# Patient Record
Sex: Female | Born: 1955 | Race: White | Hispanic: No | Marital: Single | Smoking: Former smoker
Health system: Southern US, Community
[De-identification: ages and names within clinical notes are randomized; demographics above are authoritative.]

## PROBLEM LIST (undated history)

## (undated) DIAGNOSIS — I4891 Unspecified atrial fibrillation: Secondary | ICD-10-CM

## (undated) DIAGNOSIS — I2510 Atherosclerotic heart disease of native coronary artery without angina pectoris: Secondary | ICD-10-CM

## (undated) DIAGNOSIS — C801 Malignant (primary) neoplasm, unspecified: Secondary | ICD-10-CM

## (undated) DIAGNOSIS — G4730 Sleep apnea, unspecified: Secondary | ICD-10-CM

## (undated) DIAGNOSIS — G839 Paralytic syndrome, unspecified: Secondary | ICD-10-CM

## (undated) DIAGNOSIS — M21379 Foot drop, unspecified foot: Secondary | ICD-10-CM

## (undated) DIAGNOSIS — I10 Essential (primary) hypertension: Secondary | ICD-10-CM

## (undated) DIAGNOSIS — F419 Anxiety disorder, unspecified: Secondary | ICD-10-CM

## (undated) DIAGNOSIS — Q249 Congenital malformation of heart, unspecified: Secondary | ICD-10-CM

## (undated) DIAGNOSIS — M21372 Foot drop, left foot: Secondary | ICD-10-CM

## (undated) DIAGNOSIS — Z9889 Other specified postprocedural states: Secondary | ICD-10-CM

## (undated) DIAGNOSIS — M1990 Unspecified osteoarthritis, unspecified site: Secondary | ICD-10-CM

## (undated) DIAGNOSIS — R569 Unspecified convulsions: Secondary | ICD-10-CM

## (undated) DIAGNOSIS — I2699 Other pulmonary embolism without acute cor pulmonale: Secondary | ICD-10-CM

## (undated) DIAGNOSIS — I639 Cerebral infarction, unspecified: Secondary | ICD-10-CM

## (undated) DIAGNOSIS — R112 Nausea with vomiting, unspecified: Secondary | ICD-10-CM

## (undated) HISTORY — PX: OPEN REDUCTION INTERNAL FIXATION (ORIF) HAND: SHX5991

## (undated) HISTORY — PX: CERVICAL CONE BIOPSY: SUR198

## (undated) HISTORY — PX: LEG SURGERY: SHX1003

## (undated) HISTORY — DX: Other pulmonary embolism without acute cor pulmonale: I26.99

## (undated) HISTORY — PX: DILATION AND CURETTAGE OF UTERUS: SHX78

## (undated) HISTORY — PX: TONSILLECTOMY: SUR1361

## (undated) HISTORY — PX: BREAST SURGERY: SHX581

## (undated) HISTORY — DX: Unspecified atrial fibrillation: I48.91

---

## 2004-08-10 HISTORY — PX: COLONOSCOPY: SHX174

## 2004-11-11 ENCOUNTER — Ambulatory Visit: Payer: Self-pay | Admitting: Psychiatry

## 2008-03-23 ENCOUNTER — Other Ambulatory Visit: Admission: RE | Admit: 2008-03-23 | Discharge: 2008-03-23 | Payer: Self-pay | Admitting: Obstetrics and Gynecology

## 2011-03-26 ENCOUNTER — Emergency Department (HOSPITAL_COMMUNITY): Payer: Medicare Other

## 2011-03-26 ENCOUNTER — Emergency Department (HOSPITAL_COMMUNITY)
Admission: EM | Admit: 2011-03-26 | Discharge: 2011-03-26 | Disposition: A | Discharge status: Home / Self Care | Payer: Medicare Other | Attending: Emergency Medicine | Admitting: Emergency Medicine

## 2011-03-26 ENCOUNTER — Encounter: Payer: Self-pay | Admitting: Emergency Medicine

## 2011-03-26 DIAGNOSIS — M2450 Contracture, unspecified joint: Secondary | ICD-10-CM | POA: Insufficient documentation

## 2011-03-26 DIAGNOSIS — R51 Headache: Secondary | ICD-10-CM | POA: Insufficient documentation

## 2011-03-26 DIAGNOSIS — Y92009 Unspecified place in unspecified non-institutional (private) residence as the place of occurrence of the external cause: Secondary | ICD-10-CM | POA: Insufficient documentation

## 2011-03-26 DIAGNOSIS — S39012A Strain of muscle, fascia and tendon of lower back, initial encounter: Secondary | ICD-10-CM

## 2011-03-26 DIAGNOSIS — R296 Repeated falls: Secondary | ICD-10-CM | POA: Insufficient documentation

## 2011-03-26 DIAGNOSIS — S335XXA Sprain of ligaments of lumbar spine, initial encounter: Secondary | ICD-10-CM | POA: Insufficient documentation

## 2011-03-26 HISTORY — DX: Unspecified osteoarthritis, unspecified site: M19.90

## 2011-03-26 HISTORY — DX: Essential (primary) hypertension: I10

## 2011-03-26 HISTORY — DX: Cerebral infarction, unspecified: I63.9

## 2011-03-26 HISTORY — DX: Malignant (primary) neoplasm, unspecified: C80.1

## 2011-03-26 HISTORY — DX: Atherosclerotic heart disease of native coronary artery without angina pectoris: I25.10

## 2011-03-26 HISTORY — DX: Unspecified convulsions: R56.9

## 2011-03-26 HISTORY — DX: Sleep apnea, unspecified: G47.30

## 2011-03-26 MED ORDER — OXYCODONE-ACETAMINOPHEN 5-325 MG PO TABS: 1.0000 | ORAL_TABLET | ORAL | Status: AC | PRN

## 2011-03-26 MED ORDER — OXYCODONE-ACETAMINOPHEN 5-325 MG PO TABS
2.0000 | ORAL_TABLET | Freq: Once | ORAL | Status: AC
  Administered 2011-03-26: 2 via ORAL
  Filled 2011-03-26: qty 2

## 2011-03-26 NOTE — ED Notes (Signed)
Pt was turning to talk to roommate, Lost her balance and fell on her buttocks. C/O neck and back pain, hip, shoulder and head. C/O headache.

## 2011-03-26 NOTE — ED Notes (Signed)
Pt sitting in bed. No needs voiced at this time. Awaiting results from xrays.

## 2011-03-26 NOTE — ED Provider Notes (Signed)
History     CSN: [id number] Arrival date & time: 03/26/2011  5:16 PM  Chief Complaint  Patient presents with  . 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 She does use a walker with some success. She lost her balance when turning around abruptly and felt were lower back and buttocks. Due to the pain in her stroke she was unable to get up off the ground and thus called the paramedics the she denies hitting her head, or neck pain.  Patient is a 55 y.o. female presenting with fall. The history is provided by the patient and the EMS personnel.  Fall The accident occurred less than 1 hour ago. The fall occurred while walking (Turned around to reach for the doorknob and slipped and fell). Distance fallen: From standing. She landed on concrete. There was no blood loss. Point of impact: Buttock. Pain location: Lower back. The pain is moderate. She was not ambulatory at the scene. There was no drug use involved in the accident. Associated symptoms include headaches. Pertinent negatives include no fever, no numbness, no abdominal pain, no bowel incontinence, no nausea, no vomiting and no loss of consciousness. Exacerbated by: Palpation and movement. Treatment on scene includes a c-collar and a backboard.    No past medical history on file.  No past surgical history on file.  No family history on file.  History  Substance Use Topics  . Smoking status: Not on file  . Smokeless tobacco: Not on file  . Alcohol Use: Not on file    OB History    Grav Para Term Preterm Abortions TAB SAB Ect Mult Living                  Review of Systems  Constitutional: Negative for fever and chills.  HENT: Negative for sore throat and neck pain.   Eyes: Negative for visual disturbance.  Respiratory: Negative for cough and shortness of breath.   Cardiovascular: Negative for chest pain.    Gastrointestinal: Negative for nausea, vomiting, abdominal pain, diarrhea and bowel incontinence.  Genitourinary: Negative for dysuria and frequency.  Musculoskeletal: Positive for back pain.  Skin: Negative for rash.  Neurological: Positive for headaches. Negative for loss of consciousness, weakness and numbness.  Hematological: Negative for adenopathy.  Psychiatric/Behavioral: Negative for behavioral problems.    Physical Exam  BP 122/59  Pulse 94  Temp(Src) 97.7 F (36.5 C) (Oral)  Resp 20  Ht 5\' 4"  (1.626 m)  Wt 290 lb (131.543 kg)  BMI 49.78 kg/m2  SpO2 94%  Physical Exam  Nursing note and vitals reviewed. Constitutional: She appears well-developed and well-nourished. No distress.  HENT:  Head: Normocephalic and atraumatic.  Mouth/Throat: Oropharynx is clear and moist. No oropharyngeal exudate.  Eyes: Conjunctivae and EOM are normal. Pupils are equal, round, and reactive to light. Right eye exhibits no discharge. Left eye exhibits no discharge. No scleral icterus.  Neck: Normal range of motion. Neck supple. No JVD present. No thyromegaly present.  Cardiovascular: Normal rate, regular rhythm, normal heart sounds and intact distal pulses.  Exam reveals no gallop and no friction rub.   No murmur heard. Pulmonary/Chest: Effort normal and breath sounds normal. No respiratory distress. She has no wheezes. She has no rales.  Abdominal: Soft. Bowel sounds are normal. She exhibits no distension and no mass. There is no tenderness.  Morbidly obese  Musculoskeletal: Normal range of motion. She exhibits tenderness (flexion contracture of the left upper extremity. Tenderness to palpation in the lumbar spine and paraspinal muscles.). She exhibits no edema.  Lymphadenopathy:    She has no cervical adenopathy.  Neurological: She is alert. Coordination normal.       Decreased strength of left upper left lower extremity.  Skin: Skin is warm and dry. No rash noted. No erythema.   Psychiatric: She has a normal mood and affect. Her behavior is normal.    ED Course  Procedures  MDM No signs of significant trauma. His low back after fall to buttocks. We'll rule out lumbar compression fracture with x-rays. Medications given.  Dg Lumbar Spine Complete  03/26/2011  *RADIOLOGY REPORT*  Clinical Data: Fall.  Low back pain.  LUMBAR SPINE - COMPLETE 4+ VIEW  Comparison: None.  Findings: Technical factors related to patient body habitus reduce diagnostic sensitivity and specificity.  Mild facet arthropathy noted bilaterally at L4-L5 and L5-S1.  Spondylosis noted with spurring anterior to the vertebral body column T11-12, T12-L1, and also at L1-2.  No subluxation or fracture noted.  IMPRESSION:  1.  Lumbar spondylosis, without fracture or subluxation identified.  Original Report Authenticated By: Dellia Cloud, M.D.    Results reviewed with pt.  Vida Roller, MD 03/26/11 [phone]

## 2011-03-26 NOTE — ED Notes (Signed)
[facility] called for pt pick up.

## 2011-04-12 ENCOUNTER — Emergency Department (HOSPITAL_COMMUNITY): Payer: Medicare Other

## 2011-04-12 ENCOUNTER — Encounter (HOSPITAL_COMMUNITY): Payer: Self-pay | Admitting: Emergency Medicine

## 2011-04-12 ENCOUNTER — Other Ambulatory Visit: Payer: Self-pay

## 2011-04-12 ENCOUNTER — Emergency Department (HOSPITAL_COMMUNITY)
Admission: EM | Admit: 2011-04-12 | Discharge: 2011-04-12 | Disposition: A | Discharge status: Home / Self Care | Payer: Medicare Other | Attending: Emergency Medicine | Admitting: Emergency Medicine

## 2011-04-12 DIAGNOSIS — R110 Nausea: Secondary | ICD-10-CM | POA: Insufficient documentation

## 2011-04-12 DIAGNOSIS — R059 Cough, unspecified: Secondary | ICD-10-CM | POA: Insufficient documentation

## 2011-04-12 DIAGNOSIS — Z9181 History of falling: Secondary | ICD-10-CM | POA: Insufficient documentation

## 2011-04-12 DIAGNOSIS — Z87891 Personal history of nicotine dependence: Secondary | ICD-10-CM | POA: Insufficient documentation

## 2011-04-12 DIAGNOSIS — I10 Essential (primary) hypertension: Secondary | ICD-10-CM | POA: Insufficient documentation

## 2011-04-12 DIAGNOSIS — Z8673 Personal history of transient ischemic attack (TIA), and cerebral infarction without residual deficits: Secondary | ICD-10-CM | POA: Insufficient documentation

## 2011-04-12 DIAGNOSIS — R05 Cough: Secondary | ICD-10-CM | POA: Insufficient documentation

## 2011-04-12 DIAGNOSIS — I2510 Atherosclerotic heart disease of native coronary artery without angina pectoris: Secondary | ICD-10-CM | POA: Insufficient documentation

## 2011-04-12 DIAGNOSIS — Z859 Personal history of malignant neoplasm, unspecified: Secondary | ICD-10-CM | POA: Insufficient documentation

## 2011-04-12 LAB — DIFFERENTIAL
Eosinophils Absolute: 0.3 10*3/uL (ref 0.0–0.7)
Lymphs Abs: 3.8 10*3/uL (ref 0.7–4.0)
Monocytes Relative: 12 % (ref 3–12)
Neutro Abs: 4.5 10*3/uL (ref 1.7–7.7)
Neutrophils Relative %: 46 % (ref 43–77)

## 2011-04-12 LAB — LIPASE, BLOOD: Lipase: 24 U/L (ref 11–59)

## 2011-04-12 LAB — COMPREHENSIVE METABOLIC PANEL
Alkaline Phosphatase: 149 U/L — ABNORMAL HIGH (ref 39–117)
BUN: 15 mg/dL (ref 6–23)
CO2: 27 mEq/L (ref 19–32)
Chloride: 101 mEq/L (ref 96–112)
Glucose, Bld: 105 mg/dL — ABNORMAL HIGH (ref 70–99)
Potassium: 4.1 mEq/L (ref 3.5–5.1)
Total Bilirubin: 0.2 mg/dL — ABNORMAL LOW (ref 0.3–1.2)

## 2011-04-12 LAB — URINALYSIS, ROUTINE W REFLEX MICROSCOPIC
Bilirubin Urine: NEGATIVE
Ketones, ur: NEGATIVE mg/dL
Nitrite: NEGATIVE
Urobilinogen, UA: 0.2 mg/dL (ref 0.0–1.0)

## 2011-04-12 LAB — CBC
Hemoglobin: 13.4 g/dL (ref 12.0–15.0)
MCH: 32.8 pg (ref 26.0–34.0)
RBC: 4.08 MIL/uL (ref 3.87–5.11)

## 2011-04-12 LAB — URINE MICROSCOPIC-ADD ON

## 2011-04-12 LAB — TROPONIN I: Troponin I: <0.3 ng/mL (ref <0.30)

## 2011-04-12 MED ORDER — ONDANSETRON HCL 4 MG/2ML IJ SOLN
4.0000 mg | INTRAMUSCULAR | Status: DC | PRN
  Administered 2011-04-12: 4 mg via INTRAVENOUS
  Filled 2011-04-12: qty 2

## 2011-04-12 MED ORDER — IOHEXOL 300 MG/ML  SOLN
100.0000 mL | Freq: Once | INTRAMUSCULAR | Status: AC | PRN
  Administered 2011-04-12: 100 mL via INTRAVENOUS

## 2011-04-12 MED ORDER — SODIUM CHLORIDE 0.9 % IV SOLN
INTRAVENOUS | Status: DC
  Administered 2011-04-12: 19:00:00 via INTRAVENOUS

## 2011-04-12 MED ORDER — ONDANSETRON HCL 4 MG PO TABS: 4.0000 mg | ORAL_TABLET | Freq: Four times a day (QID) | ORAL | Status: AC | PRN

## 2011-04-12 NOTE — ED Provider Notes (Signed)
History     CSN: [id number] Arrival date & time: 04/12/2011  5:30 PM  Chief Complaint  Patient presents with  . Nausea   HPI Pt was seen at 1830.  Per pt, c/o gradual onset and persistence of constant nausea that began after eating lunch at approx noon today PTA.  Denies any other symptoms.  Denies abd pain, no vomiting/diarrhea, no back pain, no fevers, no CP/SOB.   Past Medical History  Diagnosis Date  . Stroke   . Seizures   . Coronary artery disease   . Hypertension   . Arthritis   . Sleep apnea   . Cancer     Past Surgical History  Procedure Date  . Leg surgery   . Cervical cone biopsy   . Breast surgery   . Tonsillectomy     Family History  Problem Relation Age of Onset  . Diabetes Mother   . Diabetes Sister   . Cancer Brother   . Cancer Other   . Diabetes Other     History  Substance Use Topics  . Smoking status: Former Smoker    Quit date: 03/25/1981  . Smokeless tobacco: Never Used  . Alcohol Use: No    Review of Systems ROS: Statement: All systems negative except as marked or noted in the HPI; Constitutional: Negative for fever and chills. ; ; Eyes: Negative for eye pain, redness and discharge. ; ; ENMT: Negative for ear pain, hoarseness, nasal congestion, sinus pressure and sore throat. ; ; Cardiovascular: Negative for chest pain, palpitations, diaphoresis, dyspnea and peripheral edema. ; ; Respiratory: Negative for cough, wheezing and stridor. ; ; Gastrointestinal:  +nausea.  Negative for vomiting, diarrhea and abdominal pain, blood in stool, hematemesis, jaundice and rectal bleeding. . ; ; Genitourinary: Negative for dysuria, flank pain and hematuria. ; ; Musculoskeletal: Negative for back pain and neck pain. Negative for swelling and trauma.; ; Skin: Negative for pruritus, rash, abrasions, blisters, bruising and skin lesion.; ; Neuro: Negative for headache, lightheadedness and neck stiffness. Negative for weakness, altered level of consciousness , altered  mental status, extremity weakness, paresthesias, involuntary movement, seizure and syncope.     Physical Exam  BP 157/65  Pulse 98  Temp(Src) 98 F (36.7 C) (Oral)  Resp 20  Ht 5\' 4"  (1.626 m)  Wt 292 lb (132.45 kg)  BMI 50.12 kg/m2  SpO2 95%  Physical Exam 1835: Physical examination:  Nursing notes reviewed; Vital signs and O2 SAT reviewed;  Constitutional: Well developed, Well nourished, In no acute distress; Head:  Normocephalic, atraumatic; Eyes: EOMI, PERRL, No scleral icterus; ENMT: Mouth and pharynx normal, Mucous membranes dry; Neck: Supple, Full range of motion, No lymphadenopathy; Cardiovascular: Regular rate and rhythm, No murmur, rub, or gallop; Respiratory: Breath sounds clear & equal bilaterally, No rales, rhonchi, wheezes, or rub, Normal respiratory effort/excursion; Chest: Nontender, Movement normal; Abdomen: +large abd, soft, +mild mid mid-epigastric tenderness to palp.  No rebound or guarding.  Nondistended, Normal bowel sounds; Genitourinary: No CVA tenderness; Extremities: Pulses normal, No tenderness.; Neuro: AA&Ox3, Major CN grossly intact.  Speech clear.  +LUE and LLE weakness per hx CVA, otherwise no gross focal motor deficits.; Skin: Color normal, Warm, Dry.   ED Course  Procedures  MDM MDM Reviewed: nursing note, vitals and previous chart Interpretation: labs, ECG, x-ray and CT scan    Date: 04/12/2011  Rate: 89  Rhythm: normal sinus rhythm  QRS Axis: left  Intervals: normal  ST/T Wave abnormalities: normal  Conduction Disutrbances:none  Narrative  Interpretation:  LVH  Old EKG Reviewed:  No old EKG to compare.  Results for orders placed during the hospital encounter of 04/12/11  CBC      Component Value Range   WBC 9.9  4.0 - 10.5 (K/uL)   RBC 4.08  3.87 - 5.11 (MIL/uL)   Hemoglobin 13.4  12.0 - 15.0 (g/dL)   HCT 13.0  86.5 - 78.4 (%)   MCV 98.8  78.0 - 100.0 (fL)   MCH 32.8  26.0 - 34.0 (pg)   MCHC 33.3  30.0 - 36.0 (g/dL)   RDW 69.6  29.5 -  28.4 (%)   Platelets 327  150 - 400 (K/uL)  DIFFERENTIAL      Component Value Range   Neutrophils Relative 46  43 - 77 (%)   Neutro Abs 4.5  1.7 - 7.7 (K/uL)   Lymphocytes Relative 39  12 - 46 (%)   Lymphs Abs 3.8  0.7 - 4.0 (K/uL)   Monocytes Relative 12  3 - 12 (%)   Monocytes Absolute 1.2 (*) 0.1 - 1.0 (K/uL)   Eosinophils Relative 3  0 - 5 (%)   Eosinophils Absolute 0.3  0.0 - 0.7 (K/uL)   Basophils Relative 1  0 - 1 (%)   Basophils Absolute 0.1  0.0 - 0.1 (K/uL)  COMPREHENSIVE METABOLIC PANEL      Component Value Range   Sodium 137  135 - 145 (mEq/L)   Potassium 4.1  3.5 - 5.1 (mEq/L)   Chloride 101  96 - 112 (mEq/L)   CO2 27  19 - 32 (mEq/L)   Glucose, Bld 105 (*) 70 - 99 (mg/dL)   BUN 15  6 - 23 (mg/dL)   Creatinine, Ser <1.32 (*) 0.50 - 1.10 (mg/dL)   Calcium 9.4  8.4 - 44.0 (mg/dL)   Total Protein 7.2  6.0 - 8.3 (g/dL)   Albumin 3.8  3.5 - 5.2 (g/dL)   AST 17  0 - 37 (U/L)   ALT 14  0 - 35 (U/L)   Alkaline Phosphatase 149 (*) 39 - 117 (U/L)   Total Bilirubin 0.2 (*) 0.3 - 1.2 (mg/dL)   GFR calc non Af Amer NOT CALCULATED  >60 (mL/min)   GFR calc Af Amer NOT CALCULATED  >60 (mL/min)  LIPASE, BLOOD      Component Value Range   Lipase 24  11 - 59 (U/L)  URINALYSIS, ROUTINE W REFLEX MICROSCOPIC      Component Value Range   Color, Urine YELLOW  YELLOW    Appearance CLEAR  CLEAR    Specific Gravity, Urine >1.030 (*) 1.005 - 1.030    pH 5.5  5.0 - 8.0    Glucose, UA NEGATIVE  NEGATIVE (mg/dL)   Hgb urine dipstick TRACE (*) NEGATIVE    Bilirubin Urine NEGATIVE  NEGATIVE    Ketones, ur NEGATIVE  NEGATIVE (mg/dL)   Protein, ur NEGATIVE  NEGATIVE (mg/dL)   Urobilinogen, UA 0.2  0.0 - 1.0 (mg/dL)   Nitrite NEGATIVE  NEGATIVE    Leukocytes, UA NEGATIVE  NEGATIVE   TROPONIN I      Component Value Range   Troponin I <0.30  <0.30 (ng/mL)  URINE MICROSCOPIC-ADD ON      Component Value Range   Squamous Epithelial / LPF FEW (*) RARE    WBC, UA 3-6  <3 (WBC/hpf)   RBC /  HPF 7-10  <3 (RBC/hpf)   Bacteria, UA FEW (*) RARE    UAdip contaminated, UC pending.  Dg Chest 1 View  04/12/2011  *RADIOLOGY REPORT*  Clinical Data: Cough and weakness.  CHEST - 1 VIEW  Comparison: None.  Findings: Shallow inspiration.  Normal heart size and pulmonary vascularity.  Tortuous aorta.  No focal airspace consolidation in the lungs.  No blunting of costophrenic angles.  No pneumothorax.  IMPRESSION: No evidence of active pulmonary disease.  Original Report Authenticated By: Marlon Pel, M.D.   Ct Abdomen Pelvis W Contrast  04/12/2011  *RADIOLOGY REPORT*  Clinical Data: Nausea and vomiting  CT ABDOMEN AND PELVIS WITH CONTRAST  Technique:  Multidetector CT imaging of the abdomen and pelvis was performed following the standard protocol during bolus administration of intravenous contrast.  Contrast: 100 ml Omnipaque 300  Comparison: None.  Findings: Minimal dependent changes in the lung bases.  Small calcified granuloma on the right.  There is a large stone in the gallbladder without gallbladder wall thickening.  No bile duct dilatation.  Small calcified granuloma in the liver.  Liver and spleen are otherwise unremarkable.  Pancreas is unremarkable.  No adrenal gland nodules.  Stomach and small bowel are not distended.  Calcification of the aorta without aneurysm.  Intrarenal stones in both kidneys without hydronephrosis or ureterectasis.  No free air or free fluid in the abdomen.  No retroperitoneal lymphadenopathy.  Pelvis:  The uterus and adnexal structures are not enlarged.  The bladder wall is not thickened.  No free or loculated pelvic fluid collections.  No inflammatory changes in the colon.  The appendix is normal.  Degenerative changes in the lumbar spine with normal alignment.  There appear to be healing fractures of the left transverse processes of L2 and L3.  IMPRESSION: No acute inflammatory process demonstrated.  Cholelithiasis. Nonobstructing bilateral renal stones.  Healing  fractures of the left transverse processes of L2 and L3.  Original Report Authenticated By: Marlon Pel, M.D.    9:56 PM:  No N/V while in ED.  Tol PO well while in ED without N/V.  Wants to go home now.  UAdip appears contaminated, UC pending.  Pt denies dysuria, VSS, afebrile, normal WBC.  Dx testing d/w pt.  Questions answered.  Verb understanding, agreeable to d/c home with outpt f/u.   Paulina Muchmore Allison Quarry, DO 04/15/11 1301

## 2011-04-12 NOTE — ED Notes (Signed)
C/o nausea that began around noon today---no actual vomiting, no abdominal pain

## 2011-04-12 NOTE — ED Notes (Signed)
Lying on carrier---no vomiting since arrival.  Side Rails up--voices no other complaints

## 2011-04-12 NOTE — ED Notes (Signed)
Iv started Right hand with 22 gauge--infusing N.S. At approx. 75 ml/hr--Zofran 4 mg IVP--Pt. Tolerated well

## 2011-04-14 ENCOUNTER — Encounter (HOSPITAL_COMMUNITY): Payer: Self-pay

## 2011-04-14 ENCOUNTER — Emergency Department (HOSPITAL_COMMUNITY): Payer: Medicare Other

## 2011-04-14 ENCOUNTER — Emergency Department (HOSPITAL_COMMUNITY)
Admission: EM | Admit: 2011-04-14 | Discharge: 2011-04-14 | Disposition: A | Discharge status: Skilled Nursing Facility | Payer: Medicare Other | Attending: Emergency Medicine | Admitting: Emergency Medicine

## 2011-04-14 DIAGNOSIS — S7002XA Contusion of left hip, initial encounter: Secondary | ICD-10-CM

## 2011-04-14 DIAGNOSIS — I2510 Atherosclerotic heart disease of native coronary artery without angina pectoris: Secondary | ICD-10-CM | POA: Insufficient documentation

## 2011-04-14 DIAGNOSIS — M1990 Unspecified osteoarthritis, unspecified site: Secondary | ICD-10-CM | POA: Insufficient documentation

## 2011-04-14 DIAGNOSIS — W010XXA Fall on same level from slipping, tripping and stumbling without subsequent striking against object, initial encounter: Secondary | ICD-10-CM | POA: Insufficient documentation

## 2011-04-14 DIAGNOSIS — S7000XA Contusion of unspecified hip, initial encounter: Secondary | ICD-10-CM | POA: Insufficient documentation

## 2011-04-14 DIAGNOSIS — Y9210 Unspecified residential institution as the place of occurrence of the external cause: Secondary | ICD-10-CM | POA: Insufficient documentation

## 2011-04-14 DIAGNOSIS — Z8673 Personal history of transient ischemic attack (TIA), and cerebral infarction without residual deficits: Secondary | ICD-10-CM | POA: Insufficient documentation

## 2011-04-14 DIAGNOSIS — W19XXXA Unspecified fall, initial encounter: Secondary | ICD-10-CM

## 2011-04-14 DIAGNOSIS — Z859 Personal history of malignant neoplasm, unspecified: Secondary | ICD-10-CM | POA: Insufficient documentation

## 2011-04-14 DIAGNOSIS — Z87891 Personal history of nicotine dependence: Secondary | ICD-10-CM | POA: Insufficient documentation

## 2011-04-14 LAB — URINE CULTURE

## 2011-04-14 MED ORDER — MORPHINE SULFATE 4 MG/ML IJ SOLN
4.0000 mg | Freq: Once | INTRAMUSCULAR | Status: AC
  Administered 2011-04-14: 4 mg via INTRAMUSCULAR
  Filled 2011-04-14: qty 1

## 2011-04-14 NOTE — ED Notes (Signed)
Patient with no complaints at this time. Respirations even and unlabored. Skin warm/dry. Discharge instructions reviewed with patient at this time. Patient given opportunity to voice concerns/ask questions. Patient discharged at this time and left Emergency Department with steady gait.   

## 2011-04-14 NOTE — ED Notes (Signed)
Spoke with Selena Batten, patient's nurse at [facility]. Update on patient given. Plan to discharge patient home per Dr. Adriana Simas. Transport service is available and will be on the way to get patient.

## 2011-04-14 NOTE — ED Provider Notes (Signed)
History     CSN: [id number] Arrival date & time: 04/14/2011  5:33 AM  Chief Complaint  Patient presents with  . Fall   HPI Comments: Patient is a pleasant 55 year old female who presents after a fall at her assisted care facility. She states that she accidentally slipped in some liquid on the floor and landed on her left hip. She denies head injury or neck or back pain. EMS transported with backboard and c-collar.  Patient is a 55 y.o. female presenting with fall. The history is provided by the patient, medical records and the EMS personnel.  Fall The accident occurred less than 1 hour ago. The fall occurred while walking. Distance fallen: standing. She landed on a hard floor. There was no blood loss. The point of impact was the left hip. The pain is present in the left hip. The pain is moderate. She was not ambulatory at the scene. Entrapment: unable to get up and walk. There was no drug use involved in the accident. There was no alcohol use involved in the accident. Pertinent negatives include no visual change, no fever, no numbness, no abdominal pain, no nausea, no vomiting, no headaches, no loss of consciousness and no tingling. Exacerbated by: movement of L leg. Treatment on scene includes a c-collar and a backboard. She has tried nothing for the symptoms.    Past Medical History  Diagnosis Date  . Stroke   . Seizures   . Coronary artery disease   . Hypertension   . Arthritis   . Sleep apnea   . Cancer     Past Surgical History  Procedure Date  . Leg surgery   . Cervical cone biopsy   . Breast surgery   . Tonsillectomy     Family History  Problem Relation Age of Onset  . Diabetes Mother   . Diabetes Sister   . Cancer Brother   . Cancer Other   . Diabetes Other     History  Substance Use Topics  . Smoking status: Former Smoker    Quit date: 03/25/1981  . Smokeless tobacco: Never Used  . Alcohol Use: No    OB History    Grav Para Term Preterm Abortions TAB SAB  Ect Mult Living   3 2 2  1  1          Review of Systems  Constitutional: Negative for fever.  Gastrointestinal: Negative for nausea, vomiting and abdominal pain.  Musculoskeletal: Positive for gait problem. Negative for back pain.  Neurological: Negative for tingling, loss of consciousness, numbness and headaches.    Physical Exam  BP 122/61  Pulse 76  Temp(Src) 98.4 F (36.9 C) (Oral)  Resp 20  Ht 5\' 4"  (1.626 m)  Wt 292 lb (132.45 kg)  BMI 50.12 kg/m2  SpO2 98%  Physical Exam  Nursing note and vitals reviewed. Constitutional: She appears well-developed and well-nourished. No distress (uncomfortable appearing).  HENT:  Head: Normocephalic and atraumatic.  Mouth/Throat: Oropharynx is clear and moist. No oropharyngeal exudate.  Eyes: Conjunctivae and EOM are normal. Pupils are equal, round, and reactive to light. Right eye exhibits no discharge. Left eye exhibits no discharge. No scleral icterus.  Neck: Normal range of motion. Neck supple. No JVD present. No thyromegaly present.  Cardiovascular: Normal rate, regular rhythm, normal heart sounds and intact distal pulses.  Exam reveals no gallop and no friction rub.   No murmur heard. Pulmonary/Chest: Effort normal and breath sounds normal. No respiratory distress. She has no wheezes. She  has no rales.  Abdominal: Soft. Bowel sounds are normal. She exhibits no distension and no mass. There is no tenderness.  Musculoskeletal:       Right lower extremity and right upper extremity with normal exams including range of motion and no tenderness. Left upper extremity with some weakness and inability to use the hand. Left lower extremity with mild edema to the foot, ankle, lower extremity. She has decreased range of motion of the ankle secondary to pain, no pain in the knee with range of motion but pain in the hip with internal and external rotation. She is able to self straight leg raise the left leg with minimal difficulty. Tenderness over  the bursa on the left hip.  Lymphadenopathy:    She has no cervical adenopathy.  Neurological: She is alert. Coordination normal.       Weakness of the left upper extremity mild weakness of the left lower extremity.  Skin: Skin is warm and dry. No rash noted. No erythema.  Psychiatric: She has a normal mood and affect. Her behavior is normal.    ED Course  Procedures  MDM Review of medical records shows the patient had a fall approximately 3 weeks ago and negative imaging at that time. She is followed again tonight it appears to be a mechanical fall by history. She has tenderness to the left hip and will be imaging to rule out acute fracture.  Xray equivocal given rotation - CT ordered, and change of shift - care signed out to Dr. Tera Helper, MD 04/14/11 [phone]

## 2011-04-14 NOTE — ED Notes (Signed)
Patient assisted with bedside commode. Assist x 2. Patient able to void without difficulty. Patient assisted back to bed. Side rails up x 2. Patient denies any other needs at this time. Awaiting CT of hip.

## 2011-04-14 NOTE — ED Notes (Signed)
Report received from [facility], [place]. Patient currently in x ray.

## 2011-04-14 NOTE — ED Notes (Signed)
[facility], slipped and fell on urine, now having left hip pain, brought by ems, lsb and c-collar

## 2011-04-17 ENCOUNTER — Other Ambulatory Visit (HOSPITAL_COMMUNITY): Payer: Self-pay | Admitting: Internal Medicine

## 2011-04-17 DIAGNOSIS — R609 Edema, unspecified: Secondary | ICD-10-CM

## 2011-04-20 ENCOUNTER — Ambulatory Visit (HOSPITAL_COMMUNITY)
Admission: RE | Admit: 2011-04-20 | Discharge: 2011-04-20 | Disposition: A | Discharge status: Home / Self Care | Payer: Medicare Other | Source: Ambulatory Visit | Attending: Internal Medicine | Admitting: Internal Medicine

## 2011-04-20 DIAGNOSIS — M7989 Other specified soft tissue disorders: Secondary | ICD-10-CM | POA: Insufficient documentation

## 2011-04-20 DIAGNOSIS — R609 Edema, unspecified: Secondary | ICD-10-CM

## 2011-04-20 DIAGNOSIS — M25569 Pain in unspecified knee: Secondary | ICD-10-CM | POA: Insufficient documentation

## 2011-07-14 ENCOUNTER — Emergency Department (HOSPITAL_COMMUNITY)
Admission: EM | Admit: 2011-07-14 | Discharge: 2011-07-14 | Disposition: A | Discharge status: Home / Self Care | Payer: Medicare Other | Attending: Emergency Medicine | Admitting: Emergency Medicine

## 2011-07-14 ENCOUNTER — Emergency Department (HOSPITAL_COMMUNITY): Payer: Medicare Other

## 2011-07-14 ENCOUNTER — Encounter (HOSPITAL_COMMUNITY): Payer: Self-pay

## 2011-07-14 DIAGNOSIS — I10 Essential (primary) hypertension: Secondary | ICD-10-CM | POA: Insufficient documentation

## 2011-07-14 DIAGNOSIS — Z7982 Long term (current) use of aspirin: Secondary | ICD-10-CM | POA: Insufficient documentation

## 2011-07-14 DIAGNOSIS — J069 Acute upper respiratory infection, unspecified: Secondary | ICD-10-CM | POA: Insufficient documentation

## 2011-07-14 DIAGNOSIS — J40 Bronchitis, not specified as acute or chronic: Secondary | ICD-10-CM | POA: Insufficient documentation

## 2011-07-14 DIAGNOSIS — Z87891 Personal history of nicotine dependence: Secondary | ICD-10-CM | POA: Insufficient documentation

## 2011-07-14 DIAGNOSIS — G4730 Sleep apnea, unspecified: Secondary | ICD-10-CM | POA: Insufficient documentation

## 2011-07-14 DIAGNOSIS — I2510 Atherosclerotic heart disease of native coronary artery without angina pectoris: Secondary | ICD-10-CM | POA: Insufficient documentation

## 2011-07-14 DIAGNOSIS — Z8673 Personal history of transient ischemic attack (TIA), and cerebral infarction without residual deficits: Secondary | ICD-10-CM | POA: Insufficient documentation

## 2011-07-14 DIAGNOSIS — M129 Arthropathy, unspecified: Secondary | ICD-10-CM | POA: Insufficient documentation

## 2011-07-14 DIAGNOSIS — Z859 Personal history of malignant neoplasm, unspecified: Secondary | ICD-10-CM | POA: Insufficient documentation

## 2011-07-14 LAB — RAPID STREP SCREEN (MED CTR MEBANE ONLY): Streptococcus, Group A Screen (Direct): NEGATIVE

## 2011-07-14 MED ORDER — HYDROCOD POLST-CHLORPHEN POLST 10-8 MG/5ML PO LQCR: ORAL | Status: DC

## 2011-07-14 MED ORDER — CEPHALEXIN 500 MG PO CAPS
500.0000 mg | ORAL_CAPSULE | Freq: Once | ORAL | Status: AC
  Administered 2011-07-14: 500 mg via ORAL
  Filled 2011-07-14: qty 1

## 2011-07-14 MED ORDER — CEPHALEXIN 500 MG PO CAPS: 500.0000 mg | ORAL_CAPSULE | Freq: Four times a day (QID) | ORAL | Status: AC

## 2011-07-14 MED ORDER — HYDROCOD POLST-CHLORPHEN POLST 10-8 MG/5ML PO LQCR
5.0000 mL | Freq: Once | ORAL | Status: AC
  Administered 2011-07-14: 5 mL via ORAL
  Filled 2011-07-14: qty 5

## 2011-07-14 NOTE — ED Provider Notes (Signed)
History     CSN: [id number] Arrival date & time: 07/14/2011 10:43 AM   None     Chief Complaint  Patient presents with  . Cough    (Consider location/radiation/quality/duration/timing/severity/associated sxs/prior treatment) Patient is a 55 y.o. female presenting with cough. The history is provided by the patient.  Cough This is a new problem. The current episode started more than 1 week ago. The problem occurs hourly. The problem has not changed since onset.The cough is productive of sputum. There has been no fever. Associated symptoms include chest pain, chills, headaches, sore throat and myalgias. Pertinent negatives include no shortness of breath and no wheezing.    Past Medical History  Diagnosis Date  . Stroke   . Seizures   . Coronary artery disease   . Hypertension   . Arthritis   . Sleep apnea   . Cancer     Past Surgical History  Procedure Date  . Leg surgery   . Cervical cone biopsy   . Breast surgery   . Tonsillectomy   . Leg surgery     left    Family History  Problem Relation Age of Onset  . Diabetes Mother   . Diabetes Sister   . Cancer Brother   . Cancer Other   . Diabetes Other     History  Substance Use Topics  . Smoking status: Former Smoker    Quit date: 03/25/1981  . Smokeless tobacco: Never Used  . Alcohol Use: No    OB History    Grav Para Term Preterm Abortions TAB SAB Ect Mult Living   3 2 2  1  1          Review of Systems  Constitutional: Positive for chills. Negative for activity change.       All ROS Neg except as noted in HPI  HENT: Positive for sore throat. Negative for nosebleeds and neck pain.   Eyes: Negative for photophobia and discharge.  Respiratory: Positive for cough. Negative for shortness of breath and wheezing.   Cardiovascular: Positive for chest pain. Negative for palpitations.  Gastrointestinal: Negative for abdominal pain and blood in stool.  Genitourinary: Negative for dysuria, frequency and  hematuria.  Musculoskeletal: Positive for myalgias. Negative for back pain and arthralgias.  Skin: Negative.   Neurological: Positive for headaches. Negative for dizziness, seizures and speech difficulty.  Psychiatric/Behavioral: Negative for hallucinations and confusion.    Allergies  Latex and Other  Home Medications   Current Outpatient Rx  Name Route Sig Dispense Refill  . ASPIRIN EC 325 MG PO TBEC Oral Take 325 mg by mouth daily.      . CHOLECALCIFEROL 400 UNITS PO TABS Oral Take 200 Units by mouth daily.      Marland Kitchen DILTIAZEM HCL ER COATED BEADS 300 MG PO CP24 Oral Take 300 mg by mouth daily.      Marland Kitchen DOCUSATE SODIUM 100 MG PO CAPS Oral Take 100 mg by mouth daily.      . DULOXETINE HCL 60 MG PO CPEP Oral Take 60 mg by mouth daily.      Marland Kitchen FLUTICASONE PROPIONATE 50 MCG/ACT NA SUSP Nasal Place 1 spray into the nose every morning.      Marland Kitchen GABAPENTIN 300 MG PO CAPS Oral Take 300 mg by mouth 3 (three) times daily.      . IBUPROFEN 200 MG PO TABS Oral Take 200 mg by mouth 3 (three) times daily as needed. Pain     . LAMOTRIGINE 25  MG PO TABS Oral Take 25 mg by mouth 2 (two) times daily.      Marland Kitchen LISINOPRIL 40 MG PO TABS Oral Take 40 mg by mouth daily.      Marland Kitchen LORATADINE 10 MG PO TABS Oral Take 10 mg by mouth daily.      Marland Kitchen LORAZEPAM 1 MG PO TABS Oral Take 1 mg by mouth 3 (three) times daily as needed. Anxiety    . LOVASTATIN 20 MG PO TABS Oral Take 20 mg by mouth at bedtime.      . OMEPRAZOLE 20 MG PO CPDR Oral Take 20 mg by mouth daily.      Marland Kitchen ONDANSETRON HCL 4 MG PO TABS Oral Take 4 mg by mouth every 8 (eight) hours as needed. Nausea/vomiting     . PHENYTOIN SODIUM EXTENDED 100 MG PO CAPS Oral Take 200-300 mg by mouth 2 (two) times daily. *Take 2 capsules in the morning and 3 capsules at bedtime*    . CALCIUM POLYCARBOPHIL 625 MG PO TABS Oral Take 625 mg by mouth daily as needed. constipation    . SERTRALINE HCL 100 MG PO TABS Oral Take 100 mg by mouth every morning.        BP 106/54  Pulse 75   Temp(Src) 98.3 F (36.8 C) (Oral)  Resp 20  Ht 5\' 3"  (1.6 m)  Wt 280 lb (127.007 kg)  BMI 49.60 kg/m2  SpO2 99%  Physical Exam  Nursing note and vitals reviewed. Constitutional: She is oriented to person, place, and time. She appears well-developed and well-nourished.  Non-toxic appearance.  HENT:  Head: Normocephalic.  Right Ear: Tympanic membrane and external ear normal.  Left Ear: Tympanic membrane and external ear normal.       Nasal congestion. Mild increase redness of the posterior pharynx.  Eyes: EOM and lids are normal. Pupils are equal, round, and reactive to light.  Neck: Normal range of motion. Neck supple. Carotid bruit is not present.  Cardiovascular: Normal rate, regular rhythm, normal heart sounds, intact distal pulses and normal pulses.   Pulmonary/Chest: No respiratory distress. She has no wheezes. She has no rales. She exhibits tenderness.       Anterior chest wall soreness. Course breath sounds. No focal consolidation.  Abdominal: Soft. Bowel sounds are normal. There is no tenderness. There is no guarding.  Musculoskeletal: Normal range of motion.  Lymphadenopathy:       Head (right side): No submandibular adenopathy present.       Head (left side): No submandibular adenopathy present.    She has no cervical adenopathy.  Neurological: She is alert and oriented to person, place, and time. She has normal strength. No cranial nerve deficit or sensory deficit.  Skin: Skin is warm and dry.  Psychiatric: She has a normal mood and affect. Her speech is normal.    ED Course  Procedures (including critical care time)   Labs Reviewed  RAPID STREP SCREEN   Dg Chest 2 View  07/14/2011  *RADIOLOGY REPORT*  Clinical Data: Chest pain, chills, cough  CHEST - 2 VIEW  Comparison: 04/12/2011  Findings: Degenerative changes of thoracic spine are noted. Borderline cardiomegaly again noted.  No acute infiltrate or pleural effusion.  No pulmonary edema.  Mild elevation of the  right hemidiaphragm again noted.  IMPRESSION: .  No active disease.  No significant change.  Original Report Authenticated By: Natasha Mead, M.D.     Dx: Bronchitis  2. URI   MDM  I have reviewed  nursing notes, vital signs, and all appropriate lab and imaging results for this patient.        Kathie Dike, [place] 07/14/11 1119

## 2011-07-14 NOTE — ED Notes (Signed)
Pt c/o productive cough with sore throat that started around 1 week prior. Pt has been taking Robitussin

## 2011-07-14 NOTE — ED Notes (Signed)
Pt discharged, SNF contacted for pt pickup.

## 2011-07-15 NOTE — ED Provider Notes (Signed)
Medical screening examination/treatment/procedure(s) were performed by non-physician practitioner and as supervising physician I was immediately available for consultation/collaboration.  Nicoletta Dress. Colon Branch, MD 07/15/11 [phone]

## 2011-08-28 ENCOUNTER — Encounter (HOSPITAL_COMMUNITY): Payer: Self-pay | Admitting: *Deleted

## 2011-08-28 ENCOUNTER — Emergency Department (HOSPITAL_COMMUNITY)
Admission: EM | Admit: 2011-08-28 | Discharge: 2011-08-29 | Disposition: A | Discharge status: Home / Self Care | Payer: Medicare Other | Attending: Emergency Medicine | Admitting: Emergency Medicine

## 2011-08-28 DIAGNOSIS — T169XXA Foreign body in ear, unspecified ear, initial encounter: Secondary | ICD-10-CM

## 2011-08-28 DIAGNOSIS — G40909 Epilepsy, unspecified, not intractable, without status epilepticus: Secondary | ICD-10-CM | POA: Insufficient documentation

## 2011-08-28 DIAGNOSIS — IMO0002 Reserved for concepts with insufficient information to code with codable children: Secondary | ICD-10-CM | POA: Insufficient documentation

## 2011-08-28 DIAGNOSIS — I2510 Atherosclerotic heart disease of native coronary artery without angina pectoris: Secondary | ICD-10-CM | POA: Insufficient documentation

## 2011-08-28 DIAGNOSIS — B372 Candidiasis of skin and nail: Secondary | ICD-10-CM | POA: Insufficient documentation

## 2011-08-28 DIAGNOSIS — I10 Essential (primary) hypertension: Secondary | ICD-10-CM | POA: Insufficient documentation

## 2011-08-28 DIAGNOSIS — Z8673 Personal history of transient ischemic attack (TIA), and cerebral infarction without residual deficits: Secondary | ICD-10-CM | POA: Insufficient documentation

## 2011-08-28 DIAGNOSIS — Z7982 Long term (current) use of aspirin: Secondary | ICD-10-CM | POA: Insufficient documentation

## 2011-08-28 DIAGNOSIS — R21 Rash and other nonspecific skin eruption: Secondary | ICD-10-CM

## 2011-08-28 DIAGNOSIS — H9209 Otalgia, unspecified ear: Secondary | ICD-10-CM | POA: Insufficient documentation

## 2011-08-28 DIAGNOSIS — Z79899 Other long term (current) drug therapy: Secondary | ICD-10-CM | POA: Insufficient documentation

## 2011-08-28 DIAGNOSIS — M129 Arthropathy, unspecified: Secondary | ICD-10-CM | POA: Insufficient documentation

## 2011-08-28 MED ORDER — ANTIPYRINE-BENZOCAINE 5.4-1.4 % OT SOLN
3.0000 [drp] | Freq: Once | OTIC | Status: AC
  Administered 2011-08-29: 4 [drp] via OTIC
  Filled 2011-08-28: qty 10

## 2011-08-28 NOTE — ED Notes (Signed)
Pt reports getting a small piece of paper stuck in her left ear. Pt states that she stuck the paper in there in order to block out the noise. Pt also c/o a rash under her left breast. Pt reports pain 7/10 stating that she has a headache and dizziness.

## 2011-08-28 NOTE — ED Notes (Signed)
Pt put paper in left ear because it was too noisy. Pt also is getting a rash under left breast.

## 2011-08-28 NOTE — ED Provider Notes (Signed)
History     CSN: [id number]  Arrival date & time 08/28/11  2300   First MD Initiated Contact with Patient 08/28/11 2302      Chief Complaint  Patient presents with  . Foreign Body in Ear  . Rash    (Consider location/radiation/quality/duration/timing/severity/associated sxs/prior treatment) The history is provided by the patient.  Left ear foreign body. Patient lives in an assisted-living facility in about a week ago she put paper in her ears because she states the dining room is very noisy there is her. She has been unable to get the paper out and now is causing discomfort with foreign body sensation. No discharge. No pain in the ear or behind her ear. no difficulty opening her jaw. No fevers or vomiting.no history of same. No radiation of "discomfort".    Patient also complaining of a red irritated rash under her left breast. History of same. No pain. No trauma.  Past Medical History  Diagnosis Date  . Stroke   . Seizures   . Coronary artery disease   . Hypertension   . Arthritis   . Sleep apnea   . Cancer     Past Surgical History  Procedure Date  . Leg surgery   . Cervical cone biopsy   . Breast surgery   . Tonsillectomy   . Leg surgery     left    Family History  Problem Relation Age of Onset  . Diabetes Mother   . Diabetes Sister   . Cancer Brother   . Cancer Other   . Diabetes Other     History  Substance Use Topics  . Smoking status: Former Smoker    Quit date: 03/25/1981  . Smokeless tobacco: Never Used  . Alcohol Use: No    OB History    Grav Para Term Preterm Abortions TAB SAB Ect Mult Living   3 2 2  1  1          Review of Systems  Constitutional: Negative for fever and chills.  HENT: Negative for hearing loss, nosebleeds, congestion, sore throat, rhinorrhea, trouble swallowing, neck pain, neck stiffness, voice change, tinnitus and ear discharge.   Eyes: Negative for pain.  Respiratory: Negative for shortness of breath.     Cardiovascular: Negative for chest pain, palpitations and leg swelling.  Gastrointestinal: Negative for vomiting and abdominal pain.  Genitourinary: Negative for dysuria.  Musculoskeletal: Negative for back pain.  Skin: Positive for rash.  Neurological: Negative for headaches.  All other systems reviewed and are negative.    Allergies  Latex and Other  Home Medications   Current Outpatient Rx  Name Route Sig Dispense Refill  . ASPIRIN EC 325 MG PO TBEC Oral Take 325 mg by mouth daily.      Marland Kitchen HYDROCOD POLST-CPM POLST ER 10-8 MG/5ML PO LQCR  5ml po q12h for cough/congestion 120 mL 0  . CHOLECALCIFEROL 400 UNITS PO TABS Oral Take 200 Units by mouth daily.      Marland Kitchen DILTIAZEM HCL ER COATED BEADS 300 MG PO CP24 Oral Take 300 mg by mouth daily.      Marland Kitchen DOCUSATE SODIUM 100 MG PO CAPS Oral Take 100 mg by mouth daily.      . DULOXETINE HCL 60 MG PO CPEP Oral Take 60 mg by mouth daily.      Marland Kitchen FLUTICASONE PROPIONATE 50 MCG/ACT NA SUSP Nasal Place 1 spray into the nose every morning.      Marland Kitchen GABAPENTIN 300 MG PO CAPS  Oral Take 300 mg by mouth 3 (three) times daily.      . IBUPROFEN 200 MG PO TABS Oral Take 200 mg by mouth 3 (three) times daily as needed. Pain     . LAMOTRIGINE 25 MG PO TABS Oral Take 25 mg by mouth 2 (two) times daily.      Marland Kitchen LISINOPRIL 40 MG PO TABS Oral Take 40 mg by mouth daily.      Marland Kitchen LORATADINE 10 MG PO TABS Oral Take 10 mg by mouth daily.      Marland Kitchen LORAZEPAM 1 MG PO TABS Oral Take 1 mg by mouth 3 (three) times daily as needed. Anxiety    . LOVASTATIN 20 MG PO TABS Oral Take 20 mg by mouth at bedtime.      . OMEPRAZOLE 20 MG PO CPDR Oral Take 20 mg by mouth daily.      Marland Kitchen ONDANSETRON HCL 4 MG PO TABS Oral Take 4 mg by mouth every 8 (eight) hours as needed. Nausea/vomiting     . PHENYTOIN SODIUM EXTENDED 100 MG PO CAPS Oral Take 200-300 mg by mouth 2 (two) times daily. *Take 2 capsules in the morning and 3 capsules at bedtime*    . CALCIUM POLYCARBOPHIL 625 MG PO TABS Oral Take  625 mg by mouth daily as needed. constipation    . SERTRALINE HCL 100 MG PO TABS Oral Take 100 mg by mouth every morning.        BP 137/75  Pulse 74  Temp(Src) 98.4 F (36.9 C) (Oral)  Resp 18  Ht 5\' 3"  (1.6 m)  Wt 260 lb (117.935 kg)  BMI 46.06 kg/m2  SpO2 99%  Physical Exam  Constitutional: She is oriented to person, place, and time. She appears well-developed and well-nourished.  HENT:  Head: Normocephalic and atraumatic.  Right Ear: External ear normal.  Mouth/Throat: Oropharynx is clear and moist.       Left ear canal with foreign body visualized consistent with history of rolled up piece of paper.no tenderness to oracle and no mastoid tenderness. No erythema or discharge.  Eyes: Conjunctivae and EOM are normal. Pupils are equal, round, and reactive to light.  Neck: Trachea normal. Neck supple. No thyromegaly present.  Cardiovascular: Normal rate, regular rhythm, S1 normal, S2 normal and normal pulses.     No systolic murmur is present   No diastolic murmur is present  Pulses:      Radial pulses are 2+ on the right side, and 2+ on the left side.  Pulmonary/Chest: Effort normal and breath sounds normal. She has no wheezes. She has no rhonchi. She has no rales.       Erythematous rash under left breast with some satellite lesions consistent with candidal infection. No petechiae.  Abdominal: Soft. Normal appearance and bowel sounds are normal. There is no tenderness. There is no CVA tenderness and negative Murphy's sign.  Musculoskeletal:       Moves all extremities x4 no deficits.  Neurological: She is alert and oriented to person, place, and time. She has normal strength. No cranial nerve deficit or sensory deficit. GCS eye subscore is 4. GCS verbal subscore is 5. GCS motor subscore is 6.  Skin: Skin is warm and dry. No rash noted. She is not diaphoretic.  Psychiatric: Her speech is normal.       Cooperative and appropriate    ED Course  FOREIGN BODY REMOVAL Date/Time:  08/28/2011 11:39 PM Performed by: Sunnie Nielsen Authorized by: Sunnie Nielsen Consent:  Verbal consent obtained. Risks and benefits: risks, benefits and alternatives were discussed Consent given by: patient Patient understanding: patient states understanding of the procedure being performed Patient consent: the patient's understanding of the procedure matches consent given Procedure consent: procedure consent matches procedure scheduled Required items: required blood products, implants, devices, and special equipment available Patient identity confirmed: verbally with patient Time out: Immediately prior to procedure a "time out" was called to verify the correct patient, procedure, equipment, support staff and site/side marked as required. Body area: ear Location details: left ear Patient cooperative: yes Localization method: visualized Removal mechanism: alligator forceps Complexity: complex 2 objects recovered. Objects recovered: paper Post-procedure assessment: foreign body removed Patient tolerance: Patient tolerated the procedure well with no immediate complications. Comments: Very mild irritation to canal without active bleeding, TM intact. Hearing intact   (including critical care time)   Diagnosis 1. Foreign body left ear canal removed.  Diagnosis 2. Candidal infection under left breast   MDM   FB removed as above.. Patient states understanding of any paper work than anything else in her ear. Around and provided as needed for any discomfort. No evidence of otitis media or externa. Patient also has rash on her left breast. Plan treatment for candidal infection with nystatin. Patient has followup with PCP as needed        Sunnie Nielsen, MD 08/29/11 0003

## 2011-08-29 MED ORDER — NYSTATIN 100000 UNIT/GM EX CREA: TOPICAL_CREAM | CUTANEOUS | Status: DC

## 2011-09-17 ENCOUNTER — Encounter (HOSPITAL_COMMUNITY): Payer: Self-pay | Admitting: Emergency Medicine

## 2011-09-17 ENCOUNTER — Emergency Department (HOSPITAL_COMMUNITY)
Admission: EM | Admit: 2011-09-17 | Discharge: 2011-09-18 | Disposition: A | Discharge status: Other Acute Inpatient Hospital | Payer: Medicare Other | Attending: Emergency Medicine | Admitting: Emergency Medicine

## 2011-09-17 ENCOUNTER — Emergency Department (HOSPITAL_COMMUNITY): Payer: Medicare Other

## 2011-09-17 DIAGNOSIS — E669 Obesity, unspecified: Secondary | ICD-10-CM | POA: Insufficient documentation

## 2011-09-17 DIAGNOSIS — W06XXXA Fall from bed, initial encounter: Secondary | ICD-10-CM | POA: Insufficient documentation

## 2011-09-17 DIAGNOSIS — G4730 Sleep apnea, unspecified: Secondary | ICD-10-CM | POA: Insufficient documentation

## 2011-09-17 DIAGNOSIS — M549 Dorsalgia, unspecified: Secondary | ICD-10-CM

## 2011-09-17 DIAGNOSIS — I2510 Atherosclerotic heart disease of native coronary artery without angina pectoris: Secondary | ICD-10-CM | POA: Insufficient documentation

## 2011-09-17 DIAGNOSIS — I10 Essential (primary) hypertension: Secondary | ICD-10-CM | POA: Insufficient documentation

## 2011-09-17 DIAGNOSIS — S40012A Contusion of left shoulder, initial encounter: Secondary | ICD-10-CM

## 2011-09-17 DIAGNOSIS — Z8739 Personal history of other diseases of the musculoskeletal system and connective tissue: Secondary | ICD-10-CM | POA: Insufficient documentation

## 2011-09-17 DIAGNOSIS — Z8679 Personal history of other diseases of the circulatory system: Secondary | ICD-10-CM | POA: Insufficient documentation

## 2011-09-17 DIAGNOSIS — Y92009 Unspecified place in unspecified non-institutional (private) residence as the place of occurrence of the external cause: Secondary | ICD-10-CM | POA: Insufficient documentation

## 2011-09-17 DIAGNOSIS — S40019A Contusion of unspecified shoulder, initial encounter: Secondary | ICD-10-CM | POA: Insufficient documentation

## 2011-09-17 DIAGNOSIS — Z79899 Other long term (current) drug therapy: Secondary | ICD-10-CM | POA: Insufficient documentation

## 2011-09-17 MED ORDER — OXYCODONE-ACETAMINOPHEN 5-325 MG PO TABS
1.0000 | ORAL_TABLET | Freq: Once | ORAL | Status: AC
  Administered 2011-09-17: 1 via ORAL
  Filled 2011-09-17: qty 1

## 2011-09-17 MED ORDER — IBUPROFEN 400 MG PO TABS
400.0000 mg | ORAL_TABLET | Freq: Once | ORAL | Status: AC
  Administered 2011-09-17: 400 mg via ORAL
  Filled 2011-09-17: qty 1

## 2011-09-17 NOTE — ED Notes (Signed)
Pt c/o left sided arm pain. Pt currently has left sided paralysis as a result of a stroke in the past. Pt reports when she fell that she fell on her left side and back. Pt denies any LOC or head trauma. Pt responses appropriate, pt alert and oriented at this time.

## 2011-09-17 NOTE — ED Notes (Addendum)
Pt from [facility], pt ws getting up to go the bathroom when pt fell. Pt states she fell on her left side and her back. Pt c/o left side arm pain at this time. Pt denies any LOC or head trauma.

## 2011-09-17 NOTE — ED Notes (Signed)
Patient transported to X-ray 

## 2011-09-17 NOTE — ED Notes (Signed)
MD at bedside. 

## 2011-09-17 NOTE — ED Provider Notes (Signed)
History    This chart was scribed for Deborah Razor, MD, MD by Smitty Pluck. The patient was seen in room APA03 and the patient's care was started at 11:09PM.   CSN: [id number]  Arrival date & time 09/17/11  2246   First MD Initiated Contact with Patient 09/17/11 2306      Chief Complaint  Patient presents with  . Fall    (Consider location/radiation/quality/duration/timing/severity/associated sxs/prior treatment) Patient is a 56 y.o. female presenting with fall. The history is provided by the patient.  Fall   Deborah Maddox is a 56 y.o. female who presents to the Emergency Department complaining of moderate left arm pain and lower back due to fall today. The pain has been constant since onset without radiation. Pt reports falling while getting out of bed by tripping over her wheel chair while doing to the bathroom. Pt is in wheel chair do to complication with stoke. Pt denies LOC, and head injury. She reports tingling in her foot.   Past Medical History  Diagnosis Date  . Stroke   . Seizures   . Coronary artery disease   . Hypertension   . Arthritis   . Sleep apnea   . Cancer     Past Surgical History  Procedure Date  . Leg surgery   . Cervical cone biopsy   . Breast surgery   . Tonsillectomy   . Leg surgery     left    Family History  Problem Relation Age of Onset  . Diabetes Mother   . Diabetes Sister   . Cancer Brother   . Cancer Other   . Diabetes Other     History  Substance Use Topics  . Smoking status: Former Smoker    Quit date: 03/25/1981  . Smokeless tobacco: Never Used  . Alcohol Use: No    OB History    Grav Para Term Preterm Abortions TAB SAB Ect Mult Living   3 2 2  1  1          Review of Systems  All other systems reviewed and are negative.   10 Systems reviewed and are negative for acute change except as noted in the HPI.  Allergies  Latex and Other  Home Medications   Current Outpatient Rx  Name Route Sig Dispense Refill  .  ASPIRIN EC 325 MG PO TBEC Oral Take 325 mg by mouth daily.      Marland Kitchen HYDROCOD POLST-CPM POLST ER 10-8 MG/5ML PO LQCR  5ml po q12h for cough/congestion 120 mL 0  . CHOLECALCIFEROL 400 UNITS PO TABS Oral Take 200 Units by mouth daily.      Marland Kitchen DILTIAZEM HCL ER COATED BEADS 300 MG PO CP24 Oral Take 300 mg by mouth daily.      Marland Kitchen DOCUSATE SODIUM 100 MG PO CAPS Oral Take 100 mg by mouth daily.      . DULOXETINE HCL 60 MG PO CPEP Oral Take 60 mg by mouth daily.      Marland Kitchen FLUTICASONE PROPIONATE 50 MCG/ACT NA SUSP Nasal Place 1 spray into the nose every morning.      Marland Kitchen GABAPENTIN 300 MG PO CAPS Oral Take 300 mg by mouth 3 (three) times daily.      . IBUPROFEN 200 MG PO TABS Oral Take 200 mg by mouth 3 (three) times daily as needed. Pain     . LAMOTRIGINE 25 MG PO TABS Oral Take 25 mg by mouth 2 (two) times daily.      Marland Kitchen  LISINOPRIL 40 MG PO TABS Oral Take 40 mg by mouth daily.      Marland Kitchen LORATADINE 10 MG PO TABS Oral Take 10 mg by mouth daily.      Marland Kitchen LORAZEPAM 1 MG PO TABS Oral Take 1 mg by mouth 3 (three) times daily as needed. Anxiety    . LOVASTATIN 20 MG PO TABS Oral Take 20 mg by mouth at bedtime.      . NYSTATIN 100000 UNIT/GM EX CREA  Apply to affected area 2 times daily 30 g 0  . OMEPRAZOLE 20 MG PO CPDR Oral Take 20 mg by mouth daily.      Marland Kitchen ONDANSETRON HCL 4 MG PO TABS Oral Take 4 mg by mouth every 8 (eight) hours as needed. Nausea/vomiting     . PHENYTOIN SODIUM EXTENDED 100 MG PO CAPS Oral Take 200-300 mg by mouth 2 (two) times daily. *Take 2 capsules in the morning and 3 capsules at bedtime*    . CALCIUM POLYCARBOPHIL 625 MG PO TABS Oral Take 625 mg by mouth daily as needed. constipation    . SERTRALINE HCL 100 MG PO TABS Oral Take 100 mg by mouth every morning.        BP 139/74  Pulse 74  Temp(Src) 98.3 F (36.8 C) (Oral)  Resp 18  Ht 5\' 3"  (1.6 m)  Wt 260 lb (117.935 kg)  BMI 46.06 kg/m2  SpO2 98%  Physical Exam  Nursing note and vitals reviewed. Constitutional: She is oriented to person,  place, and time. She appears well-developed and well-nourished. No distress.       obese  HENT:  Head: Normocephalic and atraumatic.  Eyes: Conjunctivae and EOM are normal. Pupils are equal, round, and reactive to light.  Neck: Normal range of motion. Neck supple. No thyromegaly present.  Cardiovascular: Normal rate, regular rhythm and normal heart sounds.   Pulmonary/Chest: Effort normal and breath sounds normal. No respiratory distress.  Abdominal: Soft. Bowel sounds are normal. She exhibits no distension. There is no tenderness.  Musculoskeletal: She exhibits tenderness (upper lumbar spine).       No crepitus Moderate tenderness anterior left shoulder Pain with ROM of left shoulder No pain with ROM of elbow Contractures of left hand  Brace on left foot 4/5 st on lower left extremities  Mild midline tenderness upper l spine. No crepitus. No overlying skin changes.  Neurological: She is alert and oriented to person, place, and time. No cranial nerve deficit.  Skin: Skin is warm and dry.  Psychiatric: She has a normal mood and affect. Her behavior is normal.    ED Course  Procedures (including critical care time)  DIAGNOSTIC STUDIES: Oxygen Saturation is 98% on room air, normal by my interpretation.    COORDINATION OF CARE:  11:30PM EDP ordered medication: Advil 400 mg    Labs Reviewed - No data to display Dg Lumbar Spine Complete  09/18/2011  *RADIOLOGY REPORT*  Clinical Data: Larey Seat  LUMBAR SPINE - COMPLETE 4+ VIEW  Comparison: 04/12/2011  Findings: Mild narrowing of the L5-S1 interspace.  Small anterior endplate spurs T9-L2. There is no evidence of lumbar spine fracture.  Alignment is normal.  Patchy aortic calcifications noted.  IMPRESSION: 1.  Negative for fracture or other acute abnormality. 2.  Stable mild degenerative changes as above.  Original Report Authenticated By: Osa Craver, M.D.   Dg Shoulder Left  09/18/2011  *RADIOLOGY REPORT*  Clinical Data: Pain post  fall.  LEFT SHOULDER - 2+ VIEW  Comparison: None.  Findings: Diffuse osteopenia.  Negative for fracture, dislocation, or other acute bony abnormality. No significant osseous degenerative change.  IMPRESSION:  1.  Osteopenia without acute abnormality.  Original Report Authenticated By: Thora Lance III, M.D.     1. Back pain   2. Contusion of shoulder, left       MDM   56yF with L shoulder and back pain after fall. No hx to suggest syncope.  Consider fx, contusion, sprain. XR neg for acute osseous injury. Per pt, no acute neuro complaints. Consistent with contusion. Plan PRN tylenol or nsaids for pain. outpt fu as needed.     I personally preformed the services scribed in my presence. The recorded information has been reviewed and considered. Deborah Razor, MD.    Deborah Razor, MD 09/18/11 [phone]

## 2011-09-18 NOTE — ED Notes (Signed)
MD at bedside. 

## 2011-09-18 NOTE — ED Notes (Signed)
Pt returned from xray

## 2011-12-22 ENCOUNTER — Encounter (HOSPITAL_COMMUNITY): Payer: Self-pay

## 2011-12-22 ENCOUNTER — Emergency Department (HOSPITAL_COMMUNITY)
Admission: EM | Admit: 2011-12-22 | Discharge: 2011-12-22 | Disposition: A | Discharge status: Home / Self Care | Payer: Medicare Other | Attending: Emergency Medicine | Admitting: Emergency Medicine

## 2011-12-22 ENCOUNTER — Emergency Department (HOSPITAL_COMMUNITY): Payer: Medicare Other

## 2011-12-22 DIAGNOSIS — M549 Dorsalgia, unspecified: Secondary | ICD-10-CM | POA: Insufficient documentation

## 2011-12-22 DIAGNOSIS — M25559 Pain in unspecified hip: Secondary | ICD-10-CM | POA: Insufficient documentation

## 2011-12-22 DIAGNOSIS — I10 Essential (primary) hypertension: Secondary | ICD-10-CM | POA: Insufficient documentation

## 2011-12-22 DIAGNOSIS — Z8673 Personal history of transient ischemic attack (TIA), and cerebral infarction without residual deficits: Secondary | ICD-10-CM | POA: Insufficient documentation

## 2011-12-22 DIAGNOSIS — R51 Headache: Secondary | ICD-10-CM | POA: Insufficient documentation

## 2011-12-22 DIAGNOSIS — T148XXA Other injury of unspecified body region, initial encounter: Secondary | ICD-10-CM

## 2011-12-22 DIAGNOSIS — W06XXXA Fall from bed, initial encounter: Secondary | ICD-10-CM | POA: Insufficient documentation

## 2011-12-22 DIAGNOSIS — Z8739 Personal history of other diseases of the musculoskeletal system and connective tissue: Secondary | ICD-10-CM | POA: Insufficient documentation

## 2011-12-22 DIAGNOSIS — I2510 Atherosclerotic heart disease of native coronary artery without angina pectoris: Secondary | ICD-10-CM | POA: Insufficient documentation

## 2011-12-22 DIAGNOSIS — M542 Cervicalgia: Secondary | ICD-10-CM | POA: Insufficient documentation

## 2011-12-22 DIAGNOSIS — M25579 Pain in unspecified ankle and joints of unspecified foot: Secondary | ICD-10-CM | POA: Insufficient documentation

## 2011-12-22 DIAGNOSIS — M25569 Pain in unspecified knee: Secondary | ICD-10-CM | POA: Insufficient documentation

## 2011-12-22 DIAGNOSIS — Y92009 Unspecified place in unspecified non-institutional (private) residence as the place of occurrence of the external cause: Secondary | ICD-10-CM | POA: Insufficient documentation

## 2011-12-22 DIAGNOSIS — Z79899 Other long term (current) drug therapy: Secondary | ICD-10-CM | POA: Insufficient documentation

## 2011-12-22 DIAGNOSIS — G4730 Sleep apnea, unspecified: Secondary | ICD-10-CM | POA: Insufficient documentation

## 2011-12-22 DIAGNOSIS — W19XXXA Unspecified fall, initial encounter: Secondary | ICD-10-CM

## 2011-12-22 NOTE — ED Notes (Signed)
Pt reports slipped and fell in floor and hit back on bed.  C/O pain in back and r hip

## 2011-12-22 NOTE — ED Notes (Signed)
RN called back to [facility] pt transportation should be here approximately 1215. Nad noted.

## 2011-12-22 NOTE — Discharge Instructions (Signed)
RESOURCE GUIDE  Dental Problems  Patients with Medicaid: [facility]                     [facility] [address]                                           [address] Phone:  [phone]                                                  Phone:  [phone]  If unable to pay or uninsured, contact:  [facility] or [facility] to become qualified for the adult dental clinic.  Chronic Pain Problems Contact [facility]  [phone] Patients need to be referred by their primary care doctor.  Insufficient Money for Medicine Contact [employer]:  call "211" or [facility] 271-5999.  No Primary Care Doctor Call [facility]  [phone] Other agencies that provide inexpensive medical care    [facility]  [phone]    [facility]  [phone]    [facility]  [phone]    [facility] Clinic  [phone]    [facility]  [phone]    [facility]  [phone]  Psychological Services [facility]  [phone] [facility]  [phone] [facility]   [phone] (emergency services [phone])  Substance Abuse Resources [facility]  336-882-2125 [facility] 336-784-9470 [facility] 336-285-9073 [facility] 336-845-3988 Residential & Outpatient Substance Abuse Program  800-659-3381  Abuse/Neglect [place] Child Abuse Hotline (336) 641-3795 [place] Child Abuse Hotline 800-378-5315 (After Hours)  Emergency Shelter [facility] (336) 271-5985  Maternity Homes [facility] (336) 275-9566 [facility] (704) 372-4663  MRSA Hotline #:   832-7006    [place] [facility]  [facility] [facility]     [employer]                          [facility] [address] [place]                       [address]      [address]  [place]                                                [place]                            [place] Phone:  [phone]                                   Phone:  [phone]                 Phone:  [phone]  [facility] Phone:  [phone]    [facility] Child Abuse Hotline (336)358-1827 (657)349-9631 (After Hours)   Take your usual prescriptions as previously directed.  Call your regular medical doctor today to schedule a follow up appointment within the next 2 to 3 days.  Return to the Emergency Department immediately sooner if worsening.

## 2011-12-22 NOTE — ED Provider Notes (Signed)
History   This chart was scribed for Deborah Anger, DO by [name]. The patient was seen in room APA06/APA06.    CSN: [id number]  Arrival date & time 12/22/11  7829   First MD Initiated Contact with Patient 12/22/11 [phone]      Chief Complaint  Patient presents with  . Fall     HPI Pt seen at 0751. Pt c/o of constant hip and lower back pain from a sudden fall sustained this morning while getting out of bed. Patient states she "slipped in urine," and landed on her buttocks on the floor and hitting her lower back against the bed.  Additionally pt complaining of right foot and left knee pain.  Denies LOC, no seizure, no CP/SOB, no abd pain, no prodromal symptoms before fall, no focal motor weakness, no tingling/numbness in extremities.    Past Medical History  Diagnosis Date  . Seizures   . Coronary artery disease   . Hypertension   . Arthritis   . Sleep apnea   . Cancer     cervical  . Stroke     left sided weakness    Past Surgical History  Procedure Date  . Leg surgery   . Cervical cone biopsy   . Breast surgery   . Tonsillectomy   . Leg surgery     left    Family History  Problem Relation Age of Onset  . Diabetes Mother   . Diabetes Sister   . Cancer Brother   . Cancer Other   . Diabetes Other     History  Substance Use Topics  . Smoking status: Former Smoker    Quit date: 03/25/1981  . Smokeless tobacco: Never Used  . Alcohol Use: No    OB History    Grav Para Term Preterm Abortions TAB SAB Ect Mult Living   3 2 2  1  1          Review of Systems ROS: Statement: All systems negative except as marked or noted in the HPI; Constitutional: Negative for fever and chills. ; ; Eyes: Negative for eye pain, redness and discharge. ; ; ENMT: Negative for ear pain, hoarseness, nasal congestion, sinus pressure and sore throat. ; ; Cardiovascular: Negative for chest pain, palpitations, diaphoresis, dyspnea and peripheral edema. ; ; Respiratory: Negative  for cough, wheezing and stridor. ; ; Gastrointestinal: Negative for nausea, vomiting, diarrhea, abdominal pain, blood in stool, hematemesis, jaundice and rectal bleeding. . ; ; Genitourinary: Negative for dysuria, flank pain and hematuria. ; ; Musculoskeletal: +back pain, neck pain, right foot, left knee. Negative for swelling and deformity.; ; Skin: Negative for pruritus, rash, abrasions, blisters, bruising and skin lesion.; ; Neuro: Negative for headache, lightheadedness and neck stiffness. Negative for weakness, altered level of consciousness , altered mental status, extremity weakness, paresthesias, involuntary movement, seizure and syncope.      Allergies  Latex and Other  Home Medications   Current Outpatient Rx  Name Route Sig Dispense Refill  . ASPIRIN EC 325 MG PO TBEC Oral Take 325 mg by mouth daily.      Marland Kitchen HYDROCOD POLST-CPM POLST ER 10-8 MG/5ML PO LQCR  5ml po q12h for cough/congestion 120 mL 0  . CHOLECALCIFEROL 400 UNITS PO TABS Oral Take 200 Units by mouth daily.      Marland Kitchen DILTIAZEM HCL ER COATED BEADS 300 MG PO CP24 Oral Take 300 mg by mouth daily.      Marland Kitchen DOCUSATE SODIUM 100 MG PO  CAPS Oral Take 100 mg by mouth daily.      . DULOXETINE HCL 60 MG PO CPEP Oral Take 60 mg by mouth daily.      Marland Kitchen FLUTICASONE PROPIONATE 50 MCG/ACT NA SUSP Nasal Place 1 spray into the nose every morning.      Marland Kitchen GABAPENTIN 300 MG PO CAPS Oral Take 300 mg by mouth 3 (three) times daily.      . IBUPROFEN 200 MG PO TABS Oral Take 200 mg by mouth 3 (three) times daily as needed. Pain     . LAMOTRIGINE 25 MG PO TABS Oral Take 25 mg by mouth 2 (two) times daily.      Marland Kitchen LISINOPRIL 40 MG PO TABS Oral Take 40 mg by mouth daily.      Marland Kitchen LORATADINE 10 MG PO TABS Oral Take 10 mg by mouth daily.      Marland Kitchen LORAZEPAM 1 MG PO TABS Oral Take 1 mg by mouth 3 (three) times daily as needed. Anxiety    . LOVASTATIN 20 MG PO TABS Oral Take 20 mg by mouth at bedtime.      . NYSTATIN 100000 UNIT/GM EX CREA  Apply to affected area  2 times daily 30 g 0  . OMEPRAZOLE 20 MG PO CPDR Oral Take 20 mg by mouth daily.      Marland Kitchen ONDANSETRON HCL 4 MG PO TABS Oral Take 4 mg by mouth every 8 (eight) hours as needed. Nausea/vomiting     . PHENYTOIN SODIUM EXTENDED 100 MG PO CAPS Oral Take 200-300 mg by mouth 2 (two) times daily. *Take 2 capsules in the morning and 3 capsules at bedtime*    . CALCIUM POLYCARBOPHIL 625 MG PO TABS Oral Take 625 mg by mouth daily as needed. constipation    . SERTRALINE HCL 100 MG PO TABS Oral Take 100 mg by mouth every morning.        BP 150/90  Pulse 60  Temp(Src) 97.6 F (36.4 C) (Oral)  Resp 20  Ht 5\' 4"  (1.626 m)  Wt 260 lb (117.935 kg)  BMI 44.63 kg/m2  SpO2 99%  Physical Exam 0755: Physical examination:  Nursing notes reviewed; Vital signs and O2 SAT reviewed;  Constitutional: Well developed, Well nourished, Well hydrated, In no acute distress; Head:  Normocephalic, atraumatic; Eyes: EOMI, PERRL, No scleral icterus; ENMT: Mouth and pharynx normal, Mucous membranes moist; Neck: Supple, Full range of motion, No lymphadenopathy; Cardiovascular: Regular rate and rhythm, No murmur, rub, or gallop; Respiratory: Breath sounds clear & equal bilaterally, No rales, rhonchi, wheezes, or rub, Normal respiratory effort/excursion; Chest: Nontender, Movement normal; Abdomen: Soft, Nontender, Nondistended, Normal bowel sounds; Spine:  No midline CS, TS, LS tenderness.  +TTP right thoracic and lumbar paraspinal muscles.; Extremities: Pulses normal. NT right knee, ankle, and foot without edema, erythema, ecchymosis, open wounds, or deformity; right foot with strong pedal pulse, NMS intact, no proximal right fibular tenderness, +plantarflexion of right foot with calf squeeze, no palp gaps in right Achilles tendon area.  +left knee with generalized TTP, no specific area of point tenderness, +FROM left knee, including able to spontaneously flex/extend left hip and lower leg.  No ligamentous laxity.  No patellar or quad  tendon step-offs.  Spontaneously moves left ankle/foot/toes, strong pedal pp.  No left proximal fibular head tenderness.  No left knee edema, erythema, ecchymosis, deformity or open wounds.  NT left ankle/foot.  Pelvis stable. No calf edema or asymmetry.; Neuro: AA&Ox3, Major CN grossly intact.  +LUE and  LLE weakness compared to right per hx CVA.  Pt moves all ext spontaneously on stretcher.; Skin: Color normal, Warm, Dry.    ED Course  Procedures    MDM  MDM Reviewed: previous chart, nursing note and vitals Interpretation: x-ray and CT scan    Dg Lumbar Spine Complete 12/22/2011  *RADIOLOGY REPORT*  Clinical Data: Pain, fall  LUMBAR SPINE - COMPLETE 4+ VIEW  Comparison: 03/26/2011  Findings: Osseous demineralization. Hypoplastic last rib pair. Five non-rib bearing lumbar vertebrae. Facet degenerative changes lower lumbar spine. Disc space narrowing at lower thoracic and upper lumbar spine. Vertebral body heights maintained without fracture or subluxation. No spondylolysis or bone destruction. SI joints symmetric.  IMPRESSION: Osseous demineralization with mild scattered degenerative disc and facet disease changes of the thoracolumbar spine. No acute abnormalities.  Original Report Authenticated By: Lollie Marrow, M.D.   Dg Pelvis 1-2 Views 12/22/2011  *RADIOLOGY REPORT*  Clinical Data: Left hip pain post fall  PELVIS - 1-2 VIEW  Comparison: 04/14/2011  Findings: Osseous demineralization. Mild bilateral narrowing of the hip joints. No acute fracture, dislocation, or bone destruction. Scattered pelvic phleboliths. Degenerative facet disease changes lower lumbar spine.  IMPRESSION: Osseous demineralization. No acute pelvic abnormalities identified.  Original Report Authenticated By: Lollie Marrow, M.D.   Ct Head Wo Contrast 12/22/2011  *RADIOLOGY REPORT*  Clinical Data:  Larey Seat today, hit head.  Head pain.  Neck pain.  CT HEAD WITHOUT CONTRAST CT CERVICAL SPINE WITHOUT CONTRAST  Technique:   Multidetector CT imaging of the head and cervical spine was performed following the standard protocol without intravenous contrast.  Multiplanar CT image reconstructions of the cervical spine were also generated.  Comparison:  CT head 03/09/2011.  CT head and cervical 04/17/2010.  CT HEAD  Findings: There is no evidence for acute infarction, intracranial hemorrhage, mass lesion, hydrocephalus, or extra-axial fluid. Premature atrophy is present.  There is a large remote right MCA territory infarct affecting the frontal, temporal, parietal, and insular regions including the right basal ganglia. Remote right cerebellar infarcts are also noted.  The calvarium is intact. There is no significant scalp hematoma or radiopaque foreign body. No scalp laceration is seen.  Clear sinuses and mastoids. Negative orbits.  Compared with prior examinations, the appearance of the brain is unchanged.  IMPRESSION: Remote infarctions as described.  No acute intracranial hemorrhage or skull fracture.  CT CERVICAL SPINE  Findings: There is no visible cervical spine fracture or traumatic subluxation.  There is no prevertebral soft tissue swelling or intraspinal hematoma.  Mild facet arthropathy is seen bilaterally at C3-4 and C7-T1.  The odontoid is intact.  Normal craniocervical junction and cervicothoracic junction alignment.  No neck masses.  Bilateral carotid calcification.  Clear lung apices without pneumothorax.  Compared with CT cervical spine from 2011, a similar appearance is noted.  IMPRESSION: No visible cervical spine fracture or traumatic subluxation.  Mild spondylosis.  Original Report Authenticated By: Elsie Stain, M.D.   Ct Cervical Spine Wo Contrast 12/22/2011  *RADIOLOGY REPORT*  Clinical Data:  Larey Seat today, hit head.  Head pain.  Neck pain.  CT HEAD WITHOUT CONTRAST CT CERVICAL SPINE WITHOUT CONTRAST  Technique:  Multidetector CT imaging of the head and cervical spine was performed following the standard protocol  without intravenous contrast.  Multiplanar CT image reconstructions of the cervical spine were also generated.  Comparison:  CT head 03/09/2011.  CT head and cervical 04/17/2010.  CT HEAD  Findings: There is no evidence for acute infarction, intracranial hemorrhage,  mass lesion, hydrocephalus, or extra-axial fluid. Premature atrophy is present.  There is a large remote right MCA territory infarct affecting the frontal, temporal, parietal, and insular regions including the right basal ganglia. Remote right cerebellar infarcts are also noted.  The calvarium is intact. There is no significant scalp hematoma or radiopaque foreign body. No scalp laceration is seen.  Clear sinuses and mastoids. Negative orbits.  Compared with prior examinations, the appearance of the brain is unchanged.  IMPRESSION: Remote infarctions as described.  No acute intracranial hemorrhage or skull fracture.  CT CERVICAL SPINE  Findings: There is no visible cervical spine fracture or traumatic subluxation.  There is no prevertebral soft tissue swelling or intraspinal hematoma.  Mild facet arthropathy is seen bilaterally at C3-4 and C7-T1.  The odontoid is intact.  Normal craniocervical junction and cervicothoracic junction alignment.  No neck masses.  Bilateral carotid calcification.  Clear lung apices without pneumothorax.  Compared with CT cervical spine from 2011, a similar appearance is noted.  IMPRESSION: No visible cervical spine fracture or traumatic subluxation.  Mild spondylosis.  Original Report Authenticated By: Elsie Stain, M.D.   Dg Knee Complete 4 Views Left 12/22/2011  *RADIOLOGY REPORT*  Clinical Data: Left knee pain post fall  LEFT KNEE - COMPLETE 4+ VIEW  Comparison: None  Findings: Osseous demineralization. Diffuse joint space narrowing. Minimal spurring at medial compartment. No acute fracture, dislocation or bone destruction. No knee joint effusion.  IMPRESSION: Osseous demineralization with mild degenerative changes  left knee. No acute bony abnormalities.  Original Report Authenticated By: Lollie Marrow, M.D.   Dg Foot Complete Right 12/22/2011  *RADIOLOGY REPORT*  Clinical Data: Right foot pain post fall  RIGHT FOOT COMPLETE - 3+ VIEW  Comparison: None  Findings: Overpenetration on AP view. Osseous demineralization. Joint spaces preserved. Plantar calcaneal spur. Question mild sclerosis at the posterior subtalar joint. No definite fracture, dislocation or bone destruction.  IMPRESSION: Calcaneal spurring. Questionable degenerative changes at the subtalar joints. No acute abnormalities.  Original Report Authenticated By: Lollie Marrow, M.D.       10:24 AM:  CT and XR negative.  No midline CS TTP, FROM CS without TTP, c-collar removed.  Dx testing d/w pt.  Questions answered.  Verb understanding, agreeable to d/c back to NH with outpt f/u.        I personally performed the services described in this documentation, which was scribed in my presence. The recorded information has been reviewed and considered. Sylvi Rybolt Allison Quarry, DO 12/24/11 1006

## 2012-01-09 ENCOUNTER — Emergency Department (HOSPITAL_COMMUNITY): Payer: Medicare Other

## 2012-01-09 ENCOUNTER — Encounter (HOSPITAL_COMMUNITY): Payer: Self-pay | Admitting: *Deleted

## 2012-01-09 ENCOUNTER — Emergency Department (HOSPITAL_COMMUNITY)
Admission: EM | Admit: 2012-01-09 | Discharge: 2012-01-09 | Disposition: A | Discharge status: Home / Self Care | Payer: Medicare Other | Attending: Emergency Medicine | Admitting: Emergency Medicine

## 2012-01-09 DIAGNOSIS — W19XXXA Unspecified fall, initial encounter: Secondary | ICD-10-CM | POA: Insufficient documentation

## 2012-01-09 DIAGNOSIS — T1490XA Injury, unspecified, initial encounter: Secondary | ICD-10-CM | POA: Insufficient documentation

## 2012-01-09 DIAGNOSIS — Z79899 Other long term (current) drug therapy: Secondary | ICD-10-CM | POA: Insufficient documentation

## 2012-01-09 DIAGNOSIS — R42 Dizziness and giddiness: Secondary | ICD-10-CM | POA: Insufficient documentation

## 2012-01-09 DIAGNOSIS — I2510 Atherosclerotic heart disease of native coronary artery without angina pectoris: Secondary | ICD-10-CM | POA: Insufficient documentation

## 2012-01-09 DIAGNOSIS — I69998 Other sequelae following unspecified cerebrovascular disease: Secondary | ICD-10-CM | POA: Insufficient documentation

## 2012-01-09 DIAGNOSIS — R5383 Other fatigue: Secondary | ICD-10-CM | POA: Insufficient documentation

## 2012-01-09 DIAGNOSIS — M47812 Spondylosis without myelopathy or radiculopathy, cervical region: Secondary | ICD-10-CM | POA: Insufficient documentation

## 2012-01-09 DIAGNOSIS — I10 Essential (primary) hypertension: Secondary | ICD-10-CM | POA: Insufficient documentation

## 2012-01-09 DIAGNOSIS — R109 Unspecified abdominal pain: Secondary | ICD-10-CM | POA: Insufficient documentation

## 2012-01-09 DIAGNOSIS — R5381 Other malaise: Secondary | ICD-10-CM | POA: Insufficient documentation

## 2012-01-09 DIAGNOSIS — M25519 Pain in unspecified shoulder: Secondary | ICD-10-CM | POA: Insufficient documentation

## 2012-01-09 MED ORDER — OXYCODONE-ACETAMINOPHEN 5-325 MG PO TABS
2.0000 | ORAL_TABLET | Freq: Once | ORAL | Status: AC
  Administered 2012-01-09: 2 via ORAL
  Filled 2012-01-09: qty 2

## 2012-01-09 NOTE — Discharge Instructions (Signed)
Home Safety and Preventing Falls Falls are a leading cause of injury and while they affect all age groups, falls have greater short-term and long-term impact on older age groups. However, falls should not be a part of life or aging. It is possible for individuals and their families to use preventive measures to significantly decrease the likelihood that anyone, especially an older adult, will fall. There are many simple measures which can make your home safer with respect to preventing falls. The following actions can help reduce falls among all members of your family and are especially important as you age, when your balance, lower limb strength, coordination, and eyesight may be declining. The use of preventive measures will help to reduce you and your family's risk of falls and serious medical consequences. OUTDOORS  Repair cracks and edges of walkways and driveways.   Remove high doorway thresholds and trim shrubbery on the main path into your home.   Ensure there is good outside lighting at main entrances and along main walkways.   Clear walkways of tools, rocks, debris, and clutter.   Check that handrails are not broken and are securely fastened. Both sides of steps should have handrails.   In the garage, be attentive to and clean up grease or oil spills on the cement. This can make the surface extremely slippery.   In winter, have leaves, snow, and ice cleared regularly.   Use sand or salt on walkways during winter months.  BATHROOM  Install grab bars by the toilet and in the tub and shower.   Use non-skid mats or decals in the tub or shower.   If unable to easily stand unsupported while showering, place a plastic non slip stool in the shower to sit on when needed.   Install night lights.   Keep floors dry and clean up all water on the floor immediately.   Remove soap buildup in tub or shower on a regular basis.   Secure bath mats with non-slip, double-sided rug tape.    Remove tripping hazards from the floors.  BEDROOMS  Install night lights.   Do not use oversized bedding.   Make sure a bedside light is easy to reach.   Keep a telephone by your bedside.   Make sure that you can get in and out of your bed easily.   Have a firm chair, with side arms, to use for getting dressed.   Remove clutter from around closets.   Store clothing, bed coverings, and other household items where you can reach them comfortably.   Remove tripping hazards from the floor.  LIVING AREAS AND STAIRWAYS  Turn on lights to avoid having to walk through dark areas.   Keep lighting uniform in each room. Place brighter lightbulbs in darker areas, including stairways.   Replace lightbulbs that burn out in stairways immediately.   Arrange furniture to provide for clear pathways.   Keep furniture in the same place.   Eliminate or tape down electrical cables in high traffic areas.   Place handrails on both sides of stairways. Use handrails when going up or down stairs.   Most falls occur on the top or bottom 3 steps.   Fix any loose handrails. Make sure handrails on both sides of the stairways are as long as the stairs.   Remove all walkway obstacles.   Coil or tape electrical cords off to the side of walking areas and out of the way. If using many extension cords, have an electrician   put in a new wall outlet to reduce or eliminate them.   Make sure spills are cleaned up quickly and allow time for drying before walking on freshly cleaned floors.   Firmly attach carpet with non-skid or two-sided tape.   Keep frequently used items within easy reach.   Remove tripping hazards such as throw rugs and clutter in walkways. Never leave objects on stairs.   Get rid of throw rugs elsewhere if possible.   Eliminate uneven floor surfaces.   Make sure couches and chairs are easy to get into and out of.   Check carpeting to make sure it is firmly attached along stairs.    Make repairs to worn or loose carpet promptly.   Select a carpet pattern that does not visually hide the edge of steps.   Avoid placing throw rugs or scatter rugs at the top or bottom of stairways, or properly secure with carpet tape to prevent slippage.   Have an electrician put in a light switch at the top and bottom of the stairs.   Get light switches that glow.   Avoid the following practices: hurrying, inattention, obscured vision, carrying large loads, and wearing slip-on shoes.   Be aware of all pets.  KITCHEN  Place items that are used frequently, such as dishes and food, within easy reach.   Keep handles on pots and pans toward the center of the stove. Use back burners when possible.   Make sure spills are cleaned up quickly and allow time for drying.   Avoid walking on wet floors.   Avoid hot utensils and knives.   Position shelves so they are not too high or low.   Place commonly used objects within easy reach.   If necessary, use a sturdy step stool with a grab bar when reaching.   Make sure electrical cables are out of the way.   Do not use floor polish or wax that makes floors slippery.  OTHER HOME FALL PREVENTION STRATEGIES  Wear low heel or rubber sole shoes that are supportive and fit well.   Wear closed toe shoes.   Know and watch for side effects of medications. Have your caregiver or pharmacist look at all your medicines, even over-the-counter medicines. Some medicines can make you sleepy or dizzy.   Exercise regularly. Exercise makes you stronger and improves your balance and coordination.   Limit use of alcohol.   Use eyeglasses if necessary and keep them clean. Have your vision checked every year.   Organize your household in a manner that minimizes the need to walk distances when hurried, or go up and down stairs unnecessarily. For example, have a phone placed on at least each floor of your home. If possible, have a phone beside each sitting  or lying area where you spend the most time at home. Keep emergency numbers posted at all phones.   Use non-skid floor wax.   When using a ladder, make sure:   The base is firm.   All ladder feet are on level ground.   The ladder is angled against the wall properly.   When climbing a ladder, face the ladder and hold the ladder rungs firmly.   If reaching, always keep your hips and body weight centered between the rails.   When using a stepladder, make sure it is fully opened and both spreaders are firmly locked.   Do not climb a closed stepladder.   Avoid climbing beyond the second step from the top   of a stepladder and the 4th rung from the top of an extension ladder.   Learn and use mobility aids as needed.   Change positions slowly. Arise slowly from sitting and lying positions. Sit on the edge of your bed before getting to your feet.   If you have a history of falls, ask someone to add color or contrast paint or tape to grab bars and handrails in your home.   If you have a history of falls, ask someone to place contrasting color strips on first and last steps.   Install an electrical emergency response system if you need one, and know how to use it.   If you have a medical or other condition that causes you to have limited physical strength, it is important that you reach out to family and friends for occasional help.  FOR CHILDREN:  If young children are in the home, use safety gates. At the top of stairs use screw-mounted gates; use pressure-mounted gates for the bottom of the stairs and doorways between rooms.   Young children should be taught to descend stairs on their stomachs, feet first, and later using the handrail.   Keep drawers fully closed to prevent them from being climbed on or pulled out entirely.   Move chairs, cribs, beds and other furniture away from windows.   Consider installing window guards on windows ground floor and up, unless they are emergency  fire exits. Make sure they have easy release mechanisms.   Consider installing special locks that only allow the window to be opened to a certain height.   Never rely on window screens to prevent falls.   Never leave babies alone on changing tables, beds or sofas. Use a changing table that has a restraining strap.   When a child can pull to a standing position, the crib mattress should be adjusted to its lowest position. There should be at least 26 inches between the top rails of the crib drop side and the mattress. Toys, bumper pads, and other objects that can be used as steps to climb out should be removed from the crib.   On bunk beds never allow a child under age 6 to sleep on the top bunk. For older children, if the upper bunk is not against a wall, use guard rails on both sides. No matter how old a child is, keep the guard rails in place on the top bunk since children roll during sleep. Do not permit horseplay on bunks.   Grass and soil surfaces beneath backyard playground equipment should be replaced with hardwood chips, shredded wood mulch, sand, pea gravel, rubber, crushed stone, or another safer material at depths of at least 9 to 12 inches.   When riding bikes or using skates, skateboards, skis, or snowboards, require children to wear helmets. Look for those that have stickers stating that they meet or exceed safety standards.   Vertical posts or pickets in deck, balcony, and stairway railings should be no more than 3 1/2 inches apart if a young baby will have access to the area. The space between horizontal rails or bars, and between the floor and the first horizontal rail or bar, should be no more than 3 1/2 inches.  Document Released: 07/17/2002 Document Revised: 07/16/2011 Document Reviewed: 05/16/2009 [employer] Patient Information 2012 [employer]. 

## 2012-01-09 NOTE — ED Notes (Signed)
Pt was coming out of the bathroom when she stumbled and fell face down on the floor.  pt c/o pain to left shoulder, left foot, left hand. Pt arrived to er via ems with towel roll in place for c-collar, pt on LSB. Pt denies any loc,

## 2012-01-09 NOTE — ED Provider Notes (Signed)
History  This chart was scribed for Dayton Bailiff, MD by Stevphen Meuse. This patient was seen in room APA15/[facility]15 and the patient's care was started at 10:11AM.   CSN: [id number]  Arrival date & time 01/09/12  1007   First MD Initiated Contact with Patient 01/09/12 1009      Chief Complaint  Patient presents with  . Fall    (Consider location/radiation/quality/duration/timing/severity/associated sxs/prior treatment) Patient is a 56 y.o. female presenting with fall. The history is provided by the patient. No language interpreter was used.  Fall The accident occurred 1 to 2 hours ago. The fall occurred while walking. She fell from an unknown height. She landed on a hard floor. There was no blood loss. The point of impact was the left shoulder and left wrist. The pain is present in the left shoulder and left wrist (left foot). She was not ambulatory at the scene. There was no entrapment after the fall. Associated symptoms include abdominal pain. Pertinent negatives include no fever, no numbness, no bowel incontinence, no nausea, no vomiting, no headaches and no loss of consciousness. The symptoms are aggravated by pressure on the injury. Treatment on scene includes a backboard and a c-collar (Towel roll in place for c- collar). She has tried nothing for the symptoms.   Deborah Maddox is a 56 y.o. female brought in by ambulance, who presents to the Emergency Department complaining of  sudden onset, gradually worsening left shoulder, left foot and left hand pain associated to a fall. Pt states that she was coming out of the bathroom when she stumbled and fell on the floor. Pt arrived to the ED via EMS on a LSB with towel roll in place of c-collar. Pt denies LOC. Pt denies taking any medication to relieve her current symptoms. Pt was recently seen in the ED for a fall. Pt reports dizziness and abdominal pain as associated symptoms. Pt denies fever, sore throat, eye pain, SOB, chest pain, dysuria,  arthralgias, HA, adenopathy and confusion as associated symptoms. Pt has a h/o seizures, coronary artery disease, arthritis, HTN and stroke with associated left sided weakness. Pt is a former smoker and denies a h/o alcohol use.   Past Medical History  Diagnosis Date  . Seizures   . Coronary artery disease   . Hypertension   . Arthritis   . Sleep apnea   . Cancer     cervical  . Stroke     left sided weakness    Past Surgical History  Procedure Date  . Leg surgery   . Cervical cone biopsy   . Breast surgery   . Tonsillectomy   . Leg surgery     left    Family History  Problem Relation Age of Onset  . Diabetes Mother   . Diabetes Sister   . Cancer Brother   . Cancer Other   . Diabetes Other     History  Substance Use Topics  . Smoking status: Former Smoker    Quit date: 03/25/1981  . Smokeless tobacco: Never Used  . Alcohol Use: No    OB History    Grav Para Term Preterm Abortions TAB SAB Ect Mult Living   3 2 2  1  1          Review of Systems  Constitutional: Negative for fever, chills and fatigue.  HENT: Negative for congestion, sore throat, neck pain and neck stiffness.   Eyes: Negative for pain.  Respiratory: Negative for shortness of breath.  Cardiovascular: Negative for chest pain.  Gastrointestinal: Positive for abdominal pain. Negative for nausea, vomiting, diarrhea, constipation and bowel incontinence.  Genitourinary: Negative for dysuria, urgency, frequency and flank pain.  Musculoskeletal: Negative for myalgias, back pain and arthralgias.  Skin: Negative for rash.  Neurological: Positive for dizziness. Negative for loss of consciousness, weakness, light-headedness, numbness and headaches.  Hematological: Negative for adenopathy.  Psychiatric/Behavioral: Negative for confusion.  All other systems reviewed and are negative.    Allergies  Latex and Other  Home Medications   Current Outpatient Rx  Name Route Sig Dispense Refill  .  ASPIRIN EC 325 MG PO TBEC Oral Take 325 mg by mouth daily.      Marland Kitchen BUTALBITAL-APAP-CAFFEINE 50-325-40 MG PO TABS Oral Take 1 tablet by mouth every 6 (six) hours as needed. Headache    . VITAMIN D 2000 UNITS PO CAPS Oral Take 1 capsule by mouth daily.    Marland Kitchen VITAMIN B-12 IJ Injection Inject as directed every 30 (thirty) days.    Marland Kitchen DILTIAZEM HCL ER COATED BEADS 300 MG PO CP24 Oral Take 300 mg by mouth daily.      Marland Kitchen DOCUSATE SODIUM 100 MG PO CAPS Oral Take 100 mg by mouth daily.      . DULOXETINE HCL 60 MG PO CPEP Oral Take 60 mg by mouth daily.      Marland Kitchen FLUTICASONE PROPIONATE 50 MCG/ACT NA SUSP Nasal Place 1 spray into the nose every morning.      Marland Kitchen GABAPENTIN 300 MG PO CAPS Oral Take 300 mg by mouth 3 (three) times daily.      . IBUPROFEN 200 MG PO TABS Oral Take 200 mg by mouth 3 (three) times daily as needed. Pain and fever    . LAMOTRIGINE 25 MG PO TABS Oral Take 25 mg by mouth 2 (two) times daily.      Marland Kitchen LISINOPRIL 40 MG PO TABS Oral Take 40 mg by mouth daily.      Marland Kitchen LORATADINE 10 MG PO TABS Oral Take 10 mg by mouth daily.      Marland Kitchen LORAZEPAM 1 MG PO TABS Oral Take 1 mg by mouth 3 (three) times daily as needed. For Anxiety    . LOVASTATIN 20 MG PO TABS Oral Take 20 mg by mouth at bedtime.      . NYSTATIN 100000 UNIT/GM EX CREA Topical Apply 1 application topically 2 (two) times daily.    Marland Kitchen OMEPRAZOLE 20 MG PO CPDR Oral Take 20 mg by mouth daily.      Marland Kitchen PHENYTOIN SODIUM EXTENDED 100 MG PO CAPS Oral Take 200-300 mg by mouth 2 (two) times daily. *Take 2 capsules in the morning and 3 capsules at bedtime*    . CALCIUM POLYCARBOPHIL 625 MG PO TABS Oral Take 625 mg by mouth daily as needed. constipation    . PROCHLORPERAZINE MALEATE 10 MG PO TABS Oral Take 10 mg by mouth every 6 (six) hours as needed. For nausea    . SERTRALINE HCL 100 MG PO TABS Oral Take 100 mg by mouth every morning.        Triage Vitals: BP 133/83  Pulse 83  Temp(Src) 98.5 F (36.9 C) (Oral)  Resp 20  SpO2 98%  Physical Exam    Nursing note and vitals reviewed. Constitutional: She is oriented to person, place, and time. No distress.       Pt is morbidly obese   HENT:  Head: Normocephalic and atraumatic.  Eyes: Conjunctivae and EOM are normal.  Pupils are equal, round, and reactive to light.  Neck: Normal range of motion. Neck supple.  Cardiovascular: Normal rate, regular rhythm, normal heart sounds and intact distal pulses.        Strong distal pulse to the left upper extremities   Pulmonary/Chest: Effort normal and breath sounds normal.  Abdominal: Bowel sounds are normal. There is no tenderness (no tenderness when distracted).  Musculoskeletal: Normal range of motion.       Pt has no pain to lower left extremities. Pt has no back or neck pain. She has left shoulder pain to palpation. Pt also has normal left range of motion secondary to pain.  Neurological: She is alert and oriented to person, place, and time.  Skin: Skin is warm and dry.    ED Course  Procedures (including critical care time) DIAGNOSTIC STUDIES: Oxygen Saturation is 98% on room air, normal by my interpretation.    COORDINATION OF CARE:  10:15AM discussed ordering X-rays to check for abnormalities with pt and pt agreed  Labs Reviewed - No data to display Dg Chest 1 View  01/09/2012  *RADIOLOGY REPORT*  Clinical Data: Fall  CHEST - 1 VIEW  Comparison: Chest radiograph 12/22/2011  Findings: Stable enlarged heart silhouette.  No effusion, infiltrate, or pneumothorax.  Central venous congestion is present.  IMPRESSION: Cardiomegaly and central venous congestion.  Original Report Authenticated By: Genevive Bi, M.D.   Ct Head Wo Contrast  01/09/2012  *RADIOLOGY REPORT*  Clinical Data:  Status post fall  CT HEAD WITHOUT CONTRAST CT CERVICAL SPINE WITHOUT CONTRAST  Technique:  Multidetector CT imaging of the head and cervical spine was performed following the standard protocol without intravenous contrast.  Multiplanar CT image reconstructions of  the cervical spine were also generated.  Comparison:   12/22/2011  CT HEAD  Findings: There is a large area of encephalomalacia involving the right cerebral hemisphere.  Associated ex vacuo dilatation of the lateral horn of the right lateral ventricle is present. Old infarct within the right cerebellar hemisphere is identified consistent with previous stroke.  There is diffuse patchy low density throughout the subcortical and periventricular white matter consistent with chronic small vessel ischemic change.  There is prominence of the sulci and ventricles consistent with brain atrophy.  There is no evidence for acute brain infarct, hemorrhage or mass.  The paranasal sinuses and the mastoid air cells are clear.  The skull appears intact.  Next field  IMPRESSION: 1.  No acute intracranial abnormalities. 2. Chronic changes as above.  CT CERVICAL SPINE  Findings: Normal alignment of the cervical spine.  The vertebral body heights are well preserved.  There is mild multilevel disc space narrowing and ventral spurring.  No fracture or subluxation.  IMPRESSION:  1.  No acute findings.  2.  Cervical spondylosis noted.  Original Report Authenticated By: Rosealee Albee, M.D.   Ct Cervical Spine Wo Contrast  01/09/2012  *RADIOLOGY REPORT*  Clinical Data:  Status post fall  CT HEAD WITHOUT CONTRAST CT CERVICAL SPINE WITHOUT CONTRAST  Technique:  Multidetector CT imaging of the head and cervical spine was performed following the standard protocol without intravenous contrast.  Multiplanar CT image reconstructions of the cervical spine were also generated.  Comparison:   12/22/2011  CT HEAD  Findings: There is a large area of encephalomalacia involving the right cerebral hemisphere.  Associated ex vacuo dilatation of the lateral horn of the right lateral ventricle is present. Old infarct within the right cerebellar hemisphere is identified consistent with  previous stroke.  There is diffuse patchy low density throughout the  subcortical and periventricular white matter consistent with chronic small vessel ischemic change.  There is prominence of the sulci and ventricles consistent with brain atrophy.  There is no evidence for acute brain infarct, hemorrhage or mass.  The paranasal sinuses and the mastoid air cells are clear.  The skull appears intact.  Next field  IMPRESSION: 1.  No acute intracranial abnormalities. 2. Chronic changes as above.  CT CERVICAL SPINE  Findings: Normal alignment of the cervical spine.  The vertebral body heights are well preserved.  There is mild multilevel disc space narrowing and ventral spurring.  No fracture or subluxation.  IMPRESSION:  1.  No acute findings.  2.  Cervical spondylosis noted.  Original Report Authenticated By: Rosealee Albee, M.D.   Dg Shoulder Left  01/09/2012  *RADIOLOGY REPORT*  Clinical Data: Fall, left shoulder pain.  LEFT SHOULDER - 2+ VIEW  Comparison: 09/17/2011  Findings: No acute bony abnormality.  Specifically, no fracture, subluxation, or dislocation.  Soft tissues are intact.  Early degenerative changes in the left [facility] joint.  IMPRESSION: No acute bony abnormality.  Original Report Authenticated By: Cyndie Chime, M.D.     1. Fall       MDM  Imaging is negative. Patient to be discharged back to her facility. Fall precautions were provided. Instructed to follow up with her primary care physician as needed. Ice the affected areas. No neurologic symptoms. No concern about a malignant injury.     I personally performed the services described in this documentation, which was scribed in my presence. The recorded information has been reviewed and considered.    Dayton Bailiff, MD 01/09/12 1145

## 2012-01-09 NOTE — ED Notes (Signed)
Pt eating lunch tray, [facility] staff here to pick pt up

## 2012-01-09 NOTE — ED Notes (Signed)
Report given to [employer], advised that it would be "a little bit" before they are able to come pick pt up, diet tray ordered for pt while she waits

## 2012-01-20 ENCOUNTER — Ambulatory Visit: Payer: Medicare Other | Admitting: Cardiology

## 2012-02-01 ENCOUNTER — Encounter: Payer: Self-pay | Admitting: Cardiology

## 2012-02-01 ENCOUNTER — Ambulatory Visit (INDEPENDENT_AMBULATORY_CARE_PROVIDER_SITE_OTHER): Payer: Medicare Other | Admitting: Cardiology

## 2012-02-01 VITALS — BP 124/72 | HR 80 | Resp 18 | Ht 64.0 in | Wt 282.0 lb

## 2012-02-01 DIAGNOSIS — R002 Palpitations: Secondary | ICD-10-CM

## 2012-02-01 DIAGNOSIS — R079 Chest pain, unspecified: Secondary | ICD-10-CM

## 2012-02-01 DIAGNOSIS — I10 Essential (primary) hypertension: Secondary | ICD-10-CM

## 2012-02-01 NOTE — Patient Instructions (Addendum)
**Note De-identified Deborah Maddox Obfuscation** Your physician has requested that you have an echocardiogram. Echocardiography is a painless test that uses sound waves to create images of your heart. It provides your doctor with information about the size and shape of your heart and how well your heart's chambers and valves are working. This procedure takes approximately one hour. There are no restrictions for this procedure.  Your physician recommends that you return for lab work in: today  Your physician recommends that you schedule a follow-up appointment in: as needed   

## 2012-02-01 NOTE — Assessment & Plan Note (Signed)
Good control. No change in medications. 

## 2012-02-01 NOTE — Progress Notes (Signed)
HPI Deborah Maddox is referred today by [facility] family practice for palpitations and chest pain.  She is a resident of time [facility] assisted living. 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   She describes her chest discomfort as midsternal and localized. She can cut her index finger on it. It does not radiate. It is not associated with any other symptoms.  Her palpitations are intermittent and she's had these for a long time. They're not sustained. They do not occur with the chest pain. She has not had presyncope or syncope.  He thinks that her heart through a clot that caused her stroke. However, she is not on Coumadin. She does take a full strength aspirin. She does not remember a diagnosis of A. fib or flutter. She does not recall having a valve issue. Past Medical History  Diagnosis Date  . Seizures   . Coronary artery disease   . Hypertension   . Arthritis   . Sleep apnea   . Cancer     cervical  . Stroke     left sided weakness    Current Outpatient Prescriptions  Medication Sig Dispense Refill  . aspirin EC 325 MG tablet Take 325 mg by mouth daily.        . butalbital-acetaminophen-caffeine (FIORICET, ESGIC) 50-325-40 MG per tablet Take 1 tablet by mouth every 6 (six) hours as needed. Headache      . Cholecalciferol (VITAMIN D) 2000 UNITS CAPS Take 1 capsule by mouth daily.      . Cyanocobalamin (VITAMIN B-12 IJ) Inject as directed every 30 (thirty) days.      Marland Kitchen diltiazem (CARDIZEM CD) 300 MG 24 hr capsule Take 300 mg by mouth daily.        Marland Kitchen docusate sodium (COLACE) 100 MG capsule Take 100 mg by mouth daily.        . DULoxetine (CYMBALTA) 60 MG capsule Take 60 mg by mouth daily.        . fluticasone (FLONASE) 50 MCG/ACT nasal spray Place 1 spray into the nose every morning.        . gabapentin (NEURONTIN) 300 MG capsule Take 300 mg by mouth 3 (three) times daily.        Marland Kitchen ibuprofen (ADVIL,MOTRIN) 200 MG tablet Take 200 mg by  mouth 3 (three) times daily as needed. Pain and fever      . lamoTRIgine (LAMICTAL) 25 MG tablet Take 25 mg by mouth 2 (two) times daily.        Marland Kitchen lisinopril (PRINIVIL,ZESTRIL) 40 MG tablet Take 40 mg by mouth daily.        Marland Kitchen loratadine (CLARITIN) 10 MG tablet Take 10 mg by mouth daily.        Marland Kitchen LORazepam (ATIVAN) 1 MG tablet Take 1 mg by mouth 3 (three) times daily as needed. For Anxiety      . lovastatin (MEVACOR) 20 MG tablet Take 20 mg by mouth at bedtime.        Marland Kitchen nystatin cream (MYCOSTATIN) Apply 1 application topically 2 (two) times daily.      Marland Kitchen omeprazole (PRILOSEC) 20 MG capsule Take 20 mg by mouth daily.        . phenytoin (DILANTIN) 100 MG ER capsule Take 200-300 mg by mouth 2 (two) times daily. *Take 2 capsules in the morning and 3 capsules at bedtime*      . polycarbophil (FIBER-LAX) 625 MG tablet Take 625 mg  by mouth daily as needed. constipation      . prochlorperazine (COMPAZINE) 10 MG tablet Take 10 mg by mouth every 6 (six) hours as needed. For nausea      . sertraline (ZOLOFT) 100 MG tablet Take 100 mg by mouth every morning.          Allergies  Allergen Reactions  . Latex Rash  . Other Rash    HEART  MONITOR TAPE: severe rash, bleeding    Family History  Problem Relation Age of Onset  . Diabetes Mother   . Diabetes Sister   . Cancer Brother   . Cancer Other   . Diabetes Other     History   Social History  . Marital Status: Single    Spouse Name: N/A    Number of Children: N/A  . Years of Education: N/A   Occupational History  . Not on file.   Social History Main Topics  . Smoking status: Former Smoker    Quit date: 03/25/1981  . Smokeless tobacco: Never Used  . Alcohol Use: No  . Drug Use: No  . Sexually Active: No   Other Topics Concern  . Not on file   Social History Narrative  . No narrative on file    ROS ALL NEGATIVE EXCEPT THOSE NOTED IN HPI  PE  General Appearance: well developed, well nourished in no acute distress, obese, in a  wheelchair HEENT: symmetrical face, PERRLA, good dentition  Neck: no JVD, thyromegaly, or adenopathy, trachea midline Chest: symmetric without deformity Cardiac: PMI non-displaced, RRR, normal S1, S2, no gallop or murmur Lung: clear to ausculation and percussion Vascular: all pulses full without bruits  Abdominal: nondistended, nontender, good bowel sounds, no HSM, no bruits Extremities: no cyanosis, clubbing , atrophy with slight edema of the left leg., no sign of DVT, no varicosities  Skin: normal color, no rashes Neuro: alert and oriented x 3, left hemiplegia Pysch: normal affect  EKG Normal sinus rhythm, first-degree block, LVH. No change from EKG in 2012. BMET    Component Value Date/Time   NA 137 04/12/2011 1857   K 4.1 04/12/2011 1857   CL 101 04/12/2011 1857   CO2 27 04/12/2011 1857   GLUCOSE 105* 04/12/2011 1857   BUN 15 04/12/2011 1857   CREATININE <0.47* 04/12/2011 1857   CALCIUM 9.4 04/12/2011 1857   GFRNONAA NOT CALCULATED 04/12/2011 1857   GFRAA NOT CALCULATED 04/12/2011 1857    Lipid Panel  No results found for this basename: chol, trig, hdl, cholhdl, vldl, ldlcalc    CBC    Component Value Date/Time   WBC 9.9 04/12/2011 1857   RBC 4.08 04/12/2011 1857   HGB 13.4 04/12/2011 1857   HCT 40.3 04/12/2011 1857   PLT 327 04/12/2011 1857   MCV 98.8 04/12/2011 1857   MCH 32.8 04/12/2011 1857   MCHC 33.3 04/12/2011 1857   RDW 13.3 04/12/2011 1857   LYMPHSABS 3.8 04/12/2011 1857   MONOABS 1.2* 04/12/2011 1857   EOSABS 0.3 04/12/2011 1857   BASOSABS 0.1 04/12/2011 1857

## 2012-02-01 NOTE — Assessment & Plan Note (Signed)
I suspect these are most likely benign. We'll check echocardiogram to assess LV function. If she has significant hypertrophy this could just be PVCs. We'll check TSH, potassium and magnesium. We'll not add any medications at this time.

## 2012-02-01 NOTE — Assessment & Plan Note (Signed)
Noncardiac by history. Probably tension or stress.

## 2012-02-02 LAB — BASIC METABOLIC PANEL
CO2: 29 mEq/L (ref 19–32)
Glucose, Bld: 89 mg/dL (ref 70–99)
Potassium: 4.4 mEq/L (ref 3.5–5.3)
Sodium: 141 mEq/L (ref 135–145)

## 2012-02-02 LAB — TSH: TSH: 1.284 u[IU]/mL (ref 0.350–4.500)

## 2012-02-03 ENCOUNTER — Ambulatory Visit (HOSPITAL_COMMUNITY)
Admission: RE | Admit: 2012-02-03 | Discharge: 2012-02-03 | Disposition: A | Discharge status: Home / Self Care | Payer: Medicare Other | Source: Ambulatory Visit | Attending: Cardiology | Admitting: Cardiology

## 2012-02-03 ENCOUNTER — Other Ambulatory Visit (HOSPITAL_COMMUNITY): Payer: Medicare Other

## 2012-02-03 DIAGNOSIS — R079 Chest pain, unspecified: Secondary | ICD-10-CM | POA: Insufficient documentation

## 2012-02-03 DIAGNOSIS — Z8673 Personal history of transient ischemic attack (TIA), and cerebral infarction without residual deficits: Secondary | ICD-10-CM | POA: Insufficient documentation

## 2012-02-03 DIAGNOSIS — I517 Cardiomegaly: Secondary | ICD-10-CM

## 2012-02-03 DIAGNOSIS — I10 Essential (primary) hypertension: Secondary | ICD-10-CM | POA: Insufficient documentation

## 2012-02-03 DIAGNOSIS — R002 Palpitations: Secondary | ICD-10-CM | POA: Insufficient documentation

## 2012-02-03 NOTE — Progress Notes (Signed)
*  PRELIMINARY RESULTS* Echocardiogram 2D Echocardiogram has been performed.  Deborah Maddox 02/03/2012, 9:41 AM

## 2012-02-11 ENCOUNTER — Emergency Department (HOSPITAL_COMMUNITY): Payer: Medicare Other

## 2012-02-11 ENCOUNTER — Encounter (HOSPITAL_COMMUNITY): Payer: Self-pay

## 2012-02-11 ENCOUNTER — Emergency Department (HOSPITAL_COMMUNITY)
Admission: EM | Admit: 2012-02-11 | Discharge: 2012-02-12 | Disposition: A | Discharge status: Home / Self Care | Payer: Medicare Other | Attending: Emergency Medicine | Admitting: Emergency Medicine

## 2012-02-11 DIAGNOSIS — Z87891 Personal history of nicotine dependence: Secondary | ICD-10-CM | POA: Insufficient documentation

## 2012-02-11 DIAGNOSIS — M129 Arthropathy, unspecified: Secondary | ICD-10-CM | POA: Insufficient documentation

## 2012-02-11 DIAGNOSIS — S20229A Contusion of unspecified back wall of thorax, initial encounter: Secondary | ICD-10-CM

## 2012-02-11 DIAGNOSIS — W19XXXA Unspecified fall, initial encounter: Secondary | ICD-10-CM

## 2012-02-11 DIAGNOSIS — Z8541 Personal history of malignant neoplasm of cervix uteri: Secondary | ICD-10-CM | POA: Insufficient documentation

## 2012-02-11 DIAGNOSIS — Z8673 Personal history of transient ischemic attack (TIA), and cerebral infarction without residual deficits: Secondary | ICD-10-CM | POA: Insufficient documentation

## 2012-02-11 DIAGNOSIS — G4730 Sleep apnea, unspecified: Secondary | ICD-10-CM | POA: Insufficient documentation

## 2012-02-11 DIAGNOSIS — M549 Dorsalgia, unspecified: Secondary | ICD-10-CM | POA: Insufficient documentation

## 2012-02-11 DIAGNOSIS — I2510 Atherosclerotic heart disease of native coronary artery without angina pectoris: Secondary | ICD-10-CM | POA: Insufficient documentation

## 2012-02-11 DIAGNOSIS — I10 Essential (primary) hypertension: Secondary | ICD-10-CM | POA: Insufficient documentation

## 2012-02-11 MED ORDER — HYDROCODONE-ACETAMINOPHEN 5-325 MG PO TABS
1.0000 | ORAL_TABLET | Freq: Once | ORAL | Status: AC
  Administered 2012-02-11: 1 via ORAL
  Filled 2012-02-11: qty 1

## 2012-02-11 NOTE — ED Provider Notes (Signed)
History     CSN: [id number]  Arrival date & time 02/11/12  2238   First MD Initiated Contact with Patient 02/11/12 2259      Chief Complaint  Patient presents with  . Fall    (Consider location/radiation/quality/duration/timing/severity/associated sxs/prior treatment) HPI  Deborah Maddox is a 56 y.o. female with a history of coronary artery disease, hypertension, stroke with left-sided marked weakness, seizures, brought in by ambulance, who presents to the Emergency Department complaining of a fall while transferring from her wheelchair to the bed. She reports that she did not on an off and so as she sat on the edge of the bed she slid down the edge the bed onto her back onto the floor. She denies hitting her head or neck. There was no loss of consciousness. She has an area on the left adjacent to the thoracic spine  tender to the touch. Denies numbness, tingling, additional weakness over her baseline.   PCP Dr. Felecia Shelling  Past Medical History  Diagnosis Date  . Seizures   . Coronary artery disease   . Hypertension   . Arthritis   . Sleep apnea   . Cancer     cervical  . Stroke     left sided weakness    Past Surgical History  Procedure Date  . Leg surgery   . Cervical cone biopsy   . Breast surgery   . Tonsillectomy   . Leg surgery     left    Family History  Problem Relation Age of Onset  . Diabetes Mother   . Diabetes Sister   . Cancer Brother   . Cancer Other   . Diabetes Other     History  Substance Use Topics  . Smoking status: Former Smoker    Quit date: 03/25/1981  . Smokeless tobacco: Never Used  . Alcohol Use: No    OB History    Grav Para Term Preterm Abortions TAB SAB Ect Mult Living   3 2 2  1  1          Review of Systems  Constitutional: Negative for fever.       10 Systems reviewed and are negative for acute change except as noted in the HPI.  HENT: Negative for congestion.   Eyes: Negative for discharge and redness.  Respiratory: Negative  for cough and shortness of breath.   Cardiovascular: Negative for chest pain.  Gastrointestinal: Negative for vomiting and abdominal pain.  Musculoskeletal: Positive for back pain.  Skin: Negative for rash.  Neurological: Negative for syncope, numbness and headaches.  Psychiatric/Behavioral:       No behavior change.    Allergies  Latex and Other  Home Medications   Current Outpatient Rx  Name Route Sig Dispense Refill  . ASPIRIN EC 325 MG PO TBEC Oral Take 325 mg by mouth daily.      Marland Kitchen BUTALBITAL-APAP-CAFFEINE 50-325-40 MG PO TABS Oral Take 1 tablet by mouth every 6 (six) hours as needed. Headache    . VITAMIN D 2000 UNITS PO CAPS Oral Take 1 capsule by mouth daily.    Marland Kitchen VITAMIN B-12 IJ Injection Inject as directed every 30 (thirty) days.    Marland Kitchen DILTIAZEM HCL ER COATED BEADS 300 MG PO CP24 Oral Take 300 mg by mouth daily.      Marland Kitchen DOCUSATE SODIUM 100 MG PO CAPS Oral Take 100 mg by mouth daily.      . DULOXETINE HCL 60 MG PO CPEP Oral Take 60 mg  by mouth daily.      Marland Kitchen FLUTICASONE PROPIONATE 50 MCG/ACT NA SUSP Nasal Place 1 spray into the nose every morning.      Marland Kitchen GABAPENTIN 300 MG PO CAPS Oral Take 300 mg by mouth 3 (three) times daily.      . IBUPROFEN 200 MG PO TABS Oral Take 200 mg by mouth 3 (three) times daily as needed. Pain and fever    . LAMOTRIGINE 25 MG PO TABS Oral Take 25 mg by mouth 2 (two) times daily.      Marland Kitchen LISINOPRIL 40 MG PO TABS Oral Take 40 mg by mouth daily.      Marland Kitchen LORATADINE 10 MG PO TABS Oral Take 10 mg by mouth daily.      Marland Kitchen LORAZEPAM 1 MG PO TABS Oral Take 1 mg by mouth 3 (three) times daily as needed. For Anxiety    . LOVASTATIN 20 MG PO TABS Oral Take 20 mg by mouth at bedtime.      . NYSTATIN 100000 UNIT/GM EX CREA Topical Apply 1 application topically 2 (two) times daily.    Marland Kitchen OMEPRAZOLE 20 MG PO CPDR Oral Take 20 mg by mouth daily.      Marland Kitchen PHENYTOIN SODIUM EXTENDED 100 MG PO CAPS Oral Take 200-300 mg by mouth 2 (two) times daily. *Take 2 capsules in the  morning and 3 capsules at bedtime*    . CALCIUM POLYCARBOPHIL 625 MG PO TABS Oral Take 625 mg by mouth daily as needed. constipation    . PROCHLORPERAZINE MALEATE 10 MG PO TABS Oral Take 10 mg by mouth every 6 (six) hours as needed. For nausea    . SERTRALINE HCL 100 MG PO TABS Oral Take 100 mg by mouth every morning.        BP 137/74  Pulse 80  Temp 97.5 F (36.4 C)  Resp 18  Ht 5\' 4"  (1.626 m)  Wt 262 lb (118.842 kg)  BMI 44.97 kg/m2  SpO2 99%  Physical Exam  Nursing note and vitals reviewed. Constitutional: She appears well-developed and well-nourished. No distress.       Awake, alert, nontoxic appearance.  HENT:  Head: Atraumatic.  Eyes: Conjunctivae and EOM are normal. Pupils are equal, round, and reactive to light. Right eye exhibits no discharge. Left eye exhibits no discharge.  Neck: Neck supple.  Pulmonary/Chest: Effort normal. She exhibits no tenderness.  Abdominal: Soft. There is no tenderness. There is no rebound.  Musculoskeletal: She exhibits no tenderness.       Baseline ROM, no obvious new focal weakness.No tenderness to percussion of spine. Mild left sided paraspinal muscle tenderness to palpation.  Neurological:       Mental status and motor strength appears baseline for patient and situation.Left sided hemiplegia.  Skin: No rash noted.  Psychiatric: She has a normal mood and affect.    ED Course  Procedures (including critical care time)  Dg Thoracic Spine W/swimmers  02/12/2012  *RADIOLOGY REPORT*  Clinical Data: Back pain status post fall.  THORACIC SPINE - 2 VIEW + SWIMMERS  Comparison: 07/14/2011 chest radiograph, 04/12/2011 abdominal CT.  Findings: Multilevel degenerative changes and anterior osteophyte formation.  There is mild superior endplate deformity at T12.  This is similar to priors. Otherwise vertebral body height and alignment is maintained. Overlying soft tissues within normal limits.  Aortic atherosclerosis.  IMPRESSION: Multilevel degenerative  changes.  Original Report Authenticated By: Waneta Martins, M.D.   Dg Lumbar Spine Complete  02/12/2012  *RADIOLOGY REPORT*  Clinical Data: Back pain status post fall.  LUMBAR SPINE - COMPLETE 4+ VIEW  Comparison: 12/22/2011, 04/12/2011 CT  Findings: Multilevel degenerative changes.  This includes anterior osteophyte formation and facet arthropathy.  Degenerative disc disease at multiple levels.  There is minimal superior endplate deformity at T12 however this appears similar to prior. The sacrum is inadequately evaluated.  IMPRESSION: Multilevel degenerative changes.  No acute osseous finding on the lumbar spine.  The sacrum is inadequately evaluated and dedicated views of the sacrum should be obtained if that is the area of clinical concern.  Original Report Authenticated By: Waneta Martins, M.D.     MDM  Assisted living patient who fell transferring from wheelchair to bed hitting her back on the way down. Xrays negative for acute process. Given analgesic with some relief. Pt stable in ED with no significant deterioration in condition.The patient appears reasonably screened and/or stabilized for discharge and I doubt any other medical condition or other [facility] requiring further screening, evaluation, or treatment in the ED at this time prior to discharge.  MDM Reviewed: nursing note and vitals Interpretation: x-ray           Nicoletta Dress. Colon Branch, MD 02/12/12 0981

## 2012-02-11 NOTE — ED Notes (Signed)
Pt from [facility] facility and fell while transferring.

## 2012-06-04 ENCOUNTER — Emergency Department (HOSPITAL_COMMUNITY): Payer: Medicare Other

## 2012-06-04 ENCOUNTER — Encounter (HOSPITAL_COMMUNITY): Payer: Self-pay | Admitting: Emergency Medicine

## 2012-06-04 ENCOUNTER — Emergency Department (HOSPITAL_COMMUNITY)
Admission: EM | Admit: 2012-06-04 | Discharge: 2012-06-04 | Disposition: A | Discharge status: Home / Self Care | Payer: Medicare Other | Attending: Emergency Medicine | Admitting: Emergency Medicine

## 2012-06-04 DIAGNOSIS — Y929 Unspecified place or not applicable: Secondary | ICD-10-CM | POA: Insufficient documentation

## 2012-06-04 DIAGNOSIS — Z859 Personal history of malignant neoplasm, unspecified: Secondary | ICD-10-CM | POA: Insufficient documentation

## 2012-06-04 DIAGNOSIS — G4730 Sleep apnea, unspecified: Secondary | ICD-10-CM | POA: Insufficient documentation

## 2012-06-04 DIAGNOSIS — T424X4A Poisoning by benzodiazepines, undetermined, initial encounter: Secondary | ICD-10-CM | POA: Insufficient documentation

## 2012-06-04 DIAGNOSIS — T424X1A Poisoning by benzodiazepines, accidental (unintentional), initial encounter: Secondary | ICD-10-CM

## 2012-06-04 DIAGNOSIS — Z87891 Personal history of nicotine dependence: Secondary | ICD-10-CM | POA: Insufficient documentation

## 2012-06-04 DIAGNOSIS — I10 Essential (primary) hypertension: Secondary | ICD-10-CM | POA: Insufficient documentation

## 2012-06-04 DIAGNOSIS — Z79899 Other long term (current) drug therapy: Secondary | ICD-10-CM | POA: Insufficient documentation

## 2012-06-04 DIAGNOSIS — R569 Unspecified convulsions: Secondary | ICD-10-CM | POA: Insufficient documentation

## 2012-06-04 DIAGNOSIS — Z7982 Long term (current) use of aspirin: Secondary | ICD-10-CM | POA: Insufficient documentation

## 2012-06-04 DIAGNOSIS — Z8673 Personal history of transient ischemic attack (TIA), and cerebral infarction without residual deficits: Secondary | ICD-10-CM | POA: Insufficient documentation

## 2012-06-04 DIAGNOSIS — M129 Arthropathy, unspecified: Secondary | ICD-10-CM | POA: Insufficient documentation

## 2012-06-04 DIAGNOSIS — R4182 Altered mental status, unspecified: Secondary | ICD-10-CM | POA: Insufficient documentation

## 2012-06-04 DIAGNOSIS — I2510 Atherosclerotic heart disease of native coronary artery without angina pectoris: Secondary | ICD-10-CM | POA: Insufficient documentation

## 2012-06-04 DIAGNOSIS — Y939 Activity, unspecified: Secondary | ICD-10-CM | POA: Insufficient documentation

## 2012-06-04 DIAGNOSIS — T43591A Poisoning by other antipsychotics and neuroleptics, accidental (unintentional), initial encounter: Secondary | ICD-10-CM | POA: Insufficient documentation

## 2012-06-04 LAB — TROPONIN I: Troponin I: <0.3 ng/mL (ref <0.30)

## 2012-06-04 LAB — CBC WITH DIFFERENTIAL/PLATELET
Basophils Absolute: 0 10*3/uL (ref 0.0–0.1)
Basophils Relative: 1 % (ref 0–1)
Eosinophils Relative: 1 % (ref 0–5)
HCT: 40.8 % (ref 36.0–46.0)
Hemoglobin: 13.8 g/dL (ref 12.0–15.0)
Lymphocytes Relative: 38 % (ref 12–46)
MCHC: 33.8 g/dL (ref 30.0–36.0)
MCV: 97.6 fL (ref 78.0–100.0)
Monocytes Absolute: 1.1 10*3/uL — ABNORMAL HIGH (ref 0.1–1.0)
Monocytes Relative: 12 % (ref 3–12)
RDW: 13.2 % (ref 11.5–15.5)

## 2012-06-04 LAB — COMPREHENSIVE METABOLIC PANEL
AST: 14 U/L (ref 0–37)
BUN: 11 mg/dL (ref 6–23)
CO2: 25 mEq/L (ref 19–32)
Calcium: 8.8 mg/dL (ref 8.4–10.5)
Chloride: 100 mEq/L (ref 96–112)
Creatinine, Ser: 0.42 mg/dL — ABNORMAL LOW (ref 0.50–1.10)
GFR calc non Af Amer: >90 mL/min (ref >90)
Total Bilirubin: 0.2 mg/dL — ABNORMAL LOW (ref 0.3–1.2)

## 2012-06-04 LAB — PHENYTOIN LEVEL, TOTAL: Phenytoin Lvl: 20.3 ug/mL — ABNORMAL HIGH (ref 10.0–20.0)

## 2012-06-04 MED ORDER — FLUMAZENIL 0.5 MG/5ML IV SOLN
0.2000 mg | Freq: Once | INTRAVENOUS | Status: AC
Start: 2012-06-04 — End: 2012-06-04
  Administered 2012-06-04: 0.2 mg via INTRAVENOUS

## 2012-06-04 MED ORDER — SODIUM CHLORIDE 0.9 % IV BOLUS (SEPSIS)
500.0000 mL | Freq: Once | INTRAVENOUS | Status: AC
  Administered 2012-06-04: 500 mL via INTRAVENOUS

## 2012-06-04 MED ORDER — FLUMAZENIL 0.5 MG/5ML IV SOLN
INTRAVENOUS | Status: AC
  Filled 2012-06-04: qty 5

## 2012-06-04 NOTE — ED Notes (Signed)
[facility] arrived with a car for transportation and family was not pleased, stating she would be better in an ambulance.

## 2012-06-04 NOTE — ED Notes (Signed)
Report called to nursing home. Waiting form EMS to transport back to facility.

## 2012-06-04 NOTE — ED Provider Notes (Signed)
History   This chart was scribed for Geoffery Lyons, MD scribed by Magnus Sinning. The patient was seen in room APA04/APA04 at 7:12    CSN: [id number]  Arrival date & time 06/04/12  4098     Chief Complaint  Patient presents with  . Altered Mental Status    (Consider location/radiation/quality/duration/timing/severity/associated sxs/prior treatment) The history is provided by the EMS personnel. No language interpreter was used.   Deborah Maddox is a 56 y.o. female BIB EMS who presents to the Emergency Department for EVAL of a mental status change. Per nurse, EMS says the pt was started on 1 mg tid of Ativan four days ago and soon after started having AMS. Past Medical History  Diagnosis Date  . Seizures   . Coronary artery disease   . Hypertension   . Arthritis   . Sleep apnea   . Cancer     cervical  . Stroke     left sided weakness    Past Surgical History  Procedure Date  . Leg surgery   . Cervical cone biopsy   . Breast surgery   . Tonsillectomy   . Leg surgery     left    Family History  Problem Relation Age of Onset  . Diabetes Mother   . Diabetes Sister   . Cancer Brother   . Cancer Other   . Diabetes Other     History  Substance Use Topics  . Smoking status: Former Smoker    Quit date: 03/25/1981  . Smokeless tobacco: Never Used  . Alcohol Use: No    OB History    Grav Para Term Preterm Abortions TAB SAB Ect Mult Living   3 2 2  1  1          Review of Systems  Unable to perform ROS: Mental status change    Allergies  Latex and Other  Home Medications   Current Outpatient Rx  Name Route Sig Dispense Refill  . ASPIRIN EC 325 MG PO TBEC Oral Take 325 mg by mouth daily.      Marland Kitchen BUTALBITAL-APAP-CAFFEINE 50-325-40 MG PO TABS Oral Take 1 tablet by mouth every 6 (six) hours as needed. Headache    . VITAMIN D 2000 UNITS PO CAPS Oral Take 1 capsule by mouth daily.    Marland Kitchen VITAMIN B-12 IJ Injection Inject as directed every 30 (thirty) days.    Marland Kitchen  DILTIAZEM HCL ER COATED BEADS 300 MG PO CP24 Oral Take 300 mg by mouth daily.      Marland Kitchen DOCUSATE SODIUM 100 MG PO CAPS Oral Take 100 mg by mouth daily.      . DULOXETINE HCL 60 MG PO CPEP Oral Take 60 mg by mouth daily.      Marland Kitchen FLUTICASONE PROPIONATE 50 MCG/ACT NA SUSP Nasal Place 1 spray into the nose every morning.      Marland Kitchen GABAPENTIN 300 MG PO CAPS Oral Take 300 mg by mouth 3 (three) times daily.      . IBUPROFEN 200 MG PO TABS Oral Take 200 mg by mouth 3 (three) times daily as needed. Pain and fever    . LAMOTRIGINE 25 MG PO TABS Oral Take 25 mg by mouth 2 (two) times daily.      Marland Kitchen LISINOPRIL 40 MG PO TABS Oral Take 40 mg by mouth daily.      Marland Kitchen LORATADINE 10 MG PO TABS Oral Take 10 mg by mouth daily.      Marland Kitchen LORAZEPAM 1  MG PO TABS Oral Take 1 mg by mouth 3 (three) times daily as needed. For Anxiety    . LOVASTATIN 20 MG PO TABS Oral Take 20 mg by mouth at bedtime.      . NYSTATIN 100000 UNIT/GM EX CREA Topical Apply 1 application topically 2 (two) times daily.    Marland Kitchen OMEPRAZOLE 20 MG PO CPDR Oral Take 20 mg by mouth daily.      Marland Kitchen PHENYTOIN SODIUM EXTENDED 100 MG PO CAPS Oral Take 200-300 mg by mouth 2 (two) times daily. *Take 2 capsules in the morning and 3 capsules at bedtime*    . CALCIUM POLYCARBOPHIL 625 MG PO TABS Oral Take 625 mg by mouth daily as needed. constipation    . PROCHLORPERAZINE MALEATE 10 MG PO TABS Oral Take 10 mg by mouth every 6 (six) hours as needed. For nausea    . SERTRALINE HCL 100 MG PO TABS Oral Take 100 mg by mouth every morning.        BP 115/69  Pulse 74  Resp 16  Wt 260 lb (117.935 kg)  SpO2 100%  Physical Exam  Constitutional: She appears well-developed and well-nourished. She is sleeping. She is easily aroused. No distress.  Eyes: Conjunctivae normal are normal.  Neck: Neck supple.  Cardiovascular: Normal rate, regular rhythm and normal heart sounds.   No murmur heard. Pulmonary/Chest: Effort normal and breath sounds normal. She has no wheezes. She has no  rales.  Abdominal:       Abd obese benign  Musculoskeletal: She exhibits no edema.  Neurological: She is easily aroused.       Pt is somnolent, but will arouse briefly and nod back off to sleep. Pupils are at pinpoint.  Skin: Skin is warm and dry. She is not diaphoretic.    ED Course  Procedures (including critical care time) DIAGNOSTIC STUDIES: Oxygen Saturation is 100% on room air, normal by my interpretation.    COORDINATION OF CARE: 7:13: Physical exam performed. 9:14: Performed rechecked. Following the Romazicon, the patient is now awake, alert and talking. Patient relatives provides additional history, stating that the SNF facility believed the pt was having difficulty sleeping, unaware that she regularly naps throughout the day, thus administering the Ativan.   Labs Reviewed  CBC WITH DIFFERENTIAL - Abnormal; Notable for the following:    Monocytes Absolute 1.1 (*)     All other components within normal limits  COMPREHENSIVE METABOLIC PANEL - Abnormal; Notable for the following:    Glucose, Bld 127 (*)     Creatinine, Ser 0.42 (*)     Albumin 3.4 (*)     Alkaline Phosphatase 127 (*)     Total Bilirubin 0.2 (*)     All other components within normal limits  PHENYTOIN LEVEL, TOTAL - Abnormal; Notable for the following:    Phenytoin Lvl 20.3 (*)     All other components within normal limits  TROPONIN I   Dg Chest 1 View  06/04/2012  *RADIOLOGY REPORT*  Clinical Data: Altered mental status  CHEST - 1 VIEW  Comparison: 01/09/2012  Findings: Mild cardiomegaly stable.  Relatively low lung volumes with some crowding of bronchovascular structures.  Suspect  mild central pulmonary vascular congestion as before.  No effusion.  IMPRESSION:  Stable mild cardiomegaly and central pulmonary vascular congestion.   Original Report Authenticated By: Osa Craver, M.D.    Ct Head Wo Contrast  06/04/2012  *RADIOLOGY REPORT*  Clinical Data: Altered mental status.  CT HEAD  WITHOUT  CONTRAST  Technique:  Contiguous axial images were obtained from the base of the skull through the vertex without contrast.  Comparison: CT head without contrast 01/09/2012.  Findings: A remote right MCA territory infarct is again noted. Remote infarcts of the cerebellum are stable.  Wallerian degeneration is evident in the right cerebral peduncle.  No acute cortical infarct, hemorrhage, or mass lesion is present. No significant extra-axial fluid collection is present.  There is no significant interval change.  The paranasal sinuses and mastoid air cells are clear.  The osseous skull is intact.  IMPRESSION:  1.  No acute intracranial abnormality or significant interval change. 2.  Stable right MCA territory infarct and right cerebellar infarcts.   Original Report Authenticated By: Jamesetta Orleans. MATTERN, M.D.      No diagnosis found.   Date: 06/04/2012  Rate: 60  Rhythm: normal sinus rhythm  QRS Axis: left  Intervals: PR prolonged  ST/T Wave abnormalities: normal  Conduction Disutrbances:first-degree A-V block   Narrative Interpretation:   Old EKG Reviewed: none available    MDM  The patient presents here with altered mental status.  She has a history of cva and is in an extended care facility.  She was started on ativan 1 mg tid three days ago about the time her symptoms began.  The labs and head ct all fail to show an acute process.  She was given a dose of flumazenil which caused her to wake up.  I suspect that the somnolence is related to the recent addition of ativan to her medications.  I will recommend this be stopped and she is to follow up prn.    I personally performed the services described in this documentation, which was scribed in my presence. The recorded information has been reviewed and considered.           Geoffery Lyons, MD 06/05/12 [phone]

## 2012-06-04 NOTE — ED Notes (Signed)
Pt given romazicon and began to wake up and speak to family. EMD at bedside talking with pt and family

## 2012-06-04 NOTE — ED Notes (Signed)
[facility] called and asked to cancel EMS, they will be picking pt up and transporting her back. Family aware.

## 2012-06-04 NOTE — ED Notes (Signed)
Per EMS: Pt comes from [facility] nursing home.  According to the nursing home, pt was started on Ativan 4 days ago.  Pt was able to communicate with EMS upon their arrival but appeared groggy.  EMS reports her pupils to be sluggish.

## 2012-08-10 DIAGNOSIS — I2699 Other pulmonary embolism without acute cor pulmonale: Secondary | ICD-10-CM

## 2012-08-10 HISTORY — DX: Other pulmonary embolism without acute cor pulmonale: I26.99

## 2012-08-11 ENCOUNTER — Encounter (HOSPITAL_COMMUNITY): Payer: Self-pay | Admitting: *Deleted

## 2012-08-11 ENCOUNTER — Emergency Department (HOSPITAL_COMMUNITY)
Admission: EM | Admit: 2012-08-11 | Discharge: 2012-08-11 | Disposition: A | Discharge status: Home / Self Care | Payer: Medicare Other | Attending: Emergency Medicine | Admitting: Emergency Medicine

## 2012-08-11 ENCOUNTER — Emergency Department (HOSPITAL_COMMUNITY): Payer: Medicare Other

## 2012-08-11 DIAGNOSIS — I2510 Atherosclerotic heart disease of native coronary artery without angina pectoris: Secondary | ICD-10-CM | POA: Insufficient documentation

## 2012-08-11 DIAGNOSIS — Z8669 Personal history of other diseases of the nervous system and sense organs: Secondary | ICD-10-CM | POA: Insufficient documentation

## 2012-08-11 DIAGNOSIS — Z8673 Personal history of transient ischemic attack (TIA), and cerebral infarction without residual deficits: Secondary | ICD-10-CM | POA: Insufficient documentation

## 2012-08-11 DIAGNOSIS — Z7982 Long term (current) use of aspirin: Secondary | ICD-10-CM | POA: Insufficient documentation

## 2012-08-11 DIAGNOSIS — Z87891 Personal history of nicotine dependence: Secondary | ICD-10-CM | POA: Insufficient documentation

## 2012-08-11 DIAGNOSIS — I10 Essential (primary) hypertension: Secondary | ICD-10-CM | POA: Insufficient documentation

## 2012-08-11 DIAGNOSIS — W010XXA Fall on same level from slipping, tripping and stumbling without subsequent striking against object, initial encounter: Secondary | ICD-10-CM | POA: Insufficient documentation

## 2012-08-11 DIAGNOSIS — Z8589 Personal history of malignant neoplasm of other organs and systems: Secondary | ICD-10-CM | POA: Insufficient documentation

## 2012-08-11 DIAGNOSIS — Z79899 Other long term (current) drug therapy: Secondary | ICD-10-CM | POA: Insufficient documentation

## 2012-08-11 DIAGNOSIS — Y9289 Other specified places as the place of occurrence of the external cause: Secondary | ICD-10-CM | POA: Insufficient documentation

## 2012-08-11 DIAGNOSIS — Y9301 Activity, walking, marching and hiking: Secondary | ICD-10-CM | POA: Insufficient documentation

## 2012-08-11 DIAGNOSIS — Z8739 Personal history of other diseases of the musculoskeletal system and connective tissue: Secondary | ICD-10-CM | POA: Insufficient documentation

## 2012-08-11 DIAGNOSIS — S8000XA Contusion of unspecified knee, initial encounter: Secondary | ICD-10-CM | POA: Insufficient documentation

## 2012-08-11 DIAGNOSIS — M25569 Pain in unspecified knee: Secondary | ICD-10-CM

## 2012-08-11 MED ORDER — OXYCODONE-ACETAMINOPHEN 5-325 MG PO TABS
2.0000 | ORAL_TABLET | Freq: Once | ORAL | Status: AC
  Administered 2012-08-11: 2 via ORAL
  Filled 2012-08-11: qty 2

## 2012-08-11 MED ORDER — IBUPROFEN 400 MG PO TABS
600.0000 mg | ORAL_TABLET | Freq: Once | ORAL | Status: AC
Start: 2012-08-11 — End: 2012-08-11
  Administered 2012-08-11: 600 mg via ORAL
  Filled 2012-08-11: qty 2

## 2012-08-11 NOTE — ED Notes (Signed)
Arrives via EMS s/p fall - per EMS, pt was "across the street" having blood drawn - pt tripped, started to fall and was lowered to the ground by staff. Placed on LSB for transport purposes only according to EMS.

## 2012-08-11 NOTE — ED Notes (Signed)
Patient with no complaints at this time. Respirations even and unlabored. Skin warm/dry. Discharge instructions reviewed with patient at this time. Patient given opportunity to voice concerns/ask questions. Patient discharged at this time and left Emergency Department via wheelchair. 

## 2012-08-11 NOTE — ED Notes (Signed)
Patient log rolled off LSB using spinal precautions. No step off noted, patient reports tenderness to lower back and buttocks. Patient alert/oriented x 4. Denies neck/upper back pain.

## 2012-08-11 NOTE — ED Provider Notes (Signed)
History    This chart was scribed for Raeford Razor, MD, MD by Smitty Pluck, ED Scribe. The patient was seen in room APA11/APA11 and the patient's care was started at 12:51 PM.   CSN: [id number]  Arrival date & time 08/11/12  1032      Chief Complaint  Patient presents with  . Fall    (Consider location/radiation/quality/duration/timing/severity/associated sxs/prior treatment) HPI Deborah Maddox is a 57 y.o. female who presents to the Emergency Department BIB EMS complaining of fall onset today. Pt reports that she was having blood drawn at doctor's office and tripped due to her leg giving out. She reports the she has moderate right leg pain, right knee pain and right shoulder pain. She denies LOC, head injury and any other pain.    Past Medical History  Diagnosis Date  . Seizures   . Coronary artery disease   . Hypertension   . Arthritis   . Sleep apnea   . Cancer     cervical  . Stroke     left sided weakness    Past Surgical History  Procedure Date  . Leg surgery   . Cervical cone biopsy   . Breast surgery   . Tonsillectomy   . Leg surgery     left    Family History  Problem Relation Age of Onset  . Diabetes Mother   . Diabetes Sister   . Cancer Brother   . Cancer Other   . Diabetes Other     History  Substance Use Topics  . Smoking status: Former Smoker -- 5 years    Quit date: 03/25/1981  . Smokeless tobacco: Never Used  . Alcohol Use: No    OB History    Grav Para Term Preterm Abortions TAB SAB Ect Mult Living   3 2 2  1  1          Review of Systems  Allergies  Latex and Other  Home Medications   Current Outpatient Rx  Name  Route  Sig  Dispense  Refill  . AMOXICILLIN-POT CLAVULANATE 875-125 MG PO TABS   Oral   Take 1 tablet by mouth 2 (two) times daily. Started 07/28/2012 until 08/11/2012         . ASPIRIN EC 325 MG PO TBEC   Oral   Take 325 mg by mouth daily.           Marland Kitchen VITAMIN D 2000 UNITS PO CAPS   Oral   Take 1 capsule by  mouth daily.         Marland Kitchen DILTIAZEM HCL ER COATED BEADS 300 MG PO CP24   Oral   Take 300 mg by mouth daily.           Marland Kitchen DOCUSATE SODIUM 100 MG PO CAPS   Oral   Take 100 mg by mouth daily.           . DULOXETINE HCL 60 MG PO CPEP   Oral   Take 60 mg by mouth daily.           Marland Kitchen FLUTICASONE PROPIONATE 50 MCG/ACT NA SUSP   Nasal   Place 1 spray into the nose every morning.           Marland Kitchen GABAPENTIN 300 MG PO CAPS   Oral   Take 300-600 mg by mouth 4 (four) times daily. Takes 300mg  three times a day and 600mg  at bedtime.         Marland Kitchen HYDROCODONE-ACETAMINOPHEN 5-325 MG  PO TABS   Oral   Take 1 tablet by mouth every 4 (four) hours as needed. pain         . ACIDOPHILUS PO TABS   Oral   Take 1 tablet by mouth daily.         Marland Kitchen LAMOTRIGINE 25 MG PO TABS   Oral   Take 25 mg by mouth 2 (two) times daily.           Marland Kitchen LISINOPRIL 40 MG PO TABS   Oral   Take 40 mg by mouth daily.           Marland Kitchen LORATADINE 10 MG PO TABS   Oral   Take 10 mg by mouth daily.           Marland Kitchen LOVASTATIN 20 MG PO TABS   Oral   Take 20 mg by mouth at bedtime.           . OMEPRAZOLE 20 MG PO CPDR   Oral   Take 20 mg by mouth daily.           Marland Kitchen PHENYTOIN SODIUM EXTENDED 100 MG PO CAPS   Oral   Take 200-300 mg by mouth 2 (two) times daily. *Take 2 capsules in the morning and 3 capsules at bedtime*         . SERTRALINE HCL 100 MG PO TABS   Oral   Take 100 mg by mouth every morning.           Marland Kitchen VITAMIN B-12 IJ   Injection   Inject as directed every 30 (thirty) days.           BP 131/84  Pulse 97  Temp 98.3 F (36.8 C) (Oral)  Resp 20  SpO2 94%  Physical Exam  Nursing note and vitals reviewed. Constitutional: She appears well-developed and well-nourished. No distress.  HENT:  Head: Normocephalic and atraumatic.  Eyes: Conjunctivae normal are normal. Right eye exhibits no discharge. Left eye exhibits no discharge.  Neck: Neck supple.  Cardiovascular: Normal rate, regular  rhythm and normal heart sounds.  Exam reveals no gallop and no friction rub.   No murmur heard. Pulmonary/Chest: Effort normal and breath sounds normal. No respiratory distress.  Abdominal: Soft. She exhibits no distension. There is no tenderness.  Musculoskeletal: She exhibits tenderness (right anterior knee). She exhibits no edema.       No effusion No pain with ROM of knee neurovascularly intact distally No pain with ROM of hip Right upper back pain No midline spinal tenderness   Neurological: She is alert.  Skin: Skin is warm and dry.  Psychiatric: She has a normal mood and affect. Her behavior is normal. Thought content normal.    ED Course  Procedures (including critical care time) DIAGNOSTIC STUDIES: Oxygen Saturation is 94% on room air, adequate by my interpretation.    COORDINATION OF CARE: 12:54 PM Discussed ED treatment with pt    Labs Reviewed - No data to display Dg Knee Complete 4 Views Right  08/11/2012  *RADIOLOGY REPORT*  Clinical Data: Right knee pain following a fall today.  RIGHT KNEE - COMPLETE 4+ VIEW  Comparison: 03/24/2008.  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  Joint space narrowing and spur formation involving all three joint compartments without significant change.  No fracture, dislocation or effusion seen.  IMPRESSION:  1.  No fracture or effusion. 2.  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 3.  Stable tricompartmental degenerative changes.   Original Report  Authenticated By: Beckie Salts, M.D.      1. Knee pain   2. Contusion, knee       MDM  57 year old female with a right knee pain after mechanical fall. Imaging negative for acute osseous injury. No ligamentous laxity appreciated. Plan symptomatic treatment. Emergent return cautions discussed. Outpatient followup as needed otherwise.  I personally preformed the services scribed in my presence. The recorded information has been reviewed is accurate. Raeford Razor,  MD.      Raeford Razor, MD 08/11/12 [phone]

## 2012-09-27 ENCOUNTER — Encounter: Payer: Self-pay | Admitting: Gastroenterology

## 2012-09-27 ENCOUNTER — Encounter (HOSPITAL_COMMUNITY): Payer: Self-pay | Admitting: Pharmacy Technician

## 2012-09-27 ENCOUNTER — Telehealth: Payer: Self-pay | Admitting: Gastroenterology

## 2012-09-27 ENCOUNTER — Ambulatory Visit (INDEPENDENT_AMBULATORY_CARE_PROVIDER_SITE_OTHER): Payer: Medicare Other | Admitting: Gastroenterology

## 2012-09-27 VITALS — BP 125/70 | HR 96 | Temp 98.5°F | Ht 63.0 in | Wt 281.0 lb

## 2012-09-27 DIAGNOSIS — K5900 Constipation, unspecified: Secondary | ICD-10-CM

## 2012-09-27 DIAGNOSIS — K625 Hemorrhage of anus and rectum: Secondary | ICD-10-CM

## 2012-09-27 MED ORDER — LUBIPROSTONE 24 MCG PO CAPS: 24.0000 ug | ORAL_CAPSULE | Freq: Two times a day (BID) | ORAL | Status: DC

## 2012-09-27 MED ORDER — PEG 3350-KCL-NA BICARB-NACL 420 G PO SOLR: 4000.0000 mL | ORAL | Status: DC

## 2012-09-27 MED ORDER — HYDROCORTISONE ACETATE 25 MG RE SUPP: 25.0000 mg | Freq: Two times a day (BID) | RECTAL | Status: DC

## 2012-09-27 NOTE — Telephone Encounter (Signed)
The rx's that were not covered were the trylyte- they said it was on backorder and they are requesting to just used the generic for this. Gave them the ok to use generic.  The proctozone supp are not covered at all but anusol cream is- ok to change per AS use bid x 7days with 1 rf.  The Valinda Hoar is not covered either- informed Verda Cumins that we would try to do a PA for this medication. Pt has [facility]. Working on paperwork.

## 2012-09-27 NOTE — Patient Instructions (Addendum)
Start taking Amitiza twice a day with food. This is for constipation. You do not need to take any laxatives with this.  We have scheduled a colonoscopy with Dr. Jena Gauss in the near future. Further recommendations to follow once this is completed.   I have also sent in a prescription for suppositories to your pharmacy. This will help ease some of the discomfort in your rectum. Take them twice a day for 7 days.

## 2012-09-27 NOTE — Progress Notes (Signed)
Primary Care Physician:  FANTA,TESFAYE, MD Primary Gastroenterologist:  Dr. Rourk   Chief Complaint  Patient presents with  . Constipation  . Hemorrhoids    HPI:   Deborah Maddox is a 57-year-old female who presents today at the request of Deborah Gusler, NP, secondary to need for colonoscopy. She reports a history of constipation and scant hematochezia recently in the presence of straining. Has noted intermittent hematochezia in the past. +rectal discomfort. Sometimes knife-like, feels like it is tearing. Last colonoscopy 8 or 9 years ago in [place]. Pt denies polyps. States she was awake during the procedure.  Notes BM usually about once per week. Discomfort related to constipation, lower abdomen. Occasional nausea, no vomiting. Hates to throw up. Intermittent reflux but no dysphagia. On Prilosec daily. No wt loss or lack of appetite.   Resides at [facility].    Past Medical History  Diagnosis Date  . Seizures   . Coronary artery disease   . Hypertension   . Arthritis   . Sleep apnea   . Cancer     cervical  . Stroke     at age 34, left sided weakness    Past Surgical History  Procedure Laterality Date  . Leg surgery    . Cervical cone biopsy    . Breast surgery    . Tonsillectomy    . Leg surgery      left    Current Outpatient Prescriptions  Medication Sig Dispense Refill  . aspirin EC 325 MG tablet Take 325 mg by mouth daily.        . Cholecalciferol (VITAMIN D) 2000 UNITS CAPS Take 1 capsule by mouth daily.      . Cyanocobalamin (VITAMIN B-12 IJ) Inject as directed every 30 (thirty) days.      . diltiazem (CARDIZEM CD) 300 MG 24 hr capsule Take 300 mg by mouth daily.        . docusate sodium (COLACE) 100 MG capsule Take 100 mg by mouth daily.        . DULoxetine (CYMBALTA) 60 MG capsule Take 60 mg by mouth daily.        . fluticasone (FLONASE) 50 MCG/ACT nasal spray Place 1 spray into the nose every morning.        . Lactobacillus (ACIDOPHILUS) TABS Take 1  tablet by mouth daily.      . lamoTRIgine (LAMICTAL) 25 MG tablet Take 25 mg by mouth 2 (two) times daily.        . lisinopril (PRINIVIL,ZESTRIL) 40 MG tablet Take 40 mg by mouth daily.        . loratadine (CLARITIN) 10 MG tablet Take 10 mg by mouth daily.        . lovastatin (MEVACOR) 20 MG tablet Take 20 mg by mouth at bedtime.        . omeprazole (PRILOSEC) 20 MG capsule Take 20 mg by mouth daily.        . phenytoin (DILANTIN) 100 MG ER capsule Take 200-300 mg by mouth 2 (two) times daily. *Take 2 capsules in the morning and 3 capsules at bedtime*      . senna (SENOKOT) 8.6 MG tablet Take 2 tablets by mouth daily.      . sertraline (ZOLOFT) 100 MG tablet Take 100 mg by mouth every morning.        . tolterodine (DETROL) 2 MG tablet Take 2 mg by mouth 2 (two) times daily.       No current   facility-administered medications for this visit.    Allergies as of 09/27/2012 - Review Complete 09/27/2012  Allergen Reaction Noted  . Latex Rash 03/26/2011  . Other Rash 03/26/2011    Family History  Problem Relation Age of Onset  . Diabetes Mother   . Diabetes Sister   . Cancer Brother     "all over"   . Cancer Other   . Diabetes Other   . Colon cancer      unsure    History   Social History  . Marital Status: Single    Spouse Name: N/A    Number of Children: N/A  . Years of Education: N/A   Occupational History  . Not on file.   Social History Main Topics  . Smoking status: Former Smoker -- 5 years    Quit date: 03/25/1981  . Smokeless tobacco: Never Used  . Alcohol Use: No  . Drug Use: No  . Sexually Active: No   Other Topics Concern  . Not on file   Social History Narrative  . No narrative on file    Review of Systems: Gen: Denies any fever, chills, fatigue, weight loss, lack of appetite.  CV: +palpitations Resp: +DOE  GI: SEE HPI GU : Denies urinary burning, urinary frequency, urinary hesitancy MS: +lower extremity edema  Derm: Denies rash, itching, dry  skin Psych: +depression/anxiety, age-related memory loss  Heme: Denies bruising, bleeding, and enlarged lymph nodes.  Physical Exam: BP 125/70  Pulse 96  Temp(Src) 98.5 F (36.9 C) (Oral)  Ht 5' 3" (1.6 m)  Wt 281 lb (127.461 kg)  BMI 49.79 kg/m2 General:   Alert and oriented. Pleasant and cooperative. Well-nourished and well-developed. Very mild left-sided mouth drooping from prior stroke Head:  Normocephalic and atraumatic. Eyes:  Without icterus, sclera clear and conjunctiva pink.  Ears:  Normal auditory acuity. Nose:  No deformity, discharge,  or lesions. Mouth:  No deformity or lesions, oral mucosa pink. Poor dentition Neck:  Supple, without mass or thyromegaly. Lungs:  Clear to auscultation bilaterally. No wheezes, rales, or rhonchi. No distress.  Heart:  S1, S2 present without murmurs appreciated.  Abdomen:  +BS, soft, non-tender and non-distended. No HSM noted. No guarding or rebound. No masses appreciated.  Rectal:  Deferred until colonoscopy  Msk:  Left-sided weakness, in wheelchair but able to walk short distances Extremities:  LLE chronic 2+ edema, affected side from stroke  Neurologic:  Alert and  oriented x4;  grossly normal neurologically. Skin:  Intact without significant lesions or rashes. Cervical Nodes:  No significant cervical adenopathy. Psych:  Alert and cooperative. Normal mood and affect.    

## 2012-09-27 NOTE — Telephone Encounter (Signed)
Can we find out if it is a prior authorization issue or finances?

## 2012-09-27 NOTE — Telephone Encounter (Signed)
[facility] called about patient's prescriptions. Her insurance does not cover anything that was written for her today. Please advise and call [facility] at [phone]

## 2012-09-28 DIAGNOSIS — K5900 Constipation, unspecified: Secondary | ICD-10-CM | POA: Insufficient documentation

## 2012-09-28 DIAGNOSIS — K625 Hemorrhage of anus and rectum: Secondary | ICD-10-CM | POA: Insufficient documentation

## 2012-09-28 NOTE — Assessment & Plan Note (Addendum)
57 year old female with chronic constipation and intermittent low-volume hematochezia. She has tried and failed over-the-counter agents. Her last colonoscopy was at least 8 or 9 years ago per her report in [place]. Due to the amount of time that has passed, it is reasonable to pursue further evaluation of her lower GI tract. Likely rectal bleeding secondary to benign anorectal source; I question internal hemorrhoids or even possibly anal fissure due to her symptoms.  Start Amitiza 24 mcg po BID Anusol suppositories  Proceed with TCS with Dr. Jena Gauss in near future: the risks, benefits, and alternatives have been discussed with the patient in detail. The patient states understanding and desires to proceed. PROPOFOL FOR SEDATION DUE TO REPORTED HISTORY OF FAILED SEDATION IN REMOTE PAST   ADDENDUM 2/20: received colonoscopy reports from Dr. Gabriel Cirri in 2006, normal. Proceed with TCS as planned.

## 2012-09-28 NOTE — Assessment & Plan Note (Signed)
Question hemorrhoid or anal fissure etiology. Treat constipation, proceed with TCS as planned.

## 2012-09-28 NOTE — Progress Notes (Signed)
Faxed to PCP

## 2012-09-28 NOTE — Telephone Encounter (Signed)
Agree 

## 2012-09-29 ENCOUNTER — Encounter: Payer: Self-pay | Admitting: Gastroenterology

## 2012-09-29 NOTE — Patient Instructions (Signed)
Deborah Maddox  09/29/2012   Your procedure is scheduled on:  10/06/12  Report to [facility] at [phone] AM.  Call this number if you have problems the morning of surgery: [phone]   Remember:   Do not eat food or drink liquids after midnight.   Take these medicines the morning of surgery with A SIP OF WATER: cardizem, cymbalta, lisinopril, prilosec, dilantin, zoloft   Do not wear jewelry, make-up or nail polish.  Do not wear lotions, powders, or perfumes. You may wear deodorant.  Do not shave 48 hours prior to surgery. Men may shave face and neck.  Do not bring valuables to the hospital.  Contacts, dentures or bridgework may not be worn into surgery.  Leave suitcase in the car. After surgery it may be brought to your room.  For patients admitted to the hospital, checkout time is 11:00 AM the day of  discharge.   Patients discharged the day of surgery will not be allowed to drive  home.  Name and phone number of your driver: family  Special Instructions: N/A   Please read over the following fact sheets that you were given: Anesthesia Post-op Instructions and Care and Recovery After Surgery PATIENT INSTRUCTIONS POST-ANESTHESIA  IMMEDIATELY FOLLOWING SURGERY:  Do not drive or operate machinery for the first twenty four hours after surgery.  Do not make any important decisions for twenty four hours after surgery or while taking narcotic pain medications or sedatives.  If you develop intractable nausea and vomiting or a severe headache please notify your doctor immediately.  FOLLOW-UP:  Please make an appointment with your surgeon as instructed. You do not need to follow up with anesthesia unless specifically instructed to do so.  WOUND CARE INSTRUCTIONS (if applicable):  Keep a dry clean dressing on the anesthesia/puncture wound site if there is drainage.  Once the wound has quit draining you may leave it open to air.  Generally you should leave the bandage intact for twenty four hours unless  there is drainage.  If the epidural site drains for more than 36-48 hours please call the anesthesia department.  QUESTIONS?:  Please feel free to call your physician or the hospital operator if you have any questions, and they will be happy to assist you.      Colonoscopy A colonoscopy is an exam to evaluate your entire colon. In this exam, your colon is cleansed. A long fiberoptic tube is inserted through your rectum and into your colon. The fiberoptic scope (endoscope) is a long bundle of enclosed and very flexible fibers. These fibers transmit light to the area examined and send images from that area to your caregiver. Discomfort is usually minimal. You may be given a drug to help you sleep (sedative) during or prior to the procedure. This exam helps to detect lumps (tumors), polyps, inflammation, and areas of bleeding. Your caregiver may also take a small piece of tissue (biopsy) that will be examined under a microscope. LET YOUR CAREGIVER KNOW ABOUT:   Allergies to food or medicine.  Medicines taken, including vitamins, herbs, eyedrops, over-the-counter medicines, and creams.  Use of steroids (by mouth or creams).  Previous problems with anesthetics or numbing medicines.  History of bleeding problems or blood clots.  Previous surgery.  Other health problems, including diabetes and kidney problems.  Possibility of pregnancy, if this applies. BEFORE THE PROCEDURE   A clear liquid diet may be required for 2 days before the exam.  Ask your caregiver about changing or  stopping your regular medications.  Liquid injections (enemas) or laxatives may be required.  A large amount of electrolyte solution may be given to you to drink over a short period of time. This solution is used to clean out your colon.  You should be present 60 minutes prior to your procedure or as directed by your caregiver. AFTER THE PROCEDURE   If you received a sedative or pain relieving medication, you will  need to arrange for someone to drive you home.  Occasionally, there is a little blood passed with the first bowel movement. Do not be concerned. FINDING OUT THE RESULTS OF YOUR TEST Not all test results are available during your visit. If your test results are not back during the visit, make an appointment with your caregiver to find out the results. Do not assume everything is normal if you have not heard from your caregiver or the medical facility. It is important for you to follow up on all of your test results. HOME CARE INSTRUCTIONS   It is not unusual to pass moderate amounts of gas and experience mild abdominal cramping following the procedure. This is due to air being used to inflate your colon during the exam. Walking or a warm pack on your belly (abdomen) may help.  You may resume all normal meals and activities after sedatives and medicines have worn off.  Only take over-the-counter or prescription medicines for pain, discomfort, or fever as directed by your caregiver. Do not use aspirin or blood thinners if a biopsy was taken. Consult your caregiver for medicine usage if biopsies were taken. SEEK IMMEDIATE MEDICAL CARE IF:   You have a fever.  You pass large blood clots or fill a toilet with blood following the procedure. This may also occur 10 to 14 days following the procedure. This is more likely if a biopsy was taken.  You develop abdominal pain that keeps getting worse and cannot be relieved with medicine. Document Released: 07/24/2000 Document Revised: 10/19/2011 Document Reviewed: 03/08/2008 [facility] Patient Information 2013 [place], [place].

## 2012-09-30 ENCOUNTER — Encounter (HOSPITAL_COMMUNITY)
Admission: RE | Admit: 2012-09-30 | Discharge: 2012-09-30 | Disposition: A | Discharge status: Home / Self Care | Payer: Medicare Other | Source: Ambulatory Visit

## 2012-10-03 ENCOUNTER — Encounter (HOSPITAL_COMMUNITY)
Admission: RE | Admit: 2012-10-03 | Discharge: 2012-10-03 | Disposition: A | Discharge status: Home / Self Care | Payer: Medicare Other | Source: Ambulatory Visit | Attending: Internal Medicine | Admitting: Internal Medicine

## 2012-10-03 ENCOUNTER — Telehealth: Payer: Self-pay | Admitting: Gastroenterology

## 2012-10-03 ENCOUNTER — Encounter (HOSPITAL_COMMUNITY): Payer: Self-pay

## 2012-10-03 HISTORY — DX: Foot drop, unspecified foot: M21.379

## 2012-10-03 HISTORY — DX: Anxiety disorder, unspecified: F41.9

## 2012-10-03 HISTORY — DX: Other specified postprocedural states: Z98.890

## 2012-10-03 HISTORY — DX: Paralytic syndrome, unspecified: G83.9

## 2012-10-03 HISTORY — DX: Nausea with vomiting, unspecified: R11.2

## 2012-10-03 LAB — BASIC METABOLIC PANEL
BUN: 13 mg/dL (ref 6–23)
Calcium: 9.5 mg/dL (ref 8.4–10.5)
Creatinine, Ser: 0.51 mg/dL (ref 0.50–1.10)
GFR calc Af Amer: >90 mL/min (ref >90)
GFR calc non Af Amer: >90 mL/min (ref >90)
Potassium: 4 mEq/L (ref 3.5–5.1)

## 2012-10-03 NOTE — Telephone Encounter (Signed)
[facility] called and they need orders/prescription for fleet enema and dulcolax for this patient regarding her prep. Fax [phone]

## 2012-10-04 MED ORDER — BISACODYL 5 MG PO TBEC: 10.0000 mg | DELAYED_RELEASE_TABLET | Freq: Four times a day (QID) | ORAL | Status: DC

## 2012-10-04 MED ORDER — DISPOSABLE ENEMA 19-7 GM/118ML RE ENEM: 1.0000 | ENEMA | Freq: Once | RECTAL | Status: DC

## 2012-10-04 NOTE — Progress Notes (Signed)
Results faxed to PCP 

## 2012-10-04 NOTE — Telephone Encounter (Signed)
I have printed prescriptions.  

## 2012-10-06 ENCOUNTER — Encounter (HOSPITAL_COMMUNITY)
Admission: RE | Disposition: A | Discharge status: Home / Self Care | Payer: Self-pay | Source: Ambulatory Visit | Attending: Internal Medicine

## 2012-10-06 ENCOUNTER — Encounter (HOSPITAL_COMMUNITY): Payer: Self-pay

## 2012-10-06 ENCOUNTER — Encounter (HOSPITAL_COMMUNITY): Payer: Self-pay | Admitting: Anesthesiology

## 2012-10-06 ENCOUNTER — Ambulatory Visit (HOSPITAL_COMMUNITY): Payer: Medicare Other | Admitting: Anesthesiology

## 2012-10-06 ENCOUNTER — Ambulatory Visit (HOSPITAL_COMMUNITY)
Admission: RE | Admit: 2012-10-06 | Discharge: 2012-10-06 | Disposition: A | Discharge status: Home / Self Care | Payer: Medicare Other | Source: Ambulatory Visit | Attending: Internal Medicine | Admitting: Internal Medicine

## 2012-10-06 DIAGNOSIS — D126 Benign neoplasm of colon, unspecified: Secondary | ICD-10-CM

## 2012-10-06 DIAGNOSIS — K644 Residual hemorrhoidal skin tags: Secondary | ICD-10-CM | POA: Insufficient documentation

## 2012-10-06 DIAGNOSIS — K921 Melena: Secondary | ICD-10-CM | POA: Insufficient documentation

## 2012-10-06 DIAGNOSIS — K645 Perianal venous thrombosis: Secondary | ICD-10-CM

## 2012-10-06 DIAGNOSIS — Z01812 Encounter for preprocedural laboratory examination: Secondary | ICD-10-CM | POA: Insufficient documentation

## 2012-10-06 DIAGNOSIS — I10 Essential (primary) hypertension: Secondary | ICD-10-CM | POA: Insufficient documentation

## 2012-10-06 DIAGNOSIS — Z79899 Other long term (current) drug therapy: Secondary | ICD-10-CM | POA: Insufficient documentation

## 2012-10-06 DIAGNOSIS — K5900 Constipation, unspecified: Secondary | ICD-10-CM

## 2012-10-06 HISTORY — PX: COLONOSCOPY WITH PROPOFOL: SHX5780

## 2012-10-06 HISTORY — PX: POLYPECTOMY: SHX5525

## 2012-10-06 SURGERY — COLONOSCOPY WITH PROPOFOL
Anesthesia: Monitor Anesthesia Care | Site: Rectum

## 2012-10-06 MED ORDER — FENTANYL CITRATE 0.05 MG/ML IJ SOLN
25.0000 ug | INTRAMUSCULAR | Status: DC | PRN
  Administered 2012-10-06: 25 ug via INTRAVENOUS

## 2012-10-06 MED ORDER — FENTANYL CITRATE 0.05 MG/ML IJ SOLN
INTRAMUSCULAR | Status: AC
  Filled 2012-10-06: qty 2

## 2012-10-06 MED ORDER — FENTANYL CITRATE 0.05 MG/ML IJ SOLN
INTRAMUSCULAR | Status: DC | PRN
  Administered 2012-10-06: 25 ug via INTRAVENOUS
  Administered 2012-10-06: 50 ug via INTRAVENOUS

## 2012-10-06 MED ORDER — GLYCOPYRROLATE 0.2 MG/ML IJ SOLN
0.2000 mg | Freq: Once | INTRAMUSCULAR | Status: AC
  Administered 2012-10-06: 0.2 mg via INTRAVENOUS

## 2012-10-06 MED ORDER — STERILE WATER FOR IRRIGATION IR SOLN
Status: DC | PRN
  Administered 2012-10-06: 09:00:00

## 2012-10-06 MED ORDER — LACTATED RINGERS IV SOLN
INTRAVENOUS | Status: DC | PRN
  Administered 2012-10-06: 08:00:00 via INTRAVENOUS

## 2012-10-06 MED ORDER — MIDAZOLAM HCL 2 MG/2ML IJ SOLN
INTRAMUSCULAR | Status: AC
  Filled 2012-10-06: qty 2

## 2012-10-06 MED ORDER — PROPOFOL 10 MG/ML IV EMUL
INTRAVENOUS | Status: AC
  Filled 2012-10-06: qty 20

## 2012-10-06 MED ORDER — PHENYLEPHRINE HCL 10 MG/ML IJ SOLN
INTRAMUSCULAR | Status: AC
  Filled 2012-10-06: qty 1

## 2012-10-06 MED ORDER — LIDOCAINE HCL (CARDIAC) 10 MG/ML IV SOLN
INTRAVENOUS | Status: DC | PRN
  Administered 2012-10-06: 50 mg via INTRAVENOUS

## 2012-10-06 MED ORDER — ONDANSETRON HCL 4 MG/2ML IJ SOLN: 4.0000 mg | Freq: Once | INTRAMUSCULAR | Status: DC | PRN

## 2012-10-06 MED ORDER — EPHEDRINE SULFATE 50 MG/ML IJ SOLN
INTRAMUSCULAR | Status: AC
  Filled 2012-10-06: qty 1

## 2012-10-06 MED ORDER — LIDOCAINE HCL (PF) 1 % IJ SOLN
INTRAMUSCULAR | Status: AC
  Filled 2012-10-06: qty 5

## 2012-10-06 MED ORDER — PROPOFOL INFUSION 10 MG/ML OPTIME
INTRAVENOUS | Status: DC | PRN
  Administered 2012-10-06: 85 ug/kg/min via INTRAVENOUS
  Administered 2012-10-06: 50 ug/kg/min via INTRAVENOUS

## 2012-10-06 MED ORDER — ONDANSETRON HCL 4 MG/2ML IJ SOLN
INTRAMUSCULAR | Status: AC
  Filled 2012-10-06: qty 2

## 2012-10-06 MED ORDER — MIDAZOLAM HCL 5 MG/5ML IJ SOLN
INTRAMUSCULAR | Status: DC | PRN
  Administered 2012-10-06: 2 mg via INTRAVENOUS

## 2012-10-06 MED ORDER — FENTANYL CITRATE 0.05 MG/ML IJ SOLN: 25.0000 ug | INTRAMUSCULAR | Status: DC | PRN

## 2012-10-06 MED ORDER — MIDAZOLAM HCL 2 MG/2ML IJ SOLN
1.0000 mg | INTRAMUSCULAR | Status: DC | PRN
  Administered 2012-10-06: 2 mg via INTRAVENOUS

## 2012-10-06 MED ORDER — LACTATED RINGERS IV SOLN
INTRAVENOUS | Status: DC
  Administered 2012-10-06: 08:00:00 via INTRAVENOUS

## 2012-10-06 MED ORDER — ONDANSETRON HCL 4 MG/2ML IJ SOLN
4.0000 mg | Freq: Once | INTRAMUSCULAR | Status: AC
  Administered 2012-10-06: 4 mg via INTRAVENOUS

## 2012-10-06 MED ORDER — GLYCOPYRROLATE 0.2 MG/ML IJ SOLN
INTRAMUSCULAR | Status: AC
  Filled 2012-10-06: qty 1

## 2012-10-06 SURGICAL SUPPLY — 22 items
ELECT REM PT RETURN 9FT ADLT (ELECTROSURGICAL)
ELECTRODE REM PT RTRN 9FT ADLT (ELECTROSURGICAL) IMPLANT
FCP BXJMBJMB 240X2.8X (CUTTING FORCEPS)
FLOOR PAD 36X40 (MISCELLANEOUS) ×2
FORCEPS BIOP RAD 4 LRG CAP 4 (CUTTING FORCEPS) IMPLANT
FORCEPS BIOP RJ4 240 W/NDL (CUTTING FORCEPS)
FORCEPS BXJMBJMB 240X2.8X (CUTTING FORCEPS) IMPLANT
INJECTOR/SNARE I SNARE (MISCELLANEOUS) IMPLANT
LUBRICANT JELLY 4.5OZ STERILE (MISCELLANEOUS) ×1 IMPLANT
MANIFOLD NEPTUNE II (INSTRUMENTS) ×1 IMPLANT
NDL SCLEROTHERAPY 25GX240 (NEEDLE) IMPLANT
NEEDLE SCLEROTHERAPY 25GX240 (NEEDLE) IMPLANT
PAD FLOOR 36X40 (MISCELLANEOUS) IMPLANT
PROBE APC STR FIRE (PROBE) IMPLANT
PROBE INJECTION GOLD (MISCELLANEOUS)
PROBE INJECTION GOLD 7FR (MISCELLANEOUS) IMPLANT
SNARE ROTATE MED OVAL 20MM (MISCELLANEOUS) ×1 IMPLANT
SYR 50ML LL SCALE MARK (SYRINGE) IMPLANT
TRAP SPECIMEN MUCOUS 40CC (MISCELLANEOUS) ×1 IMPLANT
TUBING ENDO SMARTCAP PENTAX (MISCELLANEOUS) ×1 IMPLANT
TUBING IRRIGATION ENDOGATOR (MISCELLANEOUS) ×1 IMPLANT
WATER STERILE IRR 1000ML POUR (IV SOLUTION) ×1 IMPLANT

## 2012-10-06 NOTE — H&P (View-Only) (Signed)
Primary Care Physician:  Avon Gully, MD Primary Gastroenterologist:  Dr. Jena Gauss   Chief Complaint  Patient presents with  . Constipation  . Hemorrhoids    HPI:   Ms. Deborah Maddox is a 57 year old female who presents today at the request of Christin Gusler, NP, secondary to need for colonoscopy. She reports a history of constipation and scant hematochezia recently in the presence of straining. Has noted intermittent hematochezia in the past. +rectal discomfort. Sometimes knife-like, feels like it is tearing. Last colonoscopy 8 or 9 years ago in [place]. Pt denies polyps. States she was awake during the procedure.  Notes BM usually about once per week. Discomfort related to constipation, lower abdomen. Occasional nausea, no vomiting. Hates to throw up. Intermittent reflux but no dysphagia. On Prilosec daily. No wt loss or lack of appetite.   Resides at [facility].    Past Medical History  Diagnosis Date  . Seizures   . Coronary artery disease   . Hypertension   . Arthritis   . Sleep apnea   . Cancer     cervical  . Stroke     at age 74, left sided weakness    Past Surgical History  Procedure Laterality Date  . Leg surgery    . Cervical cone biopsy    . Breast surgery    . Tonsillectomy    . Leg surgery      left    Current Outpatient Prescriptions  Medication Sig Dispense Refill  . aspirin EC 325 MG tablet Take 325 mg by mouth daily.        . Cholecalciferol (VITAMIN D) 2000 UNITS CAPS Take 1 capsule by mouth daily.      . Cyanocobalamin (VITAMIN B-12 IJ) Inject as directed every 30 (thirty) days.      Marland Kitchen diltiazem (CARDIZEM CD) 300 MG 24 hr capsule Take 300 mg by mouth daily.        Marland Kitchen docusate sodium (COLACE) 100 MG capsule Take 100 mg by mouth daily.        . DULoxetine (CYMBALTA) 60 MG capsule Take 60 mg by mouth daily.        . fluticasone (FLONASE) 50 MCG/ACT nasal spray Place 1 spray into the nose every morning.        . Lactobacillus (ACIDOPHILUS) TABS Take 1  tablet by mouth daily.      Marland Kitchen lamoTRIgine (LAMICTAL) 25 MG tablet Take 25 mg by mouth 2 (two) times daily.        Marland Kitchen lisinopril (PRINIVIL,ZESTRIL) 40 MG tablet Take 40 mg by mouth daily.        Marland Kitchen loratadine (CLARITIN) 10 MG tablet Take 10 mg by mouth daily.        Marland Kitchen lovastatin (MEVACOR) 20 MG tablet Take 20 mg by mouth at bedtime.        Marland Kitchen omeprazole (PRILOSEC) 20 MG capsule Take 20 mg by mouth daily.        . phenytoin (DILANTIN) 100 MG ER capsule Take 200-300 mg by mouth 2 (two) times daily. *Take 2 capsules in the morning and 3 capsules at bedtime*      . senna (SENOKOT) 8.6 MG tablet Take 2 tablets by mouth daily.      . sertraline (ZOLOFT) 100 MG tablet Take 100 mg by mouth every morning.        . tolterodine (DETROL) 2 MG tablet Take 2 mg by mouth 2 (two) times daily.       No current  facility-administered medications for this visit.    Allergies as of 09/27/2012 - Review Complete 09/27/2012  Allergen Reaction Noted  . Latex Rash 03/26/2011  . Other Rash 03/26/2011    Family History  Problem Relation Age of Onset  . Diabetes Mother   . Diabetes Sister   . Cancer Brother     "all over"   . Cancer Other   . Diabetes Other   . Colon cancer      unsure    History   Social History  . Marital Status: Single    Spouse Name: N/A    Number of Children: N/A  . Years of Education: N/A   Occupational History  . Not on file.   Social History Main Topics  . Smoking status: Former Smoker -- 5 years    Quit date: 03/25/1981  . Smokeless tobacco: Never Used  . Alcohol Use: No  . Drug Use: No  . Sexually Active: No   Other Topics Concern  . Not on file   Social History Narrative  . No narrative on file    Review of Systems: Gen: Denies any fever, chills, fatigue, weight loss, lack of appetite.  CV: +palpitations Resp: +DOE  GI: SEE HPI GU : Denies urinary burning, urinary frequency, urinary hesitancy MS: +lower extremity edema  Derm: Denies rash, itching, dry  skin Psych: +depression/anxiety, age-related memory loss  Heme: Denies bruising, bleeding, and enlarged lymph nodes.  Physical Exam: BP 125/70  Pulse 96  Temp(Src) 98.5 F (36.9 C) (Oral)  Ht 5\' 3"  (1.6 m)  Wt 281 lb (127.461 kg)  BMI 49.79 kg/m2 General:   Alert and oriented. Pleasant and cooperative. Well-nourished and well-developed. Very mild left-sided mouth drooping from prior stroke Head:  Normocephalic and atraumatic. Eyes:  Without icterus, sclera clear and conjunctiva pink.  Ears:  Normal auditory acuity. Nose:  No deformity, discharge,  or lesions. Mouth:  No deformity or lesions, oral mucosa pink. Poor dentition Neck:  Supple, without mass or thyromegaly. Lungs:  Clear to auscultation bilaterally. No wheezes, rales, or rhonchi. No distress.  Heart:  S1, S2 present without murmurs appreciated.  Abdomen:  +BS, soft, non-tender and non-distended. No HSM noted. No guarding or rebound. No masses appreciated.  Rectal:  Deferred until colonoscopy  Msk:  Left-sided weakness, in wheelchair but able to walk short distances Extremities:  LLE chronic 2+ edema, affected side from stroke  Neurologic:  Alert and  oriented x4;  grossly normal neurologically. Skin:  Intact without significant lesions or rashes. Cervical Nodes:  No significant cervical adenopathy. Psych:  Alert and cooperative. Normal mood and affect.

## 2012-10-06 NOTE — Preoperative (Signed)
Beta Blockers   Reason not to administer Beta Blockers:Not Applicable 

## 2012-10-06 NOTE — Op Note (Signed)
Deborah Maddox, Deborah Maddox                   ACCOUNT NO.:  [id number]  MEDICAL RECORD NO.:  [id number]  LOCATION:  [facility]                          FACILITY:  [facility]  PHYSICIAN:  R. Roetta Sessions, MD FACP FACGDATE OF BIRTH:  04-12-1956  DATE OF PROCEDURE:  10/06/2012 DATE OF DISCHARGE:                              OPERATIVE REPORT   INDICATIONS FOR PROCEDURE:   57 year old lady with intermittent hematochezia in the setting of constipation.  Colonoscopy is now being done to further evaluate her symptoms.  Risks, benefits, limitations, alternatives, and imponderables have been discussed, questions answered. Because of difficulties with conscious sedation previously, she is being done under deep sedation with the help of Dr. Marcos Eke and associates.  PROCEDURE NOTE:  The patient was placed in left lateral decubitus position.  Monitoring done by Anesthesia.  INSTRUMENT:  Pentax video chip system.  Digital rectal exam revealed multiple external hemorrhoidal tags.  One large thrombosed hemorrhoid, the size of a small marble was present, anal papilla also present, appeared to have at least 3 external hemorrhoid tags with a thrombosed hemorrhoid being the largest one, otherwise negative.  Endoscopic findings:  Prep was good.  Examination of rectal mucosa including retroflexed view of the anal verge demonstrated no abnormalities.  Colon:  Colonic mucosa surveyed from the rectosigmoid junction, the left transverse, right colon to the appendiceal orifice, ileocecal valve, and cecum.  These structures well seen, photographed with the record.  From this level, scope was slowly and cautiously withdrawn.  All previous mucosal surfaces were again seen.  The patient had a 1 cm yellow soft submucosal nodule just distal to the ileocecal valve.  Positive pillow sign.  Consistent with a lipoma.  At the hepatic flexure, there was a 5 mm pedunculated polyp.  The remainder of the colonic mucosa appeared normal.  The  patient's colon was somewhat elongated and tortuous.  THERAPY/DIAGNOSTIC MANEUVERS PERFORMED:  The polyp mentioned above was cold snare removed.  The patient tolerated the procedure well.  Cecal withdrawal time 11 minutes.  IMPRESSION:  Prominent external hemorrhoids with 1 thrombosed hemorrhoid, anal papilla.  Normal rectum.  Hepatic flexure polyp-removed as described above.  Colonic lipoma.  RECOMMENDATIONS:  Follow up on pathology from polyp removed today.  The patient likely needs definitive surgical therapy for significant external/thrombosed hemorrhoids.  I will take the liberty of making her an appointment to see Dr. Franky Macho in the near future.     Jonathon Bellows, MD FACP [facility]     RMR/MEDQ  D:  10/06/2012  T:  10/06/2012  Job:  [id number]  cc:   Ninetta Lights D. Felecia Shelling, MD Fax: [phone]

## 2012-10-06 NOTE — Transfer of Care (Signed)
Immediate Anesthesia Transfer of Care Note  Patient: Deborah Maddox  Procedure(s) Performed: Procedure(s) with comments: COLONOSCOPY WITH PROPOFOL (N/A) - entered cecum @ 0905; total cecal withdrawal time=  POLYPECTOMY (N/A) - hepatic flexure polyp  Patient Location: PACU  Anesthesia Type:MAC  Level of Consciousness: awake, alert , oriented and patient cooperative  Airway & Oxygen Therapy: Patient Spontanous Breathing  Post-op Assessment: Report given to PACU RN and Post -op Vital signs reviewed and stable  Post vital signs: Reviewed and stable  Complications: No apparent anesthesia complications

## 2012-10-06 NOTE — Anesthesia Preprocedure Evaluation (Signed)
Anesthesia Evaluation  Patient identified by MRN, date of birth, ID band Patient awake    Reviewed: Allergy & Precautions, H&P , NPO status , Patient's Chart, lab work & pertinent test results  History of Anesthesia Complications (+) PONV  Airway Mallampati: II TM Distance: >3 FB     Dental  (+) Teeth Intact   Pulmonary sleep apnea , former smoker,  breath sounds clear to auscultation        Cardiovascular hypertension, + CAD Rhythm:Regular Rate:Normal     Neuro/Psych Seizures -,  Anxiety CVA, Residual Symptoms    GI/Hepatic   Endo/Other  Morbid obesity  Renal/GU      Musculoskeletal   Abdominal   Peds  Hematology   Anesthesia Other Findings   Reproductive/Obstetrics                           Anesthesia Physical Anesthesia Plan  ASA: III  Anesthesia Plan: MAC   Post-op Pain Management:    Induction: Intravenous  Airway Management Planned: Simple Face Mask  Additional Equipment:   Intra-op Plan:   Post-operative Plan:   Informed Consent: I have reviewed the patients History and Physical, chart, labs and discussed the procedure including the risks, benefits and alternatives for the proposed anesthesia with the patient or authorized representative who has indicated his/her understanding and acceptance.     Plan Discussed with:   Anesthesia Plan Comments:         Anesthesia Quick Evaluation

## 2012-10-06 NOTE — Addendum Note (Signed)
Addendum created 10/06/12 1101 by Marolyn Hammock, CRNA   Modules edited: Anesthesia Medication Administration

## 2012-10-06 NOTE — Interval H&P Note (Signed)
History and Physical Interval Note:  10/06/2012 8:30 AM  Deborah Maddox  has presented today for surgery, with the diagnosis of Rectal Bleeding  The various methods of treatment have been discussed with the patient and family. After consideration of risks, benefits and other options for treatment, the patient has consented to  Procedure(s) with comments: COLONOSCOPY WITH PROPOFOL (N/A) - 8:30 as a surgical intervention .  The patient's history has been reviewed, patient examined, no change in status, stable for surgery.  I have reviewed the patient's chart and labs.  Questions were answered to the patient's satisfaction.     [name]  Discuss diagnostic colonoscopy with patient.The risks, benefits, limitations, alternatives and imponderables have been reviewed with the patient. Questions have been answered. All parties are agreeable.

## 2012-10-06 NOTE — Anesthesia Postprocedure Evaluation (Signed)
  Anesthesia Post-op Note  Patient: Deborah Maddox  Procedure(s) Performed: Procedure(s) with comments: COLONOSCOPY WITH PROPOFOL (N/A) - entered cecum @ [id number]; total cecal withdrawal time=  POLYPECTOMY (N/A) - hepatic flexure polyp  Patient Location: PACU  Anesthesia Type:MAC  Level of Consciousness: awake, alert , oriented and patient cooperative  Airway and Oxygen Therapy: Patient Spontanous Breathing  Post-op Pain: mild  Post-op Assessment: Post-op Vital signs reviewed, Patient's Cardiovascular Status Stable, Respiratory Function Stable, Patent Airway, No signs of Nausea or vomiting and Pain level controlled  Post-op Vital Signs: Reviewed and stable  Complications: No apparent anesthesia complications

## 2012-10-07 ENCOUNTER — Encounter (HOSPITAL_COMMUNITY): Payer: Self-pay | Admitting: Internal Medicine

## 2012-10-11 ENCOUNTER — Encounter: Payer: Self-pay | Admitting: Internal Medicine

## 2012-10-12 ENCOUNTER — Encounter: Payer: Self-pay | Admitting: *Deleted

## 2012-10-13 ENCOUNTER — Telehealth: Payer: Self-pay | Admitting: Gastroenterology

## 2012-10-13 MED ORDER — LINACLOTIDE 290 MCG PO CAPS: 1.0000 | ORAL_CAPSULE | Freq: Every day | ORAL | Status: AC

## 2012-10-13 NOTE — Telephone Encounter (Signed)
Insurance refused payment for Amitiza.  Let's trial Linzess 290 mcg.  I have sent the prescription to the pharmacy. Please let pt know.

## 2012-10-21 ENCOUNTER — Encounter: Payer: Self-pay | Admitting: Internal Medicine

## 2012-11-27 ENCOUNTER — Emergency Department (HOSPITAL_COMMUNITY): Payer: Medicare Other

## 2012-11-27 ENCOUNTER — Emergency Department (HOSPITAL_COMMUNITY)
Admission: EM | Admit: 2012-11-27 | Discharge: 2012-11-27 | Disposition: A | Discharge status: Skilled Nursing Facility | Payer: Medicare Other | Attending: Emergency Medicine | Admitting: Emergency Medicine

## 2012-11-27 ENCOUNTER — Encounter (HOSPITAL_COMMUNITY): Payer: Self-pay | Admitting: Emergency Medicine

## 2012-11-27 DIAGNOSIS — Z9889 Other specified postprocedural states: Secondary | ICD-10-CM | POA: Insufficient documentation

## 2012-11-27 DIAGNOSIS — G40909 Epilepsy, unspecified, not intractable, without status epilepticus: Secondary | ICD-10-CM | POA: Insufficient documentation

## 2012-11-27 DIAGNOSIS — I2510 Atherosclerotic heart disease of native coronary artery without angina pectoris: Secondary | ICD-10-CM | POA: Insufficient documentation

## 2012-11-27 DIAGNOSIS — S92309A Fracture of unspecified metatarsal bone(s), unspecified foot, initial encounter for closed fracture: Secondary | ICD-10-CM | POA: Insufficient documentation

## 2012-11-27 DIAGNOSIS — S8001XA Contusion of right knee, initial encounter: Secondary | ICD-10-CM

## 2012-11-27 DIAGNOSIS — Z8673 Personal history of transient ischemic attack (TIA), and cerebral infarction without residual deficits: Secondary | ICD-10-CM | POA: Insufficient documentation

## 2012-11-27 DIAGNOSIS — Z8739 Personal history of other diseases of the musculoskeletal system and connective tissue: Secondary | ICD-10-CM | POA: Insufficient documentation

## 2012-11-27 DIAGNOSIS — F411 Generalized anxiety disorder: Secondary | ICD-10-CM | POA: Insufficient documentation

## 2012-11-27 DIAGNOSIS — Y9210 Unspecified residential institution as the place of occurrence of the external cause: Secondary | ICD-10-CM | POA: Insufficient documentation

## 2012-11-27 DIAGNOSIS — Z8669 Personal history of other diseases of the nervous system and sense organs: Secondary | ICD-10-CM | POA: Insufficient documentation

## 2012-11-27 DIAGNOSIS — I10 Essential (primary) hypertension: Secondary | ICD-10-CM | POA: Insufficient documentation

## 2012-11-27 DIAGNOSIS — Z7982 Long term (current) use of aspirin: Secondary | ICD-10-CM | POA: Insufficient documentation

## 2012-11-27 DIAGNOSIS — W19XXXA Unspecified fall, initial encounter: Secondary | ICD-10-CM

## 2012-11-27 DIAGNOSIS — Y9389 Activity, other specified: Secondary | ICD-10-CM | POA: Insufficient documentation

## 2012-11-27 DIAGNOSIS — Z79899 Other long term (current) drug therapy: Secondary | ICD-10-CM | POA: Insufficient documentation

## 2012-11-27 DIAGNOSIS — Z87891 Personal history of nicotine dependence: Secondary | ICD-10-CM | POA: Insufficient documentation

## 2012-11-27 DIAGNOSIS — M129 Arthropathy, unspecified: Secondary | ICD-10-CM | POA: Insufficient documentation

## 2012-11-27 DIAGNOSIS — Z91040 Latex allergy status: Secondary | ICD-10-CM | POA: Insufficient documentation

## 2012-11-27 DIAGNOSIS — S8000XA Contusion of unspecified knee, initial encounter: Secondary | ICD-10-CM | POA: Insufficient documentation

## 2012-11-27 DIAGNOSIS — Z8541 Personal history of malignant neoplasm of cervix uteri: Secondary | ICD-10-CM | POA: Insufficient documentation

## 2012-11-27 DIAGNOSIS — W050XXA Fall from non-moving wheelchair, initial encounter: Secondary | ICD-10-CM | POA: Insufficient documentation

## 2012-11-27 NOTE — ED Provider Notes (Signed)
History     CSN: [id number]  Arrival date & time 11/27/12  4098   First MD Initiated Contact with Patient 11/27/12 [phone]      Chief Complaint  Patient presents with  . Fall     HPI Pt was seen at 0705.  Per pt, EMS and NH report, pt with sudden onset and resolution of one episode of slip and fall PTA.  Pt states she was transferring from her bed to a wheelchair when she fell to the ground.  Pt c/o right knee, left foot and hand pain.  Denies hitting head, no LOC, no syncope, no AMS, no CP/SOB, no abd pain, no neck or back pain.    Past Medical History  Diagnosis Date  . Coronary artery disease   . Hypertension   . Arthritis   . Sleep apnea   . Cancer     cervical  . Stroke     at age 57, left sided weakness  . PONV (postoperative nausea and vomiting)   . Anxiety   . Seizures     last one 10 years ago  . Foot drop     left foot  . Paralysis     left arm    Past Surgical History  Procedure Laterality Date  . Leg surgery    . Cervical cone biopsy    . Breast surgery    . Tonsillectomy    . Leg surgery      left  . Colonoscopy  2006    Dr. Gabriel Cirri: normal colonoscopy   . Open reduction internal fixation (orif) hand      right hand and arm  . Dilation and curettage of uterus    . Colonoscopy with propofol N/A 10/06/2012    Procedure: COLONOSCOPY WITH PROPOFOL;  Surgeon: Corbin Ade, MD;  Location: [facility];  Service: Endoscopy;  Laterality: N/A;  entered cecum @ 0905; total cecal withdrawal time=   . Polypectomy N/A 10/06/2012    Procedure: POLYPECTOMY;  Surgeon: Corbin Ade, MD;  Location: [facility];  Service: Endoscopy;  Laterality: N/A;  hepatic flexure polyp    Family History  Problem Relation Age of Onset  . Diabetes Mother   . Diabetes Sister   . Cancer Brother     "all over"   . Cancer Other   . Diabetes Other   . Colon cancer      unsure    History  Substance Use Topics  . Smoking status: Former Smoker -- 5 years    Quit date:  03/25/1981  . Smokeless tobacco: Never Used  . Alcohol Use: No    OB History   Grav Para Term Preterm Abortions TAB SAB Ect Mult Living   3 2 2  1  1          Review of Systems ROS: Statement: All systems negative except as marked or noted in the HPI; Constitutional: Negative for fever and chills. ; ; Eyes: Negative for eye pain, redness and discharge. ; ; ENMT: Negative for ear pain, hoarseness, nasal congestion, sinus pressure and sore throat. ; ; Cardiovascular: Negative for chest pain, palpitations, diaphoresis, dyspnea and peripheral edema. ; ; Respiratory: Negative for cough, wheezing and stridor. ; ; Gastrointestinal: Negative for nausea, vomiting, diarrhea, abdominal pain, blood in stool, hematemesis, jaundice and rectal bleeding. . ; ; Genitourinary: Negative for dysuria, flank pain and hematuria. ; ; Musculoskeletal: +left foot, left hand, right knee pain. Negative for back pain and neck pain.  Negative for swelling and deformity.; ; Skin: Negative for pruritus, rash, abrasions, blisters, bruising and skin lesion.; ; Neuro: Negative for headache, lightheadedness and neck stiffness. Negative for weakness, altered level of consciousness , altered mental status, extremity weakness, paresthesias, involuntary movement, seizure and syncope.       Allergies  Latex and Other  Home Medications   Current Outpatient Rx  Name  Route  Sig  Dispense  Refill  . aspirin EC 325 MG tablet   Oral   Take 325 mg by mouth daily.           . bisacodyl (BISACODYL) 5 MG EC tablet   Oral   Take 2 tablets (10 mg total) by mouth 4 (four) times daily. Take 2 tablets at noon the day before the procedure and 2 tablets an hour after finishing liquid prep.   4 tablet   0   . Cyanocobalamin (VITAMIN B-12 IJ)   Injection   Inject as directed every 30 (thirty) days.         Marland Kitchen diltiazem (CARDIZEM CD) 300 MG 24 hr capsule   Oral   Take 300 mg by mouth daily.           Marland Kitchen docusate sodium (COLACE) 100  MG capsule   Oral   Take 100 mg by mouth daily.           . DULoxetine (CYMBALTA) 60 MG capsule   Oral   Take 60 mg by mouth daily.           . Lactobacillus (ACIDOPHILUS) TABS   Oral   Take 1 tablet by mouth daily.         Marland Kitchen lamoTRIgine (LAMICTAL) 25 MG tablet   Oral   Take 25 mg by mouth 2 (two) times daily.           Marland Kitchen Linaclotide (LINZESS) 290 MCG CAPS   Oral   Take 1 capsule by mouth daily. 30 minutes before breakfast.   30 capsule   3   . lisinopril (PRINIVIL,ZESTRIL) 40 MG tablet   Oral   Take 40 mg by mouth daily.           Marland Kitchen omeprazole (PRILOSEC) 20 MG capsule   Oral   Take 20 mg by mouth daily.           . phenytoin (DILANTIN) 100 MG ER capsule   Oral   Take 200-300 mg by mouth daily. Takes 2 capsules in the morning and 1 capsule in the evening.         . polyethylene glycol-electrolytes (TRILYTE) 420 G solution   Oral   Take 4,000 mLs by mouth as directed.   4000 mL   0   . senna (SENOKOT) 8.6 MG tablet   Oral   Take 2 tablets by mouth daily.         . sertraline (ZOLOFT) 100 MG tablet   Oral   Take 100 mg by mouth every morning.           . sodium phosphate (FLEET) enema   Rectal   Place 1 enema rectally once. follow package directions   135 mL   0   . tolterodine (DETROL) 2 MG tablet   Oral   Take 2 mg by mouth 2 (two) times daily.           BP 130/58  Pulse 78  Temp(Src) 98.9 F (37.2 C) (Oral)  Resp 18  SpO2 97%  Physical Exam 0730:  Physical examination: Vital signs and O2 SAT: Reviewed; Constitutional: Well developed, Well nourished, Well hydrated, In no acute distress; Head and Face: Normocephalic, Atraumatic; Eyes: EOMI, PERRL, No scleral icterus; ENMT: Mouth and pharynx normal, Left TM normal, Right TM normal, Mucous membranes moist; Neck: Supple, Trachea midline; Spine: No midline CS, TS, LS tenderness.; Cardiovascular: Regular rate and rhythm, No gallop; Respiratory: Breath sounds clear & equal bilaterally,  No rales, rhonchi, wheezes, Normal respiratory effort/excursion; Chest: Nontender, No deformity, Movement normal, No crepitus, No abrasions or ecchymosis.; Abdomen: Soft, Nontender, Nondistended, Normal bowel sounds, No abrasions or ecchymosis.; Genitourinary: No CVA tenderness;; Extremities: No deformity, Full range of motion major/large joints of bilat UE's and LE's without pain or tenderness to palp, Neurovascularly intact, Pulses normal, +FROM right knee, including able to lift extended RLE off stretcher, and extend right lower leg against resistance.  No ligamentous laxity.  No patellar or quad tendon step-offs.  NMS intact right foot, strong pedal pp. +plantarflexion of right foot w/calf squeeze.  No palpable gap right Achilles's tendon.  No proximal fibular head tenderness.  No edema, erythema, open wounds, ecchymosis or deformity. NT right hip/knee/ankle/foot.  +TTP left dorsal distal 5th metatarsal area without open wounds, no deformity, no edema. Left hand contracted per hx previous CVA, NT left fingers/hand/wrist/elbow. FROM left fingers and wrist without pain, no deformity, no edema, no ecchymosis, no open wounds. Pelvis stable; Neuro: AA&Ox3, Major CN grossly intact. No facial droop. Speech clear. +LUE and LLE weakness per hx CVA, otherwise no new gross focal motor deficits.; Skin: Color normal, Warm, Dry   ED Course  Procedures    MDM  MDM Reviewed: previous chart, nursing note and vitals Interpretation: x-ray   Dg Knee Complete 4 Views Right 11/27/2012  *RADIOLOGY REPORT*  Clinical Data: Fall, right knee pain  RIGHT KNEE - COMPLETE 4+ VIEW  Comparison: 08/11/2012  Findings: Moderate tricompartmental degenerative changes, most prominent in the patellofemoral compartment.  No fracture or dislocation is seen.  Stable ossification superior/lateral to the patella.  No suprapatellar knee joint effusion.  IMPRESSION: No fracture or dislocation is seen.  Moderate degenerative changes.   Original  Report Authenticated By: Charline Bills, M.D.    Dg Hand Complete Left 11/27/2012  *RADIOLOGY REPORT*  Clinical Data: Fall, left hand pain  LEFT HAND - COMPLETE 3+ VIEW  Comparison: None.  Findings: No fracture is seen.  Angulation of the fifth digit at the MCP joint may be related to patient positioning, although subluxation is not excluded.  Degenerative changes at the first carpometacarpal joint.  Visualized soft tissues are grossly unremarkable.  IMPRESSION: No fracture is seen.  Angulation of the fifth digit at the MCP joint, possibly positional.   Original Report Authenticated By: Charline Bills, M.D.    Dg Foot Complete Left 11/27/2012  *RADIOLOGY REPORT*  Clinical Data:  Pain, fell from wheelchair  LEFT FOOT - COMPLETE 3+ VIEW  Comparison: None.  Findings: The bones are diffusely mildly osteopenic.  A suggestion of a faint linear lucency through the cortex of the head of the fifth metatarsal.  The remainder of the visualized bones appear intact.  Very mild degenerative changes.  Calcaneal spurring at the plantar fascia insertion.  IMPRESSION:  1.  Suggestion of a faint linear lucency through the head of the fifth metatarsal (MTP joint) may represent a normal nutrient foramen or a nondisplaced fracture.  Recommend clinical correlation for point tenderness at this site. 2.  The bones appear mildly osteopenic.   Original Report Authenticated By:  Malachy Moan, M.D.       [phone]:  Left hand contracted at baseline due to previous hx CVA. NT left 5th finger (and all fingers) placed through FROM easily without pain. Angulation on above XR likely positional.  Pt is tender to palp 5th distal left metatarsal area, will place in post op shoe.  Pt has been talking with ED staff and watching TV without distress.  Will send back to NH.  Dx and testing d/w pt.  Questions answered.  Verb understanding, agreeable to d/c home with outpt f/u.       Laray Anger, DO 12/01/12 1558

## 2012-11-27 NOTE — ED Notes (Signed)
MD at bedside. 

## 2012-11-27 NOTE — ED Notes (Signed)
Pt from [facility], was transferring from bed to wheelchair when she fell to the ground, complains of pain to the L knee, right foot, and left hand. Denies hitting head or any LOC, mild swelling/bruising noted to right knee, no obvious deformities.

## 2012-11-27 NOTE — ED Notes (Signed)
[facility] notified of patient discharge. To send transport for patient.

## 2012-11-27 NOTE — ED Notes (Signed)
Patient with no complaints at this time. Respirations even and unlabored. Skin warm/dry. Discharge instructions reviewed with patient at this time. Patient given opportunity to voice concerns/ask questions. Patient discharged at this time and left Emergency Department via wheelchair with [facility] Staff

## 2012-11-27 NOTE — ED Notes (Signed)
Patient moved to room 14 for better visibility from Nurse's station due to falls.

## 2012-12-01 ENCOUNTER — Encounter: Payer: Self-pay | Admitting: Orthopedic Surgery

## 2012-12-01 ENCOUNTER — Ambulatory Visit (INDEPENDENT_AMBULATORY_CARE_PROVIDER_SITE_OTHER): Payer: Medicare Other | Admitting: Orthopedic Surgery

## 2012-12-01 VITALS — BP 142/68 | Ht 64.0 in | Wt 260.0 lb

## 2012-12-01 DIAGNOSIS — M79672 Pain in left foot: Secondary | ICD-10-CM

## 2012-12-01 DIAGNOSIS — M79609 Pain in unspecified limb: Secondary | ICD-10-CM

## 2012-12-01 MED ORDER — HYDROCODONE-ACETAMINOPHEN 5-325 MG PO TABS: 1.0000 | ORAL_TABLET | Freq: Four times a day (QID) | ORAL | Status: DC | PRN

## 2012-12-01 NOTE — Progress Notes (Signed)
Patient ID: Deborah Maddox, female   DOB: Oct 02, 1955, 57 y.o.   MRN: [id number] Chief Complaint  Patient presents with  . 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 She wears an AFO to keep her foot plantigrade  She has multiple medical problems  Review of systems as recorded in the medical record and is scanned in the documents section  BP 142/68  Ht 5\' 4"  (1.626 m)  Wt 260 lb (117.935 kg)  BMI 44.61 kg/m2  She has some obesity she is awake alert and oriented her mood is normal she is ambulatory in a wheelchair today however her foot is not swollen but it does have some hyperemia she has decreased range of motion in the ankle joint although remain stable muscle tone is normal without any atrophy skin other than the Roux per is normal she has a small amount of tenderness at the metatarsophalangeal joint area of the fifth metatarsal  Gross sensation is normal pulse is strong  X-ray inconclusive  X-rays of the knee show calcification chronic stable  X-ray of the hand also showed questionable injury but no definitive fracture  Foot pain cannot rule out fracture doesn't seem to be bothering her too much we'll give her a dose of pain medicine and have her take that for 10 days  Follow up as needed

## 2012-12-03 ENCOUNTER — Emergency Department (HOSPITAL_COMMUNITY): Payer: Medicare Other

## 2012-12-03 ENCOUNTER — Encounter (HOSPITAL_COMMUNITY): Payer: Self-pay

## 2012-12-03 ENCOUNTER — Inpatient Hospital Stay (HOSPITAL_COMMUNITY)
Admission: EM | Admit: 2012-12-03 | Discharge: 2012-12-09 | DRG: 175 | Disposition: A | Discharge status: Nursing Home (Includes ICF) | Payer: Medicare Other | Attending: Internal Medicine | Admitting: Internal Medicine

## 2012-12-03 DIAGNOSIS — I459 Conduction disorder, unspecified: Secondary | ICD-10-CM | POA: Diagnosis present

## 2012-12-03 DIAGNOSIS — I2699 Other pulmonary embolism without acute cor pulmonale: Principal | ICD-10-CM

## 2012-12-03 DIAGNOSIS — I10 Essential (primary) hypertension: Secondary | ICD-10-CM

## 2012-12-03 DIAGNOSIS — Z87891 Personal history of nicotine dependence: Secondary | ICD-10-CM

## 2012-12-03 DIAGNOSIS — R079 Chest pain, unspecified: Secondary | ICD-10-CM

## 2012-12-03 DIAGNOSIS — Z79899 Other long term (current) drug therapy: Secondary | ICD-10-CM

## 2012-12-03 DIAGNOSIS — IMO0002 Reserved for concepts with insufficient information to code with codable children: Secondary | ICD-10-CM

## 2012-12-03 DIAGNOSIS — R000 Tachycardia, unspecified: Secondary | ICD-10-CM | POA: Diagnosis present

## 2012-12-03 DIAGNOSIS — Z6841 Body Mass Index (BMI) 40.0 and over, adult: Secondary | ICD-10-CM

## 2012-12-03 DIAGNOSIS — R002 Palpitations: Secondary | ICD-10-CM

## 2012-12-03 DIAGNOSIS — F411 Generalized anxiety disorder: Secondary | ICD-10-CM | POA: Diagnosis present

## 2012-12-03 DIAGNOSIS — Z833 Family history of diabetes mellitus: Secondary | ICD-10-CM

## 2012-12-03 DIAGNOSIS — Z7982 Long term (current) use of aspirin: Secondary | ICD-10-CM

## 2012-12-03 DIAGNOSIS — E871 Hypo-osmolality and hyponatremia: Secondary | ICD-10-CM | POA: Diagnosis present

## 2012-12-03 DIAGNOSIS — Z91040 Latex allergy status: Secondary | ICD-10-CM

## 2012-12-03 DIAGNOSIS — G4730 Sleep apnea, unspecified: Secondary | ICD-10-CM | POA: Diagnosis present

## 2012-12-03 DIAGNOSIS — I4891 Unspecified atrial fibrillation: Secondary | ICD-10-CM | POA: Diagnosis present

## 2012-12-03 DIAGNOSIS — Z8541 Personal history of malignant neoplasm of cervix uteri: Secondary | ICD-10-CM

## 2012-12-03 DIAGNOSIS — E8809 Other disorders of plasma-protein metabolism, not elsewhere classified: Secondary | ICD-10-CM | POA: Diagnosis present

## 2012-12-03 DIAGNOSIS — I2510 Atherosclerotic heart disease of native coronary artery without angina pectoris: Secondary | ICD-10-CM | POA: Diagnosis present

## 2012-12-03 DIAGNOSIS — G40909 Epilepsy, unspecified, not intractable, without status epilepticus: Secondary | ICD-10-CM | POA: Diagnosis present

## 2012-12-03 DIAGNOSIS — J189 Pneumonia, unspecified organism: Secondary | ICD-10-CM | POA: Diagnosis present

## 2012-12-03 DIAGNOSIS — R51 Headache: Secondary | ICD-10-CM | POA: Diagnosis present

## 2012-12-03 DIAGNOSIS — D649 Anemia, unspecified: Secondary | ICD-10-CM | POA: Diagnosis present

## 2012-12-03 DIAGNOSIS — I4892 Unspecified atrial flutter: Secondary | ICD-10-CM

## 2012-12-03 DIAGNOSIS — M129 Arthropathy, unspecified: Secondary | ICD-10-CM | POA: Diagnosis present

## 2012-12-03 DIAGNOSIS — Z823 Family history of stroke: Secondary | ICD-10-CM

## 2012-12-03 DIAGNOSIS — I69959 Hemiplegia and hemiparesis following unspecified cerebrovascular disease affecting unspecified side: Secondary | ICD-10-CM

## 2012-12-03 LAB — APTT: aPTT: 30 seconds (ref 24–37)

## 2012-12-03 LAB — COMPREHENSIVE METABOLIC PANEL
AST: 39 U/L — ABNORMAL HIGH (ref 0–37)
Albumin: 2.8 g/dL — ABNORMAL LOW (ref 3.5–5.2)
CO2: 25 mEq/L (ref 19–32)
Calcium: 8.6 mg/dL (ref 8.4–10.5)
Creatinine, Ser: 0.51 mg/dL (ref 0.50–1.10)
GFR calc non Af Amer: >90 mL/min (ref >90)
Total Protein: 7.4 g/dL (ref 6.0–8.3)

## 2012-12-03 LAB — PROTIME-INR: INR: 1.21 (ref 0.00–1.49)

## 2012-12-03 LAB — CBC
MCH: 32.3 pg (ref 26.0–34.0)
MCHC: 33.5 g/dL (ref 30.0–36.0)
MCV: 96.2 fL (ref 78.0–100.0)
Platelets: 475 10*3/uL — ABNORMAL HIGH (ref 150–400)
RDW: 13.4 % (ref 11.5–15.5)

## 2012-12-03 LAB — TROPONIN I: Troponin I: <0.3 ng/mL (ref <0.30)

## 2012-12-03 LAB — MRSA PCR SCREENING: MRSA by PCR: POSITIVE — AB

## 2012-12-03 LAB — PHENYTOIN LEVEL, TOTAL: Phenytoin Lvl: 13.1 ug/mL (ref 10.0–20.0)

## 2012-12-03 MED ORDER — PANTOPRAZOLE SODIUM 40 MG PO TBEC
40.0000 mg | DELAYED_RELEASE_TABLET | Freq: Every day | ORAL | Status: DC
  Administered 2012-12-03 – 2012-12-09 (×7): 40 mg via ORAL
  Filled 2012-12-03 (×7): qty 1

## 2012-12-03 MED ORDER — MORPHINE SULFATE 2 MG/ML IJ SOLN
2.0000 mg | INTRAMUSCULAR | Status: DC | PRN
  Administered 2012-12-03 – 2012-12-08 (×6): 2 mg via INTRAVENOUS
  Filled 2012-12-03 (×7): qty 1

## 2012-12-03 MED ORDER — ALBUTEROL SULFATE (5 MG/ML) 0.5% IN NEBU
2.5000 mg | INHALATION_SOLUTION | Freq: Once | RESPIRATORY_TRACT | Status: AC
  Administered 2012-12-03: 2.5 mg via RESPIRATORY_TRACT
  Filled 2012-12-03: qty 0.5

## 2012-12-03 MED ORDER — LAMOTRIGINE 25 MG PO TABS
25.0000 mg | ORAL_TABLET | Freq: Two times a day (BID) | ORAL | Status: DC
  Administered 2012-12-03 – 2012-12-09 (×12): 25 mg via ORAL
  Filled 2012-12-03 (×23): qty 1

## 2012-12-03 MED ORDER — PHENYTOIN SODIUM EXTENDED 100 MG PO CAPS
300.0000 mg | ORAL_CAPSULE | Freq: Every day | ORAL | Status: DC
  Administered 2012-12-03 – 2012-12-08 (×6): 300 mg via ORAL
  Filled 2012-12-03 (×6): qty 3

## 2012-12-03 MED ORDER — ENOXAPARIN SODIUM 120 MG/0.8ML ~~LOC~~ SOLN
120.0000 mg | Freq: Two times a day (BID) | SUBCUTANEOUS | Status: DC
  Administered 2012-12-03 – 2012-12-05 (×4): 120 mg via SUBCUTANEOUS
  Filled 2012-12-03 (×4): qty 0.8

## 2012-12-03 MED ORDER — SODIUM CHLORIDE 0.9 % IJ SOLN: 3.0000 mL | INTRAMUSCULAR | Status: DC | PRN

## 2012-12-03 MED ORDER — ALUM & MAG HYDROXIDE-SIMETH 200-200-20 MG/5ML PO SUSP
30.0000 mL | Freq: Four times a day (QID) | ORAL | Status: DC | PRN
  Administered 2012-12-04: 30 mL via ORAL
  Filled 2012-12-03: qty 30

## 2012-12-03 MED ORDER — BIOTENE DRY MOUTH MT LIQD
15.0000 mL | Freq: Two times a day (BID) | OROMUCOSAL | Status: DC
  Administered 2012-12-03 (×2): 15 mL via OROMUCOSAL

## 2012-12-03 MED ORDER — MUPIROCIN 2 % EX OINT
1.0000 "application " | TOPICAL_OINTMENT | Freq: Two times a day (BID) | CUTANEOUS | Status: AC
  Administered 2012-12-03 – 2012-12-07 (×10): 1 via NASAL
  Filled 2012-12-03 (×2): qty 22

## 2012-12-03 MED ORDER — BISACODYL 5 MG PO TBEC: 10.0000 mg | DELAYED_RELEASE_TABLET | Freq: Four times a day (QID) | ORAL | Status: DC

## 2012-12-03 MED ORDER — PHENYTOIN SODIUM EXTENDED 100 MG PO CAPS: 100.0000 mg | ORAL_CAPSULE | Freq: Every day | ORAL | Status: DC

## 2012-12-03 MED ORDER — PREDNISONE 50 MG PO TABS
60.0000 mg | ORAL_TABLET | Freq: Once | ORAL | Status: AC
  Administered 2012-12-03: 60 mg via ORAL
  Filled 2012-12-03: qty 1

## 2012-12-03 MED ORDER — DOCUSATE SODIUM 100 MG PO CAPS
100.0000 mg | ORAL_CAPSULE | Freq: Every day | ORAL | Status: DC
  Administered 2012-12-03 – 2012-12-09 (×7): 100 mg via ORAL
  Filled 2012-12-03 (×9): qty 1

## 2012-12-03 MED ORDER — ONDANSETRON HCL 4 MG/2ML IJ SOLN
4.0000 mg | Freq: Four times a day (QID) | INTRAMUSCULAR | Status: DC | PRN
  Administered 2012-12-03 – 2012-12-04 (×2): 4 mg via INTRAVENOUS
  Filled 2012-12-03 (×2): qty 2

## 2012-12-03 MED ORDER — IOHEXOL 350 MG/ML SOLN
100.0000 mL | Freq: Once | INTRAVENOUS | Status: AC | PRN
  Administered 2012-12-03: 100 mL via INTRAVENOUS

## 2012-12-03 MED ORDER — PHENYTOIN SODIUM EXTENDED 100 MG PO CAPS
200.0000 mg | ORAL_CAPSULE | Freq: Every day | ORAL | Status: DC
  Administered 2012-12-04 – 2012-12-09 (×6): 200 mg via ORAL
  Filled 2012-12-03: qty 2
  Filled 2012-12-03: qty 1
  Filled 2012-12-03 (×4): qty 2

## 2012-12-03 MED ORDER — DEXTROSE 5 % IV SOLN
1.0000 g | INTRAVENOUS | Status: DC
  Administered 2012-12-03: 1 g via INTRAVENOUS
  Filled 2012-12-03 (×3): qty 10

## 2012-12-03 MED ORDER — SODIUM CHLORIDE 0.9 % IJ SOLN
3.0000 mL | Freq: Two times a day (BID) | INTRAMUSCULAR | Status: DC
  Administered 2012-12-03 – 2012-12-08 (×8): 3 mL via INTRAVENOUS

## 2012-12-03 MED ORDER — MORPHINE SULFATE 4 MG/ML IJ SOLN
2.0000 mg | Freq: Once | INTRAMUSCULAR | Status: AC
  Administered 2012-12-03: 2 mg via INTRAVENOUS
  Filled 2012-12-03: qty 1

## 2012-12-03 MED ORDER — AZITHROMYCIN 250 MG PO TABS
500.0000 mg | ORAL_TABLET | Freq: Once | ORAL | Status: AC
  Administered 2012-12-03: 500 mg via ORAL
  Filled 2012-12-03: qty 2

## 2012-12-03 MED ORDER — ACETAMINOPHEN 325 MG PO TABS
650.0000 mg | ORAL_TABLET | Freq: Four times a day (QID) | ORAL | Status: DC | PRN
  Administered 2012-12-08: 650 mg via ORAL
  Filled 2012-12-03: qty 2

## 2012-12-03 MED ORDER — ENOXAPARIN SODIUM 120 MG/0.8ML ~~LOC~~ SOLN
1.0000 mg/kg | Freq: Two times a day (BID) | SUBCUTANEOUS | Status: DC
  Administered 2012-12-03: 110 mg via SUBCUTANEOUS
  Filled 2012-12-03 (×5): qty 0.8

## 2012-12-03 MED ORDER — DULOXETINE HCL 60 MG PO CPEP
60.0000 mg | ORAL_CAPSULE | Freq: Every day | ORAL | Status: DC
  Administered 2012-12-03 – 2012-12-09 (×7): 60 mg via ORAL
  Filled 2012-12-03 (×9): qty 1

## 2012-12-03 MED ORDER — CHLORHEXIDINE GLUCONATE CLOTH 2 % EX PADS
6.0000 | MEDICATED_PAD | Freq: Every day | CUTANEOUS | Status: AC
  Administered 2012-12-03 – 2012-12-07 (×4): 6 via TOPICAL

## 2012-12-03 MED ORDER — ACETAMINOPHEN 650 MG RE SUPP: 650.0000 mg | Freq: Four times a day (QID) | RECTAL | Status: DC | PRN

## 2012-12-03 MED ORDER — DILTIAZEM HCL ER COATED BEADS 180 MG PO CP24
300.0000 mg | ORAL_CAPSULE | Freq: Every day | ORAL | Status: DC
  Administered 2012-12-03 – 2012-12-04 (×2): 300 mg via ORAL
  Filled 2012-12-03 (×4): qty 1

## 2012-12-03 MED ORDER — SERTRALINE HCL 50 MG PO TABS
100.0000 mg | ORAL_TABLET | Freq: Every day | ORAL | Status: DC
  Administered 2012-12-03 – 2012-12-09 (×7): 100 mg via ORAL
  Filled 2012-12-03 (×2): qty 2
  Filled 2012-12-03: qty 1
  Filled 2012-12-03 (×2): qty 2
  Filled 2012-12-03: qty 1
  Filled 2012-12-03 (×3): qty 2

## 2012-12-03 MED ORDER — LISINOPRIL 10 MG PO TABS
40.0000 mg | ORAL_TABLET | Freq: Every day | ORAL | Status: DC
  Administered 2012-12-03 – 2012-12-09 (×7): 40 mg via ORAL
  Filled 2012-12-03 (×7): qty 4

## 2012-12-03 MED ORDER — PHENYTOIN SODIUM EXTENDED 100 MG PO CAPS
200.0000 mg | ORAL_CAPSULE | Freq: Every day | ORAL | Status: DC
  Administered 2012-12-03: 200 mg via ORAL
  Filled 2012-12-03: qty 2

## 2012-12-03 MED ORDER — SODIUM CHLORIDE 0.9 % IV SOLN
Freq: Once | INTRAVENOUS | Status: AC
  Administered 2012-12-03: 03:00:00 via INTRAVENOUS

## 2012-12-03 MED ORDER — MORPHINE SULFATE 4 MG/ML IJ SOLN
4.0000 mg | Freq: Once | INTRAMUSCULAR | Status: AC
  Administered 2012-12-03: 4 mg via INTRAVENOUS
  Filled 2012-12-03: qty 1

## 2012-12-03 MED ORDER — ONDANSETRON HCL 4 MG/2ML IJ SOLN
4.0000 mg | Freq: Once | INTRAMUSCULAR | Status: AC
  Administered 2012-12-03: 4 mg via INTRAVENOUS
  Filled 2012-12-03: qty 2

## 2012-12-03 MED ORDER — ONDANSETRON HCL 4 MG PO TABS: 4.0000 mg | ORAL_TABLET | Freq: Four times a day (QID) | ORAL | Status: DC | PRN

## 2012-12-03 MED ORDER — SENNA 8.6 MG PO TABS
2.0000 | ORAL_TABLET | Freq: Every day | ORAL | Status: DC
  Administered 2012-12-03 – 2012-12-09 (×7): 17.2 mg via ORAL
  Filled 2012-12-03: qty 1
  Filled 2012-12-03 (×2): qty 2
  Filled 2012-12-03: qty 1
  Filled 2012-12-03 (×3): qty 2
  Filled 2012-12-03: qty 1

## 2012-12-03 MED ORDER — HYDROCODONE-ACETAMINOPHEN 5-325 MG PO TABS
1.0000 | ORAL_TABLET | ORAL | Status: DC | PRN
  Administered 2012-12-03 – 2012-12-09 (×10): 1 via ORAL
  Filled 2012-12-03 (×11): qty 1

## 2012-12-03 MED ORDER — SODIUM CHLORIDE 0.9 % IV SOLN: 250.0000 mL | INTRAVENOUS | Status: DC | PRN

## 2012-12-03 NOTE — Progress Notes (Signed)
ANTICOAGULATION CONSULT NOTE - Initial Consult  Pharmacy Consult for Lovenox Indication: pulmonary embolus  Allergies  Allergen Reactions  . Latex Rash  . Other Rash    HEART  MONITOR TAPE: severe rash, bleeding    Patient Measurements: Height: 5\' 5"  (165.1 cm) Weight: 271 lb 3.2 oz (123.016 kg) IBW/kg (Calculated) : 57 Heparin Dosing Weight: 120Kg  Vital Signs: Temp: 98.5 F (36.9 C) (04/26 0824) Temp src: Oral (04/26 0824) BP: 141/67 mmHg (04/26 0824) Pulse Rate: 72 (04/26 0824)  Labs:  Recent Labs  12/03/12 0535 12/03/12 0608 12/03/12 0623  HGB 11.1*  --   --   HCT 33.1*  --   --   PLT 475*  --   --   APTT  --  30  --   LABPROT  --  15.1  --   INR  --  1.21  --   CREATININE  --  0.51  --   TROPONINI  --   --  <0.30   Estimated Creatinine Clearance: 99.7 ml/min (by C-G formula based on Cr of 0.51).  Medical History: Past Medical History  Diagnosis Date  . Coronary artery disease   . Hypertension   . Arthritis   . Sleep apnea   . Cancer     cervical  . Stroke     at age 26, left sided weakness  . PONV (postoperative nausea and vomiting)   . Anxiety   . Seizures     last one 10 years ago  . Foot drop     left foot  . Paralysis     left arm   Medications:  Scheduled:  . [COMPLETED] sodium chloride   Intravenous Once  . [COMPLETED] albuterol  2.5 mg Nebulization Once  . [COMPLETED] albuterol  2.5 mg Nebulization Once  . antiseptic oral rinse  15 mL Mouth Rinse BID  . [COMPLETED] azithromycin  500 mg Oral Once  . cefTRIAXone (ROCEPHIN)  IV  1 g Intravenous Q24H  . diltiazem  300 mg Oral Daily  . docusate sodium  100 mg Oral Daily  . DULoxetine  60 mg Oral Daily  . enoxaparin (LOVENOX) injection  120 mg Subcutaneous Q12H  . lamoTRIgine  25 mg Oral BID  . lisinopril  40 mg Oral Daily  . [COMPLETED] morphine  2 mg Intravenous Once  . [COMPLETED] morphine  2 mg Intravenous Once  . [COMPLETED] morphine  4 mg Intravenous Once  . [COMPLETED]  ondansetron  4 mg Intravenous Once  . pantoprazole  40 mg Oral Daily  . phenytoin  100 mg Oral QHS  . phenytoin  200 mg Oral Daily  . [COMPLETED] predniSONE  60 mg Oral Once  . senna  2 tablet Oral Daily  . sertraline  100 mg Oral Daily  . sodium chloride  3 mL Intravenous Q12H  . [DISCONTINUED] bisacodyl  10 mg Oral QID  . [DISCONTINUED] enoxaparin (LOVENOX) injection  1 mg/kg Subcutaneous Q12H  . [DISCONTINUED] phenytoin  200-300 mg Oral Daily    Assessment: 57yo female started on full dose Lovenox for PE.  Pt is obese with good renal fxn.  Estimated Creatinine Clearance: 99.7 ml/min (by C-G formula based on Cr of 0.51).  Goal of Therapy:  Anti-Xa level 0.6-1.2 units/ml 4hrs after LMWH dose given Monitor platelets by anticoagulation protocol: Yes   Plan:  Lovenox 1mg /Kg SQ q12hrs (120mg ) Monitor renal fxn and CBC  Devorah Givhan A 12/03/2012,9:05 AM

## 2012-12-03 NOTE — ED Notes (Signed)
Chest discomfort with cough.

## 2012-12-03 NOTE — ED Provider Notes (Signed)
History     CSN: [id number]  Arrival date & time 12/03/12  0120   First MD Initiated Contact with Patient 12/03/12 0206      Chief Complaint  Patient presents with  . Chest Pain  . Cough    (Consider location/radiation/quality/duration/timing/severity/associated sxs/prior treatment) HPI Deborah Maddox is a 57 y.o. female with a h/o CAD, SA who presents to the Emergency Department complaining of chest discomfort and a cough, shortness of breath. [facility] home with paralysis of her left side as a result of CVA. She denies fever, chills, nausea, vomiting.  PCP Dr. Felecia Shelling    Past Medical History  Diagnosis Date  . Coronary artery disease   . Hypertension   . Arthritis   . Sleep apnea   . Cancer     cervical  . Stroke     at age 62, left sided weakness  . PONV (postoperative nausea and vomiting)   . Anxiety   . Seizures     last one 10 years ago  . Foot drop     left foot  . Paralysis     left arm    Past Surgical History  Procedure Laterality Date  . Leg surgery    . Cervical cone biopsy    . Breast surgery    . Tonsillectomy    . Leg surgery      left  . Colonoscopy  2006    Dr. Gabriel Cirri: normal colonoscopy   . Open reduction internal fixation (orif) hand      right hand and arm  . Dilation and curettage of uterus    . Colonoscopy with propofol N/A 10/06/2012    Procedure: COLONOSCOPY WITH PROPOFOL;  Surgeon: Corbin Ade, MD;  Location: [facility];  Service: Endoscopy;  Laterality: N/A;  entered cecum @ 0905; total cecal withdrawal time=   . Polypectomy N/A 10/06/2012    Procedure: POLYPECTOMY;  Surgeon: Corbin Ade, MD;  Location: [facility];  Service: Endoscopy;  Laterality: N/A;  hepatic flexure polyp    Family History  Problem Relation Age of Onset  . Diabetes Mother   . Diabetes Sister   . Cancer Brother     "all over"   . Cancer Other   . Diabetes Other   . Colon cancer      unsure    History  Substance Use Topics  .  Smoking status: Former Smoker -- 5 years    Quit date: 03/25/1981  . Smokeless tobacco: Never Used  . Alcohol Use: No    OB History   Grav Para Term Preterm Abortions TAB SAB Ect Mult Living   3 2 2  1  1          Review of Systems  Constitutional: Negative for fever.       10 Systems reviewed and are negative for acute change except as noted in the HPI.  HENT: Negative for congestion.   Eyes: Negative for discharge and redness.  Respiratory: Positive for cough and shortness of breath.   Cardiovascular: Negative for chest pain.  Gastrointestinal: Negative for vomiting and abdominal pain.  Musculoskeletal: Negative for back pain.  Skin: Negative for rash.  Neurological: Negative for syncope, numbness and headaches.       Left sided paralysis  Psychiatric/Behavioral:       No behavior change.    Allergies  Latex and Other  Home Medications   Current Outpatient Rx  Name  Route  Sig  Dispense  Refill  . aspirin EC 325 MG tablet   Oral   Take 325 mg by mouth daily.           . bisacodyl (BISACODYL) 5 MG EC tablet   Oral   Take 2 tablets (10 mg total) by mouth 4 (four) times daily. Take 2 tablets at noon the day before the procedure and 2 tablets an hour after finishing liquid prep.   4 tablet   0   . Cyanocobalamin (VITAMIN B-12 IJ)   Injection   Inject as directed every 30 (thirty) days.         Marland Kitchen diltiazem (CARDIZEM CD) 300 MG 24 hr capsule   Oral   Take 300 mg by mouth daily.           Marland Kitchen docusate sodium (COLACE) 100 MG capsule   Oral   Take 100 mg by mouth daily.           . DULoxetine (CYMBALTA) 60 MG capsule   Oral   Take 60 mg by mouth daily.           Marland Kitchen HYDROcodone-acetaminophen (NORCO/VICODIN) 5-325 MG per tablet   Oral   Take 1 tablet by mouth every 6 (six) hours as needed for pain.   40 tablet   0   . Lactobacillus (ACIDOPHILUS) TABS   Oral   Take 1 tablet by mouth daily.         Marland Kitchen lamoTRIgine (LAMICTAL) 25 MG tablet   Oral    Take 25 mg by mouth 2 (two) times daily.           Marland Kitchen Linaclotide (LINZESS) 290 MCG CAPS   Oral   Take 1 capsule by mouth daily. 30 minutes before breakfast.   30 capsule   3   . lisinopril (PRINIVIL,ZESTRIL) 40 MG tablet   Oral   Take 40 mg by mouth daily.           Marland Kitchen omeprazole (PRILOSEC) 20 MG capsule   Oral   Take 20 mg by mouth daily.           . phenytoin (DILANTIN) 100 MG ER capsule   Oral   Take 200-300 mg by mouth daily. Takes 2 capsules in the morning and 1 capsule in the evening.         . polyethylene glycol-electrolytes (TRILYTE) 420 G solution   Oral   Take 4,000 mLs by mouth as directed.   4000 mL   0   . senna (SENOKOT) 8.6 MG tablet   Oral   Take 2 tablets by mouth daily.         . sertraline (ZOLOFT) 100 MG tablet   Oral   Take 100 mg by mouth every morning.           . sodium phosphate (FLEET) enema   Rectal   Place 1 enema rectally once. follow package directions   135 mL   0   . tolterodine (DETROL) 2 MG tablet   Oral   Take 2 mg by mouth 2 (two) times daily.           BP 145/78  Pulse 104  Temp(Src) 100.3 F (37.9 C) (Oral)  Resp 20  Ht 5\' 4"  (1.626 m)  Wt 240 lb (108.863 kg)  BMI 41.18 kg/m2  SpO2 92%  Physical Exam  Nursing note and vitals reviewed. Constitutional: She is oriented to person, place, and time. She appears well-developed and well-nourished.  Awake, alert, nontoxic appearance.morbidly obese  HENT:  Head: Normocephalic and atraumatic.  Right Ear: External ear normal.  Left Ear: External ear normal.  Eyes: EOM are normal. Pupils are equal, round, and reactive to light.  Neck: Neck supple.  Cardiovascular: Normal rate and intact distal pulses.   Pulmonary/Chest: Effort normal and breath sounds normal. She exhibits no tenderness.  Abdominal: Soft. Bowel sounds are normal. There is no tenderness. There is no rebound.  Musculoskeletal: She exhibits no tenderness.  Baseline ROM, no obvious new focal  weakness.left sided paralysis  Neurological: She is alert and oriented to person, place, and time.  Mental status and motor strength appears baseline for patient and situation.  Skin: No rash noted.  Psychiatric: She has a normal mood and affect.    ED Course  Procedures (including critical care time)  Labs Reviewed - No data to display Ct Angio Chest Pe W/cm &/or Wo Cm  12/03/2012  *RADIOLOGY REPORT*  Clinical Data: Cough, chest pain, back pain, nausea.  CT ANGIOGRAPHY CHEST  Technique:  Multidetector CT imaging of the chest using the standard protocol during bolus administration of intravenous contrast. Multiplanar reconstructed images including MIPs were obtained and reviewed to evaluate the vascular anatomy.  Contrast: OMNIPAQUE IOHEXOL 350 MG/ML SOLN  Comparison: None.  Findings: Technically adequate study with moderately good opacification of the central and segmental pulmonary arteries. There are filling defects in the right lower lobe pulmonary arteries and in the right upper lobe pulmonary artery associated with consolidation consistent with pulmonary embolus and infarct. Small right pleural effusion.  Atelectasis or infiltration is also present in the left lung base although no specific pulmonary embolus is identified on the left.  Normal heart size.  Normal caliber thoracic aorta.  No significant lymphadenopathy in the chest.  The esophagus is decompressed.  No pneumothorax.  Airways appear patent.  Visualized portions of the upper abdominal organs demonstrate cholelithiasis with a large stone in the gallbladder.  IMPRESSION: Positive study for pulmonary emboli in the right upper lung and right lower lung.  Atelectasis or infiltration in these areas consistent with infarct.  Small right pleural effusion. Atelectasis or infiltration is also present in the left lung base without definite infarct.  Results were discussed with Dr. Colon Branch at 0554 hours on 12/03/2012.   Original Report  Authenticated By: Burman Nieves, M.D.    Dg Chest Port 1 View  12/03/2012  *RADIOLOGY REPORT*  Clinical Data: Chest pain  PORTABLE CHEST - 1 VIEW  Comparison: 06/04/2012  Findings: Shallow inspiration.  Cardiac enlargement.  Normal pulmonary vascularity.  There is interval development of increased density in the right lung base which could be due to atelectasis or infiltration.  No blunting of costophrenic angles.  No pneumothorax.  Tortuous aorta.  IMPRESSION: Shallow inspiration with developing infiltration or atelectasis in the right lung base.  Stable cardiac enlargement.   Original Report Authenticated By: Burman Nieves, M.D.    Medications  0.9 %  sodium chloride infusion ( Intravenous New Bag/Given 12/03/12 0326)  morphine 4 MG/ML injection 2 mg (2 mg Intravenous Given 12/03/12 0325)  ondansetron (ZOFRAN) injection 4 mg (4 mg Intravenous Given 12/03/12 0325)  albuterol (PROVENTIL) (5 MG/ML) 0.5% nebulizer solution 2.5 mg (2.5 mg Nebulization Given 12/03/12 0309)  albuterol (PROVENTIL) (5 MG/ML) 0.5% nebulizer solution 2.5 mg (2.5 mg Nebulization Given 12/03/12 0450)  azithromycin (ZITHROMAX) tablet 500 mg (500 mg Oral Given 12/03/12 0443)  predniSONE (DELTASONE) tablet 60 mg (60 mg Oral Given 12/03/12 0443)  morphine 4 MG/ML injection 2 mg (2 mg Intravenous Given 12/03/12 0443)  iohexol (OMNIPAQUE) 350 MG/ML injection 100 mL (100 mLs Intravenous Contrast Given 12/03/12 0533)  morphine 4 MG/ML injection 4 mg (4 mg Intravenous Given 12/03/12 1610)      0420 Patient breathing better. Still has pain to the right side. Reviewed xray results with patient.  0430 Pain is continuous. Given additional analgesic..Will arrange for CT angio to r/o PE. 5:57 AM:  T/C From Dr. Tenny Craw, radiology. He has advised that patient has PEs on the right side.  6:09 AM:  T/C to Dr. Rito Ehrlich, hospitalist, case discussed, including:  HPI, pertinent PM/SHx, VS/PE, dx testing, ED course and treatment.  Agreeable to  admission.  Requests to write temporary orders, telemetry bed to team 2.. [phone] Dr. Rito Ehrlich advised that patient's PCP is Dr. Felecia Shelling.  6:16 AM:  T/C to Dr. Ouida Sills, case discussed, including:  HPI, pertinent PM/SHx, VS/PE, dx testing, ED course and treatment.  Agreeable to admission. .  Requests to write temporary orders, telemetry bed.    MDM  Patient with shortness of breath and right sided chest pain. Given morphine, albuterol with little affect on pain, mild improvement in breathing. Given additional analgesic. Sent for CT which shows PEs. Spoke with Dr. Ouida Sills who will admit the patient for Dr.Fanta. The patient appears reasonably stabilized for admission considering the current resources, flow, and capabilities available in the ED at this time, and I doubt any other [facility] requiring further screening and/or treatment in the ED prior to admission.  MDM Reviewed: nursing note and vitals Interpretation: labs, CT scan and x-ray         Nicoletta Dress. Colon Branch, MD 12/04/12 [phone]

## 2012-12-03 NOTE — Progress Notes (Signed)
12/03/12 1709 Discussed rationale for contact precautions and MRSA PCR positive standing orders with patient this afternoon. Stated understood. Also provided education regarding contact precautions and importance of handwashing with soap and water on entering and leaving room with patient and her sister this afternoon. Stated she understood, preferred not to wear gown. States will follow handwashing instructions. Earnstine Regal, RN

## 2012-12-03 NOTE — ED Notes (Signed)
Report  Called to [place], [place] on unit 300.

## 2012-12-03 NOTE — Progress Notes (Signed)
CRITICAL VALUE ALERT  Critical value received:  MRSA PCR positive  Date of notification:  12/03/12  Time of notification:  1115  Critical value read back:yes  Nurse who received alert:  A. Aundria Rud, RN  MD notified (1st page):  Dr Ouida Sills  Time of first page:  1142 [facility] cell-phone)  MD notified (2nd page):  Time of second page:  Responding MD:  Dr Ouida Sills  Time MD responded:  1142  Notified of positive MRSA PCR and MRSA positive standing orders implemented. Stated okay. Earnstine Regal, RN

## 2012-12-04 LAB — CBC
HCT: 33.1 % — ABNORMAL LOW (ref 36.0–46.0)
Hemoglobin: 11.1 g/dL — ABNORMAL LOW (ref 12.0–15.0)
MCH: 32.6 pg (ref 26.0–34.0)
MCV: 97.4 fL (ref 78.0–100.0)
Platelets: 437 10*3/uL — ABNORMAL HIGH (ref 150–400)
RBC: 3.4 MIL/uL — ABNORMAL LOW (ref 3.87–5.11)
WBC: 8.9 10*3/uL (ref 4.0–10.5)

## 2012-12-04 MED ORDER — DILTIAZEM HCL 100 MG IV SOLR
10.0000 mg/h | INTRAVENOUS | Status: DC
  Administered 2012-12-04: 15 mg/h via INTRAVENOUS
  Administered 2012-12-04 – 2012-12-05 (×2): 10 mg/h via INTRAVENOUS
  Filled 2012-12-04: qty 100

## 2012-12-04 MED ORDER — CHLORHEXIDINE GLUCONATE 0.12 % MT SOLN
15.0000 mL | Freq: Two times a day (BID) | OROMUCOSAL | Status: DC
  Administered 2012-12-04 – 2012-12-09 (×9): 15 mL via OROMUCOSAL
  Filled 2012-12-04 (×10): qty 15

## 2012-12-04 MED ORDER — DEXTROSE 5 % IV SOLN
1.0000 g | INTRAVENOUS | Status: DC
  Administered 2012-12-04 – 2012-12-08 (×5): 1 g via INTRAVENOUS
  Filled 2012-12-04 (×11): qty 10

## 2012-12-04 MED ORDER — BIOTENE DRY MOUTH MT LIQD
15.0000 mL | Freq: Two times a day (BID) | OROMUCOSAL | Status: DC
  Administered 2012-12-04 – 2012-12-08 (×9): 15 mL via OROMUCOSAL

## 2012-12-04 NOTE — Progress Notes (Signed)
12/04/12 1610 Patient in a-flutter, tachy HR in the 150s per CMT notification this afternoon. Dr Ouida Sills notified per Derrick Ravel, RN this afternoon, orders received to transfer to stepdown unit. EKG obtained as ordered. Discussed transfer to stepdown unit with patient, stated understood. Pt stable, denies dyspnea or discomfort at this time. States occasional back/neck discomfort. Asked nursing to notify her sister, Deborah Maddox of transfer. Spoke with Deborah Maddox, notified of transfer to stepdown. Stated understood and would appreciate updates as necessary once patient transferred. Will notify stepdown nurse. Earnstine Regal, RN

## 2012-12-04 NOTE — Progress Notes (Signed)
12/04/12 1645 Called report to Kathyrn Sheriff, RN. Pt transferred in stable condition to stepdown unit via bed, accompanied by nurse and nurse tech. Earnstine Regal, RN

## 2012-12-05 DIAGNOSIS — I4892 Unspecified atrial flutter: Secondary | ICD-10-CM

## 2012-12-05 DIAGNOSIS — I2699 Other pulmonary embolism without acute cor pulmonale: Secondary | ICD-10-CM

## 2012-12-05 DIAGNOSIS — R079 Chest pain, unspecified: Secondary | ICD-10-CM

## 2012-12-05 DIAGNOSIS — I517 Cardiomegaly: Secondary | ICD-10-CM

## 2012-12-05 LAB — CBC
HCT: 32.6 % — ABNORMAL LOW (ref 36.0–46.0)
MCH: 32.9 pg (ref 26.0–34.0)
MCV: 97.6 fL (ref 78.0–100.0)
Platelets: 447 10*3/uL — ABNORMAL HIGH (ref 150–400)
RDW: 13.6 % (ref 11.5–15.5)
WBC: 7.4 10*3/uL (ref 4.0–10.5)

## 2012-12-05 LAB — BASIC METABOLIC PANEL
BUN: 11 mg/dL (ref 6–23)
CO2: 30 mEq/L (ref 19–32)
Calcium: 8.4 mg/dL (ref 8.4–10.5)
Chloride: 100 mEq/L (ref 96–112)
Creatinine, Ser: 0.48 mg/dL — ABNORMAL LOW (ref 0.50–1.10)
Glucose, Bld: 119 mg/dL — ABNORMAL HIGH (ref 70–99)

## 2012-12-05 MED ORDER — WARFARIN VIDEO
Freq: Once | Status: AC
  Administered 2012-12-05: 15:00:00

## 2012-12-05 MED ORDER — RIVAROXABAN 10 MG PO TABS: 15.0000 mg | ORAL_TABLET | Freq: Two times a day (BID) | ORAL | Status: DC

## 2012-12-05 MED ORDER — PATIENT'S GUIDE TO USING COUMADIN BOOK
Freq: Once | Status: AC
  Administered 2012-12-05: 11:00:00
  Filled 2012-12-05: qty 1

## 2012-12-05 MED ORDER — ENOXAPARIN SODIUM 120 MG/0.8ML ~~LOC~~ SOLN
120.0000 mg | Freq: Two times a day (BID) | SUBCUTANEOUS | Status: DC
  Administered 2012-12-05 – 2012-12-09 (×7): 120 mg via SUBCUTANEOUS
  Filled 2012-12-05 (×14): qty 0.8

## 2012-12-05 MED ORDER — SODIUM CHLORIDE 0.9 % IV SOLN
INTRAVENOUS | Status: DC
  Administered 2012-12-05: 500 mL via INTRAVENOUS
  Administered 2012-12-08: 20 mL/h via INTRAVENOUS

## 2012-12-05 MED ORDER — WARFARIN SODIUM 7.5 MG PO TABS
7.5000 mg | ORAL_TABLET | Freq: Once | ORAL | Status: AC
  Administered 2012-12-05: 7.5 mg via ORAL
  Filled 2012-12-05: qty 1

## 2012-12-05 MED ORDER — WARFARIN VIDEO
Freq: Once | Status: AC
  Administered 2012-12-05: 11:00:00

## 2012-12-05 MED ORDER — DILTIAZEM HCL 60 MG PO TABS
60.0000 mg | ORAL_TABLET | Freq: Four times a day (QID) | ORAL | Status: DC
  Administered 2012-12-05 – 2012-12-07 (×8): 60 mg via ORAL
  Filled 2012-12-05 (×8): qty 1

## 2012-12-05 MED ORDER — PATIENT'S GUIDE TO USING COUMADIN BOOK
Freq: Once | Status: AC
  Administered 2012-12-05: 15:00:00
  Filled 2012-12-05: qty 1

## 2012-12-05 MED ORDER — WARFARIN - PHARMACIST DOSING INPATIENT
Status: DC
  Administered 2012-12-05 – 2012-12-07 (×2)

## 2012-12-05 NOTE — Progress Notes (Signed)
Subjective:  Patient was admitted yesterday due to rt side chest pain. Patient was found to have pulmonary Embolism and atrial fibrillation. She is started on anticoagulation and Iv Cardizem. No new complaint.  Objective: Vital signs in last 24 hours: Temp:  [98 F (36.7 C)-98.6 F (37 C)] 98 F (36.7 C) (04/28 0738) Pulse Rate:  [57-114] 84 (04/28 0630) Resp:  [13-23] 17 (04/28 0630) BP: (86-180)/(28-150) 146/62 mmHg (04/28 0630) SpO2:  [89 %-99 %] 94 % (04/28 0630) Weight:  [126.8 kg (279 lb 8.7 oz)] 126.8 kg (279 lb 8.7 oz) (04/28 0530) Weight change: 3.784 kg (8 lb 5.5 oz) Last BM Date: 12/02/12  Intake/Output from previous day: 04/27 0701 - 04/28 0700 In: 859.2 [P.O.:600; I.V.:209.2; IV Piggyback:50] Out: 900 [Urine:900]  PHYSICAL EXAM General appearance: alert, no distress and slowed mentation Resp: diminished breath sounds bilaterally and rhonchi bilaterally Cardio: irregularly irregular rhythm GI: soft, non-tender; bowel sounds normal; no masses,  no organomegaly Extremities: extremities normal, atraumatic, no cyanosis or edema  Lab Results:    @labtest @ [facility] No results found for this basename: PHART, PCO2, PO2ART, TCO2, HCO3,  in the last 72 hours CULTURES Recent Results (from the past 240 hour(s))  MRSA PCR SCREENING     Status: Abnormal   Collection Time    12/03/12  8:56 AM      Result Value Range Status   MRSA by PCR POSITIVE (*) NEGATIVE Final   Comment:            The GeneXpert MRSA Assay (FDA     approved for NASAL specimens     only), is one component of a     comprehensive MRSA colonization     surveillance program. It is not     intended to diagnose MRSA     infection nor to guide or     monitor treatment for     MRSA infections.     CRITICAL RESULT CALLED TO, READ BACK BY AND VERIFIED WITH:     ROGERS, A. AT 11:15AM ON 12/03/12 BY PRUITT, C.   Studies/Results: No results found.  Medications: I have reviewed the patient's current  medications.  Assesment: 1. Pulmonary Embolism 2. Atrial fibrillation 3.hypertension 4. H/O CAD 5. CVA with lt side hemiplegia  Active Problems:   * No active hospital problems. *    Plan: Continue anticoagulation Continue IV Cardizem Pulmonary consult Cardiology consult Continue regular treatment    LOS: 2 days   Finesse Fielder 12/05/2012, 7:54 AM

## 2012-12-05 NOTE — Clinical Social Work Psychosocial (Signed)
    Clinical Social Work Department BRIEF PSYCHOSOCIAL ASSESSMENT 12/05/2012  Patient:  Deborah Maddox, Deborah Maddox     Account Number:  [id number]     Admit date:  12/03/2012  Clinical Social Worker:  Santa Genera, CLINICAL SOCIAL WORKER  Date/Time:  12/05/2012 12:00 N  Referred by:  CSW  Date Referred:  12/05/2012 Referred for  ALF Placement   Other Referral:   Interview type:  Patient Other interview type:   Also spoke w Reggie, ALF administrator    PSYCHOSOCIAL DATA Living Status:  FACILITY Admitted from facility:  [facility] Level of care:  Assisted Living Primary support name:  Alonza Smoker Primary support relationship to patient:  SIBLING Degree of support available:   Adequate as patient lives in ALF    CURRENT CONCERNS Current Concerns  Post-Acute Placement   Other Concerns:    SOCIAL WORK ASSESSMENT / PLAN CSW met w patient at bedside, patient alert and oriented. Patient's nephew in room, patient agreeable to him remaining during assessment.  Patient admitted from [facility] where she has lived for several years.  Prior to that she lived at [facility] in [place], is disabled and has required ALF level of care for many years per patient. Patient has two children, but placed them for adoption when they were toddlers and has not heard from them in "decades."  Has limited family support, mother recently had a stroke and sister Alonza Smoker) who sometimes helps patient w planning and decision making has been involved w mother's care.  Patient appears quite isolated, but verbalizes that she "tries not to be a burden to anyone." Nephew confirmed that sister Alonza Smoker has been helpful in past,  and may be attempting to become a legal guardian at some point.    Spoke w Reggie, administrator of [facility].  ALF is willing to take patient at discharge.  Says patient ambulates in a wheelchair, primarily in bed or chair. Needs one person assist to transfer from bed to  chair and facility staff assist w ADLs, including bathing, dressing and toileting.  Patient takes breathing treatments regularly and does not receive any home health at this time.    CSW will monitor patient and continue to coordinate discharge planning w goal of patient returning to ALF per patient stated desire.   Assessment/plan status:  Psychosocial Support/Ongoing Assessment of Needs Other assessment/ plan:   Information/referral to community resources:   None needed at this time.    PATIENT'S/FAMILY'S RESPONSE TO PLAN OF CARE: Patient appreciative.   Santa Genera, LCSW Clinical Social Worker [phone])

## 2012-12-05 NOTE — Progress Notes (Signed)
NAMEADDYSEN, Deborah Maddox                   ACCOUNT NO.:  [id number]  MEDICAL RECORD NO.:  [id number]  LOCATION:                                 FACILITY:  PHYSICIAN:  Kingsley Callander. Ouida Sills, MD       DATE OF BIRTH:  April 18, 1956  DATE OF PROCEDURE:  12/04/2012 DATE OF DISCHARGE:                                PROGRESS NOTE   Deborah Maddox is not having as severe pains on her right side now.  She is not experiencing fever.  PHYSICAL EXAMINATION:  GENERAL:  She is not dyspneic appearing. VITAL SIGNS:  Temperature is 97.9 with a pulse of 87, respirations 18, and blood pressure of 122/71, oxygen saturation is 99%. LUNGS:  Clear. HEART:  Regular with no murmurs. ABDOMEN:  Nontender. EXTREMITIES:  No edema.  IMPRESSION/PLAN: 1. Pulmonary emboli.  Continue Lovenox. 2. Pneumonia.  Continue Rocephin.  White count is 8.9. 3. Anemia.  Hemoglobin 11.1. 4. Sleep apnea. 5. History of stroke.  Continue modification of therapy to Xarelto tomorrow.  She is not requiring intravenous pain medication this morning.     Kingsley Callander. Ouida Sills, MD     ROF/MEDQ  D:  12/04/2012  T:  12/05/2012  Job:  [id number]

## 2012-12-05 NOTE — Progress Notes (Signed)
*  PRELIMINARY RESULTS* Echocardiogram 2D Echocardiogram has been performed.  Conrad [place] 12/05/2012, 3:35 PM

## 2012-12-05 NOTE — Progress Notes (Signed)
UR Chart Review Completed  

## 2012-12-05 NOTE — Plan of Care (Signed)
Problem: Consults Goal: Atrial Arhythmia Patient Education (See Patient Education module for education specifics.) Outcome: Progressing Patient on cardizem gtt for episode of Afib RVR  Problem: Phase I Progression Outcomes Goal: Heart rate or rhythm control medication Outcome: Progressing On cardizem gtt

## 2012-12-05 NOTE — Consult Note (Signed)
Consult requested by: Dr. Felecia Shelling Consult requested for pulmonary embolism:  HPI: This is a 57 year old with multiple medical problems who was admitted to the hospital with pulmonary embolism. She also had what appeared to be left lower lobe pneumonia. She was started on Lovenox and antibiotics. She has past medical history which is complicated and includes coronary artery disease hypertension sleep apnea previous stroke seizures and residual left-sided weakness from her stroke  Past Medical History  Diagnosis Date  . Coronary artery disease   . Hypertension   . Arthritis   . Sleep apnea   . Cancer     cervical  . Stroke     at age 71, left sided weakness  . PONV (postoperative nausea and vomiting)   . Anxiety   . Seizures     last one 10 years ago  . Foot drop     left foot  . Paralysis     left arm     Family History  Problem Relation Age of Onset  . Diabetes Mother   . Diabetes Sister   . Cancer Brother     "all over"   . Cancer Other   . Diabetes Other   . Colon cancer      unsure     History   Social History  . Marital Status: Single    Spouse Name: N/A    Number of Children: N/A  . Years of Education: N/A   Social History Main Topics  . Smoking status: Former Smoker -- 5 years    Quit date: 03/25/1981  . Smokeless tobacco: Never Used  . Alcohol Use: No  . Drug Use: No  . Sexually Active: No   Other Topics Concern  . None   Social History Narrative  . None     ROS: She denies any problems with her legs. She has not had any hemoptysis.    Objective: Vital signs in last 24 hours: Temp:  [98 F (36.7 C)-98.6 F (37 C)] 98 F (36.7 C) (04/28 0738) Pulse Rate:  [57-114] 77 (04/28 0800) Resp:  [13-23] 16 (04/28 0800) BP: (86-180)/(28-150) 114/77 mmHg (04/28 0800) SpO2:  [89 %-99 %] 99 % (04/28 0800) Weight:  [126.8 kg (279 lb 8.7 oz)] 126.8 kg (279 lb 8.7 oz) (04/28 0530) Weight change: 3.784 kg (8 lb 5.5 oz) Last BM Date:  12/02/12  Intake/Output from previous day: 04/27 0701 - 04/28 0700 In: 889.2 [P.O.:600; I.V.:239.2; IV Piggyback:50] Out: 900 [Urine:900]  PHYSICAL EXAM She is awake and alert. She has nasal CPAP on now. Her chest is clear. Her heart is regular without gallop. Her abdomen is soft no masses are felt she has left-sided weakness. I do not feel a definite cord in any of the veins of her legs. Her HEENT examination is grossly intact her neck is supple without masses  Lab Results: Basic Metabolic Panel:  Recent Labs  16/10/96 0608 12/05/12 0517  NA 133* 139  K 3.8 3.8  CL 97 100  CO2 25 30  GLUCOSE 146* 119*  BUN 9 11  CREATININE 0.51 0.48*  CALCIUM 8.6 8.4   Liver Function Tests:  Recent Labs  12/03/12 0608  AST 39*  ALT 31  ALKPHOS 195*  BILITOT 0.4  PROT 7.4  ALBUMIN 2.8*   No results found for this basename: LIPASE, AMYLASE,  in the last 72 hours No results found for this basename: AMMONIA,  in the last 72 hours CBC:  Recent Labs  12/04/12 0610  12/05/12 0517  WBC 8.9 7.4  HGB 11.1* 11.0*  HCT 33.1* 32.6*  MCV 97.4 97.6  PLT 437* 447*   Cardiac Enzymes:  Recent Labs  12/03/12 0623  TROPONINI <0.30   BNP: No results found for this basename: PROBNP,  in the last 72 hours D-Dimer: No results found for this basename: DDIMER,  in the last 72 hours CBG: No results found for this basename: GLUCAP,  in the last 72 hours Hemoglobin A1C: No results found for this basename: HGBA1C,  in the last 72 hours Fasting Lipid Panel: No results found for this basename: CHOL, HDL, LDLCALC, TRIG, CHOLHDL, LDLDIRECT,  in the last 72 hours Thyroid Function Tests: No results found for this basename: TSH, T4TOTAL, FREET4, T3FREE, THYROIDAB,  in the last 72 hours Anemia Panel: No results found for this basename: VITAMINB12, FOLATE, FERRITIN, TIBC, IRON, RETICCTPCT,  in the last 72 hours Coagulation:  Recent Labs  12/03/12 0608  LABPROT 15.1  INR 1.21   Urine Drug  Screen: Drugs of Abuse  No results found for this basename: labopia, cocainscrnur, labbenz, amphetmu, thcu, labbarb    Alcohol Level: No results found for this basename: ETH,  in the last 72 hours Urinalysis: No results found for this basename: COLORURINE, APPERANCEUR, LABSPEC, PHURINE, GLUCOSEU, HGBUR, BILIRUBINUR, KETONESUR, PROTEINUR, UROBILINOGEN, NITRITE, LEUKOCYTESUR,  in the last 72 hours Misc. Labs:   ABGS: No results found for this basename: PHART, PCO2, PO2ART, TCO2, HCO3,  in the last 72 hours   MICROBIOLOGY: Recent Results (from the past 240 hour(s))  MRSA PCR SCREENING     Status: Abnormal   Collection Time    12/03/12  8:56 AM      Result Value Range Status   MRSA by PCR POSITIVE (*) NEGATIVE Final   Comment:            The GeneXpert MRSA Assay (FDA     approved for NASAL specimens     only), is one component of a     comprehensive MRSA colonization     surveillance program. It is not     intended to diagnose MRSA     infection nor to guide or     monitor treatment for     MRSA infections.     CRITICAL RESULT CALLED TO, READ BACK BY AND VERIFIED WITH:     ROGERS, A. AT 11:15AM ON 12/03/12 BY PRUITT, C.    Studies/Results: No results found.  Medications:  Prior to Admission:  Prescriptions prior to admission  Medication Sig Dispense Refill  . aspirin EC 325 MG tablet Take 325 mg by mouth daily.        . butalbital-acetaminophen-caffeine (FIORICET, ESGIC) 50-325-40 MG per tablet Take 1 tablet by mouth every 6 (six) hours as needed for headache.      . Cholecalciferol (VITAMIN D) 2000 UNITS CAPS Take 1 capsule by mouth daily.      . cyanocobalamin (,VITAMIN B-12,) 1000 MCG/ML injection Inject 1,000 mcg into the muscle every 30 (thirty) days.      Marland Kitchen diltiazem (CARDIZEM CD) 300 MG 24 hr capsule Take 300 mg by mouth daily.        Marland Kitchen docusate sodium (COLACE) 100 MG capsule Take 100 mg by mouth daily.        . DULoxetine (CYMBALTA) 60 MG capsule Take 60 mg by  mouth daily.        . fluticasone (FLONASE) 50 MCG/ACT nasal spray Place 2 sprays into both nostrils daily.      Marland Kitchen  gabapentin (NEURONTIN) 300 MG capsule Take 300-600 mg by mouth 4 (four) times daily. Take 1 (300mg ) capsule at 0800, 1(300mg ) capsule at 1200, 1 (300mg ) capsule at 1600, and 2 (600mg ) capsules at bedtime      . guaiFENesin (MUCINEX) 600 MG 12 hr tablet Take 600 mg by mouth 2 (two) times daily as needed for congestion.      Marland Kitchen ibuprofen (ADVIL,MOTRIN) 200 MG tablet Take 200 mg by mouth every 8 (eight) hours as needed for pain or fever.      . Lactobacillus (ACIDOPHILUS) TABS Take 1 tablet by mouth daily.      Marland Kitchen lamoTRIgine (LAMICTAL) 25 MG tablet Take 25 mg by mouth 2 (two) times daily.        . Linaclotide (LINZESS) 290 MCG CAPS Take 1 capsule by mouth daily. 30 minutes before breakfast.  30 capsule  3  . lisinopril (PRINIVIL,ZESTRIL) 40 MG tablet Take 40 mg by mouth daily.        Marland Kitchen loratadine (CLARITIN) 10 MG tablet Take 10 mg by mouth daily.      Marland Kitchen lovastatin (MEVACOR) 20 MG tablet Take 20 mg by mouth at bedtime.      Marland Kitchen omeprazole (PRILOSEC) 20 MG capsule Take 20 mg by mouth daily.        . phenytoin (DILANTIN) 100 MG ER capsule Take 200-300 mg by mouth 2 (two) times daily. Take 2 capsules (200mg ) by mouth every morning and 3 capsules (300mg ) by mouth every evening.      . polycarbophil (FIBER-LAX) 625 MG tablet Take 625 mg by mouth daily as needed (constipation).      . prochlorperazine (COMPAZINE) 10 MG tablet Take 10 mg by mouth every 6 (six) hours as needed (nausea).      . senna (SENOKOT) 8.6 MG TABS Take 2 tablets by mouth daily.      . sertraline (ZOLOFT) 100 MG tablet Take 100 mg by mouth every morning.       . tolterodine (DETROL) 2 MG tablet Take 2 mg by mouth 2 (two) times daily.      Marland Kitchen HYDROcodone-acetaminophen (NORCO/VICODIN) 5-325 MG per tablet Take 1 tablet by mouth every 6 (six) hours as needed for pain.  40 tablet  0   Scheduled: . antiseptic oral rinse  15 mL  Mouth Rinse q12n4p  . cefTRIAXone (ROCEPHIN)  IV  1 g Intravenous Q24H  . chlorhexidine  15 mL Mouth Rinse BID  . Chlorhexidine Gluconate Cloth  6 each Topical Q0600  . docusate sodium  100 mg Oral Daily  . DULoxetine  60 mg Oral Daily  . enoxaparin (LOVENOX) injection  120 mg Subcutaneous Q12H  . lamoTRIgine  25 mg Oral BID  . lisinopril  40 mg Oral Daily  . mupirocin ointment  1 application Nasal BID  . pantoprazole  40 mg Oral Daily  . phenytoin  200 mg Oral Daily  . phenytoin  300 mg Oral QHS  . senna  2 tablet Oral Daily  . sertraline  100 mg Oral Daily  . sodium chloride  3 mL Intravenous Q12H   Continuous: . sodium chloride 20 mL/hr at 12/05/12 0800  . diltiazem (CARDIZEM) infusion 10 mg/hr (12/05/12 0800)   ZOX:WRUEAV chloride, acetaminophen, acetaminophen, alum & mag hydroxide-simeth, HYDROcodone-acetaminophen, morphine injection, ondansetron (ZOFRAN) IV, ondansetron, sodium chloride  Assesment: She has pulmonary embolus. This is complicated by the fact that she has pneumonia and multiple other medical problems. I think she is a candidate for either  warfarin  or xarelto. Since xarelto  is somewhat easier to manage and I think that would be a better choice Active Problems:   * No active hospital problems. *    Plan:start Xarelto    LOS: 2 days   Hanifa Antonetti L 12/05/2012, 8:28 AM

## 2012-12-05 NOTE — Consult Note (Addendum)
CARDIOLOGY CONSULT NOTE  Patient ID: Deborah Maddox MRN: [id number] DOB/AGE: [birth year] 59 y.o.  Admit date: 12/03/2012 Referring Physician: Felecia Shelling Primary PhysicianNo PCP Per Patient Primary Cardiologist: Valera Castle Reason for Consultation: Atrial flutter, with admission for PE. Known CAD  Active Problems:   Acute pulmonary embolism   Atrial flutter   Hypertension  HPI: Deborah Maddox is a 57 year old patient admitted with chest pain and shortness of breath and diagnosed with pulmonary emboli in the right upper and lower lobes and pneumonia.  She was found to have atrial flutter with variable heart block, and tachycardia.   She has a past medical history of hypertension, anxiety, and a CVA (age 57 with mild residual left-sided weakness).  The patient says she has had chronic intermittent CP since about 8 years ago, when she was taken off of Mevacor.  She was seen by T Wall in Summer 2013.  Not felt to be cardiac  Echo at that time showed normal LV systolic function. She also has a history of palpitations.  SHe describes some as intermittent skips  Sometimes she feels her heart race for a little while  Denies SOB No dizzines. She says for the past month she has had R sided pain that has been more constant and getting worse.  She denies a pleuritic component  Finally came in to get evaluated.  Found to have a PE as noted above.   Review of past medical records of Dr. Daleen Squibb, demonstrates she has had ongoing complaints of palpitations but no diagnosis of atrial fibrillation flutter in the past. Most recent EKG prior to this admission October of 2013 revealing normal sinus rhythm.  Review of systems complete and found to be negative unless listed above   Past Medical History  Diagnosis Date  . Coronary artery disease   . Hypertension   . Arthritis   . Sleep apnea   . Cancer     cervical  . Stroke     at age 57, left sided weakness  . PONV (postoperative nausea and vomiting)   . Anxiety   .  Seizures     last one 10 years ago  . Foot drop     left foot  . Paralysis     left arm    Family History  Problem Relation Age of Onset  . Diabetes Mother   . Diabetes Sister   . Cancer Brother     "all over"   . Cancer Other   . Diabetes Other   . Colon cancer      unsure    History   Social History  . Marital Status: Single    Spouse Name: N/A    Number of Children: N/A  . Years of Education: N/A   Occupational History  . Not on file.   Social History Main Topics  . Smoking status: Former Smoker -- 5 years    Quit date: 03/25/1981  . Smokeless tobacco: Never Used  . Alcohol Use: No  . Drug Use: No  . Sexually Active: No   Other Topics Concern  . Not on file   Social History Narrative  . No narrative on file    Past Surgical History  Procedure Laterality Date  . Leg surgery    . Cervical cone biopsy    . Breast surgery    . Tonsillectomy    . Leg surgery      left  . Colonoscopy  2006    Dr. Gabriel Cirri: normal colonoscopy   .  Open reduction internal fixation (orif) hand      right hand and arm  . Dilation and curettage of uterus    . Colonoscopy with propofol N/A 10/06/2012    Procedure: COLONOSCOPY WITH PROPOFOL;  Surgeon: Corbin Ade, MD;  Location: [facility];  Service: Endoscopy;  Laterality: N/A;  entered cecum @ 0905; total cecal withdrawal time=   . Polypectomy N/A 10/06/2012    Procedure: POLYPECTOMY;  Surgeon: Corbin Ade, MD;  Location: [facility];  Service: Endoscopy;  Laterality: N/A;  hepatic flexure polyp     Prescriptions prior to admission  Medication Sig Dispense Refill  . aspirin EC 325 MG tablet Take 325 mg by mouth daily.        . butalbital-acetaminophen-caffeine (FIORICET, ESGIC) 50-325-40 MG per tablet Take 1 tablet by mouth every 6 (six) hours as needed for headache.      . Cholecalciferol (VITAMIN D) 2000 UNITS CAPS Take 1 capsule by mouth daily.      . cyanocobalamin (,VITAMIN B-12,) 1000 MCG/ML injection Inject 1,000  mcg into the muscle every 30 (thirty) days.      Marland Kitchen diltiazem (CARDIZEM CD) 300 MG 24 hr capsule Take 300 mg by mouth daily.        Marland Kitchen docusate sodium (COLACE) 100 MG capsule Take 100 mg by mouth daily.        . DULoxetine (CYMBALTA) 60 MG capsule Take 60 mg by mouth daily.        . fluticasone (FLONASE) 50 MCG/ACT nasal spray Place 2 sprays into both nostrils daily.      Marland Kitchen gabapentin (NEURONTIN) 300 MG capsule Take 300-600 mg by mouth 4 (four) times daily. Take 1 (300mg ) capsule at 0800, 1(300mg ) capsule at 1200, 1 (300mg ) capsule at 1600, and 2 (600mg ) capsules at bedtime      . guaiFENesin (MUCINEX) 600 MG 12 hr tablet Take 600 mg by mouth 2 (two) times daily as needed for congestion.      Marland Kitchen ibuprofen (ADVIL,MOTRIN) 200 MG tablet Take 200 mg by mouth every 8 (eight) hours as needed for pain or fever.      . Lactobacillus (ACIDOPHILUS) TABS Take 1 tablet by mouth daily.      Marland Kitchen lamoTRIgine (LAMICTAL) 25 MG tablet Take 25 mg by mouth 2 (two) times daily.        . Linaclotide (LINZESS) 290 MCG CAPS Take 1 capsule by mouth daily. 30 minutes before breakfast.  30 capsule  3  . lisinopril (PRINIVIL,ZESTRIL) 40 MG tablet Take 40 mg by mouth daily.        Marland Kitchen loratadine (CLARITIN) 10 MG tablet Take 10 mg by mouth daily.      Marland Kitchen lovastatin (MEVACOR) 20 MG tablet Take 20 mg by mouth at bedtime.      Marland Kitchen omeprazole (PRILOSEC) 20 MG capsule Take 20 mg by mouth daily.        . phenytoin (DILANTIN) 100 MG ER capsule Take 200-300 mg by mouth 2 (two) times daily. Take 2 capsules (200mg ) by mouth every morning and 3 capsules (300mg ) by mouth every evening.      . polycarbophil (FIBER-LAX) 625 MG tablet Take 625 mg by mouth daily as needed (constipation).      . prochlorperazine (COMPAZINE) 10 MG tablet Take 10 mg by mouth every 6 (six) hours as needed (nausea).      . senna (SENOKOT) 8.6 MG TABS Take 2 tablets by mouth daily.      . sertraline (ZOLOFT)  100 MG tablet Take 100 mg by mouth every morning.       .  tolterodine (DETROL) 2 MG tablet Take 2 mg by mouth 2 (two) times daily.      Marland Kitchen HYDROcodone-acetaminophen (NORCO/VICODIN) 5-325 MG per tablet Take 1 tablet by mouth every 6 (six) hours as needed for pain.  40 tablet  0    Physical Exam: Blood pressure 114/77, pulse 77, temperature 98 F (36.7 C), temperature source Oral, resp. rate 16, height 5\' 5"  (1.651 m), weight 279 lb 8.7 oz (126.8 kg), SpO2 99.00%.   General: Well an obese 57 yo in NAD Head: Eyes PERRLA, No xanthomas.   Normal cephalic and atramatic  Lungs: Clear bilaterally No wheezes.  NO rales. Heart: HRRR S1 S2, without MRG.  Pulses are 2+ & equal.            No carotid bruit. No JVD.  Abdomen: Bowel sounds are positive, abdomen soft and non-tender without masses.  Obese. Msk:  Back normal, normal gait. Normal strength and tone for age. Extremities: No clubbing, cyanosis or edema.  DP +1 Neuro: Alert and oriented X 3.  CN II-XII intact.  LUE weak  (deferred full exam)     Labs:   Lab Results  Component Value Date   WBC 7.4 12/05/2012   HGB 11.0* 12/05/2012   HCT 32.6* 12/05/2012   MCV 97.6 12/05/2012   PLT 447* 12/05/2012     Recent Labs Lab 12/03/12 0608 12/05/12 0517  NA 133* 139  K 3.8 3.8  CL 97 100  CO2 25 30  BUN 9 11  CREATININE 0.51 0.48*  CALCIUM 8.6 8.4  PROT 7.4  --   BILITOT 0.4  --   ALKPHOS 195*  --   ALT 31  --   AST 39*  --   GLUCOSE 146* 119*   Lab Results  Component Value Date   TROPONINI <0.30 12/03/2012    Echocardiogram 02/03/2012 Left ventricle: The cavity size was normal. Wall thickness was increased in a pattern of mild LVH. Systolic function was normal. The estimated ejection fraction was in the range of 60% to 65%. Wall motion was normal; there were no regional wall motion abnormalities.Left atrium normal size.   Radiology:12/03/2012 PORTABLE CHEST - 1 VIEW Findings: Shallow inspiration. Cardiac enlargement. Normal pulmonary vascularity. There is interval development of  increased density in the right lung base which could be due to atelectasis or infiltration. No blunting of costophrenic angles. No pneumothorax. Tortuous aorta. IMPRESSION: Shallow inspiration with developing infiltration or atelectasis in the right lung base. Stable cardiac enlargement.  CT of the Chest 12/03/2012 IMPRESSION: Positive study for pulmonary emboli in the right upper lung and right lower lung. Atelectasis or infiltration in these areas consistent with infarct. Small right pleural effusion. Atelectasis or infiltration is also present in the left lung base without definite infarct.  ZOX:WRUEAV Flutter with variable block HR of 104.  Tele:  Currently SR 80s.  ASSESSMENT AND PLAN:   Patient is a 57 yo with a history of CP and palpitations.  Presents with 1 month R sided pain  Found to have pulmonary embolic.  Also found to be in atrial fibrillatoin  Now back in SR>  This had not been documented in past  I am not convinced brought on by PE  May be in/out at home even before.  Atrial fibrillation Agree with current Rx.  She is on anticoag for PE  Optimally should stay on this for afib,  esp given history of CVA (patient told she had a hole in heart though not picked up by echo)   Concerning is patient weakness and fall history   She may not be good candidate for long term anticoagulation  Switch dilitazem to po formulation.  Would get echo. Lovenox to coumadin as doing for PE  Pulmonary emboli Anticoagulate   ? CAD PMH with reported history of CAD though CT does not report any calcifications.  History of CP but I am not convinced cardiac.  Recent R sided CP probably related to pulm process.  Will follow WOuld check fasting lipids.   Signed: Bettey Mare. Lyman Bishop NP [facility] 12/05/2012, 9:05 AM  I have seen and examined patient  I have amended consult note with my findings  Dietrich Pates

## 2012-12-05 NOTE — Progress Notes (Signed)
ANTICOAGULATION CONSULT NOTE - Initial Consult  Pharmacy Consult for Lovenox -> coumadin Indication: pulmonary embolus  Allergies  Allergen Reactions  . Latex Rash  . Other Rash    HEART  MONITOR TAPE: severe rash, bleeding   Patient Measurements: Height: 5\' 5"  (165.1 cm) Weight: 279 lb 8.7 oz (126.8 kg) IBW/kg (Calculated) : 57  Vital Signs: Temp: 98 F (36.7 C) (04/28 0738) BP: 119/60 mmHg (04/28 1100) Pulse Rate: 88 (04/28 0900)  Labs:  Recent Labs  12/03/12 0535 12/03/12 1308 12/03/12 0623 12/04/12 0610 12/05/12 0517 12/05/12 1031  HGB 11.1*  --   --  11.1* 11.0*  --   HCT 33.1*  --   --  33.1* 32.6*  --   PLT 475*  --   --  437* 447*  --   APTT  --  30  --   --   --   --   LABPROT  --  15.1  --   --   --  14.5  INR  --  1.21  --   --   --  1.15  CREATININE  --  0.51  --   --  0.48*  --   TROPONINI  --   --  <0.30  --   --   --     Estimated Creatinine Clearance: 101.5 ml/min (by C-G formula based on Cr of 0.48).  Medical History: Past Medical History  Diagnosis Date  . Coronary artery disease   . Hypertension   . Arthritis   . Sleep apnea   . Cancer     cervical  . Stroke     at age 55, left sided weakness  . PONV (postoperative nausea and vomiting)   . Anxiety   . Seizures     last one 10 years ago  . Foot drop     left foot  . Paralysis     left arm   Medications:  Scheduled:  . antiseptic oral rinse  15 mL Mouth Rinse q12n4p  . cefTRIAXone (ROCEPHIN)  IV  1 g Intravenous Q24H  . chlorhexidine  15 mL Mouth Rinse BID  . Chlorhexidine Gluconate Cloth  6 each Topical Q0600  . diltiazem  60 mg Oral Q6H  . docusate sodium  100 mg Oral Daily  . DULoxetine  60 mg Oral Daily  . enoxaparin (LOVENOX) injection  120 mg Subcutaneous Q12H  . lamoTRIgine  25 mg Oral BID  . lisinopril  40 mg Oral Daily  . mupirocin ointment  1 application Nasal BID  . pantoprazole  40 mg Oral Daily  . [COMPLETED] patient's guide to using coumadin book   Does not  apply Once  . phenytoin  200 mg Oral Daily  . phenytoin  300 mg Oral QHS  . senna  2 tablet Oral Daily  . sertraline  100 mg Oral Daily  . sodium chloride  3 mL Intravenous Q12H  . warfarin  7.5 mg Oral Once  . [COMPLETED] warfarin   Does not apply Once  . Warfarin - Pharmacist Dosing Inpatient   Does not apply Q24H  . [DISCONTINUED] diltiazem  300 mg Oral Daily  . [DISCONTINUED] enoxaparin (LOVENOX) injection  120 mg Subcutaneous Q12H  . [DISCONTINUED] rivaroxaban  15 mg Oral BID    Assessment: 57yo female with PE.  Pt has been on full dose Lovenox and to start Coumadin today.  Day #1/5 of overlap.  Cardiology is following.  Pt has good renal fxn.  Estimated  Creatinine Clearance: 101.5 ml/min (by C-G formula based on Cr of 0.48).  Goal of Therapy:  INR 2-3 Monitor platelets by anticoagulation protocol: Yes   Plan:  Lovenox 1mg /Kg SQ q12hrs Overlap Lovenox/Coumadin x 5 days minimum or until INR >2 Coumadin 7.5mg  po today x 1 INR daily CBC 3 times weekly Coumadin education  Valrie Hart A 12/05/2012,11:33 AM

## 2012-12-05 NOTE — H&P (Signed)
Deborah Maddox, Deborah Maddox                   ACCOUNT NO.:  [id number]  MEDICAL RECORD NO.:  [id number]  LOCATION:                                 FACILITY:  PHYSICIAN:  Kingsley Callander. Ouida Sills, MD       DATE OF BIRTH:  [birth year]  DATE OF ADMISSION:  12/03/2012 DATE OF DISCHARGE:  LH                             HISTORY & PHYSICAL   CHIEF COMPLAINT:  Right chest pain.  HISTORY OF PRESENT ILLNESS:  This patient is a 57 year old white female patient of Dr. Irena Reichmann who presented to the emergency room with chest pain, shortness of breath, and cough.  The patient was initially treated with Zithromax for pneumonia after her chest x-ray revealed a right lower lobe infiltrate.  Because of her degree of pain she underwent a CT scan which revealed pulmonary emboli in the right upper lobe and right lower lobe, and an infiltrate versus atelectasis in the left lower lobe. She did have a low-grade fever.  She had a normal white count.  The patient has very limited mobility due to her prior stroke.  She denies any history of DVT or PE.  She has no history of malignancy other than cervical cancer.  She has not had a hysterectomy.  PAST MEDICAL HISTORY: 1. Stroke. 2. Hypertension. 3. Sleep apnea on CPAP. 4. Cervical cancer. 5. Depression. 6. Seizures. 7. Tonsillectomy. 8. Right wrist fracture. 9. Colon polypectomy.  MEDICATIONS: 1. Aspirin 325 mg daily. 2. Bisacodyl 10 mg q.i.d. 3. Vitamin B12 monthly. 4. Diltiazem CD 300 mg daily. 5. Colace 100 mg daily. 6. Cymbalta 60 mg daily. 7. Norco 5/325 q.6 p.r.n. 8. Acidophilus 1 tablet daily. 9. Lamotrigine 25 mg b.i.d. 10.Linzess 290 mcg daily. 11.Lisinopril 40 mg daily. 12.Prilosec 20 mg daily. 13.Dilantin 200 mg in the morning and 100 mg in the evening. 14.Senokot 2 tablets daily. 15.Zoloft 100 mg daily. 16.Detrol 2 mg b.i.d.  ALLERGIES:  LATEX.  SOCIAL HISTORY:  She has never been a smoker.  She does not drink alcohol.  She lives at  __________.  FAMILY HISTORY:  Her mother has had diabetes and a stroke.  Her sister has diabetes.  Her father had a stroke.  REVIEW OF SYSTEMS:  No vomiting, change in bowel habits, or difficulty voiding.  She had a fall this past week and appears to have suffered a small nondisplaced fracture of her left fifth toe.  PHYSICAL EXAMINATION:  VITAL SIGNS:  Temperature 98.5, pulse 104, respirations 20, blood pressure 141/67, oxygen saturation dropped to 84% at one point but is now 94% on supplemental oxygen. GENERAL:  Alert and intermittently uncomfortable appearing. HEENT.  She has mild left facial droop.  Eyes and oropharynx are unremarkable. NECK:  No JVD or thyromegaly. LUNGS:  Clear. HEART:  Regular with no murmurs.  She is mildly tachycardic. ABDOMEN:  Soft and nontender with no palpable organomegaly. EXTREMITIES:  She has bruising of the left fifth toe.  No cyanosis, clubbing, or edema. NEURO:  She has a left hemiparesis. LYMPH NODES:  No cervical or supraclavicular enlargement.  LABORATORY DATA:  Sodium 133, potassium 3.8, bicarb 25, BUN 9, creatinine 0.51, calcium 8.6, glucose 146,  alkaline phosphatase 195, albumin 2.8, AST 39, ALT 31.  Troponin I less than 0.30.  White count 9.9, hemoglobin 11.1, platelets 475,000.  INR 1.21, PTT 30, PT 15.1. Dilantin level 13.1.  IMPRESSION/PLAN: 1. Pulmonary emboli.  She has had chest pain, shortness of breath, and     cough.  Her CT reveals pulmonary emboli in the right upper lobe and     right lower lobe.  She will be treated with Lovenox.  Pharmacy     consultation will be obtained.  We will leave the decision     regarding further treatment with Xarelto, Coumadin or other options     for now to her primary physician. 2. Possible pneumonia.  She has a left lower lobe infiltrate versus     atelectasis on chest x-ray and CT.  Treat with IV Rocephin. 3. History of stroke. 4. History of seizure disorder.  Dilantin level is  therapeutic. 5. Sleep apnea.  Continue CPAP. 6. History of cervical carcinoma. 7. Left fifth toe fracture. 8. Hypoalbuminemia. 9. Mild liver function study abnormalities. 10.Slight hyponatremia.     Kingsley Callander. Ouida Sills, MD     ROF/MEDQ  D:  12/03/2012  T:  12/04/2012  Job:  [id number]

## 2012-12-05 NOTE — H&P (Signed)
NAMEJENEEN, DOUTT                   ACCOUNT NO.:  [id number]  MEDICAL RECORD NO.:  [id number]  LOCATION:  IC08                          FACILITY:  [facility]  PHYSICIAN:  Kingsley Callander. Ouida Sills, MD       DATE OF BIRTH:  Dec 05, 1955  DATE OF ADMISSION:  12/03/2012 DATE OF DISCHARGE:  LH                             HISTORY & PHYSICAL   CHIEF COMPLAINT:  Right chest pain.  HISTORY OF PRESENT ILLNESS:  This patient is a 57 year old, white female, patient of Dr. Felecia Shelling who presented to the emergency room  early Saturday morning, complaining of discomfort in her right chest with cough and shortness of breath.  She was initially felt to have pneumonia and was treated with azithromycin.  Chest x-ray revealed a shallow inspiration with infiltrate or atelectasis in the right lung base. Because of her  degree of discomfort, she underwent a CT scan of the chest, which revealed pulmonary emboli in the right upper lobe and right lower lobe.  She also had atelectasis or infiltrate in the left lower lobe.  She had a low-grade fever.  Her white count was normal.  The patient has very limited mobility.  She has had a previous stroke and has limited use of her left side.   INCOMPLETE DICTATION.     Kingsley Callander. Ouida Sills, MD     ROF/MEDQ  D:  12/04/2012  T:  12/05/2012  Job:  [id number]

## 2012-12-06 DIAGNOSIS — I10 Essential (primary) hypertension: Secondary | ICD-10-CM

## 2012-12-06 LAB — LIPID PANEL
Cholesterol: 198 mg/dL (ref 0–200)
Triglycerides: 128 mg/dL (ref <150)
VLDL: 26 mg/dL (ref 0–40)

## 2012-12-06 LAB — PROTIME-INR
INR: 1.28 (ref 0.00–1.49)
Prothrombin Time: 15.7 seconds — ABNORMAL HIGH (ref 11.6–15.2)

## 2012-12-06 MED ORDER — WARFARIN SODIUM 7.5 MG PO TABS
7.5000 mg | ORAL_TABLET | Freq: Once | ORAL | Status: AC
  Administered 2012-12-06: 7.5 mg via ORAL
  Filled 2012-12-06: qty 1

## 2012-12-06 NOTE — Progress Notes (Signed)
SUBJECTIVE:I feel yucky.  No complaints of pain. Breathing status has improved.   LABS: Basic Metabolic Panel:  Recent Labs  40/98/11 0517  NA 139  K 3.8  CL 100  CO2 30  GLUCOSE 119*  BUN 11  CREATININE 0.48*  CALCIUM 8.4    Recent Labs  12/04/12 0610 12/05/12 0517  WBC 8.9 7.4  HGB 11.1* 11.0*  HCT 33.1* 32.6*  MCV 97.4 97.6  PLT 437* 447*   Fasting Lipid Panel:  Recent Labs  12/06/12 0453  CHOL 198  HDL 36*  LDLCALC 136*  TRIG 128  CHOLHDL 5.5   PHYSICAL EXAM BP 157/74  Pulse 78  Temp(Src) 98.3 F (36.8 C) (Oral)  Resp 20  Ht 5\' 5"  (1.651 m)  Wt 279 lb 5.2 oz (126.7 kg)  BMI 46.48 kg/m2  SpO2 100% General: Well developed, well nourished, in no acute distress Head: Eyes PERRLA, No xanthomas.   Normal cephalic and atramatic  Lungs: Clear bilaterally to auscultation and percussion. Heart: HRIR S1 S2, slightly tachycardic 1/6 systolic murmur. .  Pulses are 2+ & equal.            No carotid bruit. No JVD.  No abdominal bruits. No femoral bruits. Abdomen: Bowel sounds are positive, abdomen soft and non-tender without masses or                  Hernia's noted. Msk:  Back normal, normal gait. Normal strength and tone for age. Extremities: No clubbing, cyanosis or edema.  DP +1 Neuro: Alert and oriented X 3. Psych:  Good affect, responds appropriately  TELEMETRY: Reviewed: Atrial fib rates in the 90's;  following transfer to the floor, sinus rhythm was present with rates less than 100 bpm and first degree AV block. Frequent monomorphic PVCs with few PVC pairs.   ASSESSMENT AND PLAN:  1. Atrial fib: Heart rate is moderately controlled. She is now on coumadin per pharmacy. Cardizem 60 mg Q 6 hrs. BP is stable but slightly elevated. Can potentially increase cardizem, as BP can tolerate for better HR control. Will continue to monitor.  2. PE: CT of chest positive for pulmonary emboli in the right upper lung and right lower lung. Continues on lovenox bridge  to coumadin.  3. History of CVA: Left  sided weakness prominent with limited mobiltiy  4. Hypertension: Currently moderately controlled. She is now on CCB and ACE inhibitor.   Bettey Mare. Lyman Bishop NP [facility] 12/06/2012, 8:44 AM  Cardiology Attending Patient interviewed and examined. Discussed with Joni Reining, NP.  Above note annotated and modified based upon my findings.  Patient has converted to sinus rhythm, and hopefully atrial arrhythmias will not recur as she recovers from pulmonary emboli. In the absence of atrial fibrillation, there is no need to add additional AV nodal blocking agents or increase the dose of those she is now taking.   More than likely, she will require anticoagulation of indefinite duration to prevent recurrent pulmonary embolism, which will also serve to prevent cardiac thromboembolism should atrial arrhythmias persist or recur. Echocardiogram is unremarkable except for the presence of moderate LVH.  TSH was normal when last assessed 10 months ago.  Stratton Bing, MD 12/06/2012, 6:24 PM

## 2012-12-06 NOTE — Progress Notes (Signed)
Subjective: She says she feels better. She is coughing up some sputum. She has not coughed up any blood. She still has some back pain. She is mildly short of breath  Objective: Vital signs in last 24 hours: Temp:  [97.5 F (36.4 C)-98.7 F (37.1 C)] 98.3 F (36.8 C) (04/29 0400) Pulse Rate:  [77-88] 78 (04/28 1200) Resp:  [12-19] 15 (04/29 0600) BP: (99-159)/(51-113) 140/74 mmHg (04/29 0600) SpO2:  [97 %-100 %] 100 % (04/28 1955) Weight:  [126.7 kg (279 lb 5.2 oz)] 126.7 kg (279 lb 5.2 oz) (04/29 0400) Weight change: -0.1 kg (-3.5 oz) Last BM Date: 12/02/12  Intake/Output from previous day: 04/28 0701 - 04/29 0700 In: 1343 [P.O.:840; I.V.:503] Out: 950 [Urine:950]  PHYSICAL EXAM General appearance: alert, cooperative and no distress Resp: clear to auscultation bilaterally Cardio: regular rate and rhythm, S1, S2 normal, no murmur, click, rub or gallop GI: soft, non-tender; bowel sounds normal; no masses,  no organomegaly Extremities: extremities normal, atraumatic, no cyanosis or edema  Lab Results:    Basic Metabolic Panel:  Recent Labs  16/10/96 0517  NA 139  K 3.8  CL 100  CO2 30  GLUCOSE 119*  BUN 11  CREATININE 0.48*  CALCIUM 8.4   Liver Function Tests: No results found for this basename: AST, ALT, ALKPHOS, BILITOT, PROT, ALBUMIN,  in the last 72 hours No results found for this basename: LIPASE, AMYLASE,  in the last 72 hours No results found for this basename: AMMONIA,  in the last 72 hours CBC:  Recent Labs  12/04/12 0610 12/05/12 0517  WBC 8.9 7.4  HGB 11.1* 11.0*  HCT 33.1* 32.6*  MCV 97.4 97.6  PLT 437* 447*   Cardiac Enzymes: No results found for this basename: CKTOTAL, CKMB, CKMBINDEX, TROPONINI,  in the last 72 hours BNP: No results found for this basename: PROBNP,  in the last 72 hours D-Dimer: No results found for this basename: DDIMER,  in the last 72 hours CBG: No results found for this basename: GLUCAP,  in the last 72  hours Hemoglobin A1C: No results found for this basename: HGBA1C,  in the last 72 hours Fasting Lipid Panel:  Recent Labs  12/06/12 0453  CHOL 198  HDL 36*  LDLCALC 136*  TRIG 128  CHOLHDL 5.5   Thyroid Function Tests: No results found for this basename: TSH, T4TOTAL, FREET4, T3FREE, THYROIDAB,  in the last 72 hours Anemia Panel: No results found for this basename: VITAMINB12, FOLATE, FERRITIN, TIBC, IRON, RETICCTPCT,  in the last 72 hours Coagulation:  Recent Labs  12/05/12 1031 12/06/12 0453  LABPROT 14.5 15.7*  INR 1.15 1.28   Urine Drug Screen: Drugs of Abuse  No results found for this basename: labopia, cocainscrnur, labbenz, amphetmu, thcu, labbarb    Alcohol Level: No results found for this basename: ETH,  in the last 72 hours Urinalysis: No results found for this basename: COLORURINE, APPERANCEUR, LABSPEC, PHURINE, GLUCOSEU, HGBUR, BILIRUBINUR, KETONESUR, PROTEINUR, UROBILINOGEN, NITRITE, LEUKOCYTESUR,  in the last 72 hours Misc. Labs:  ABGS No results found for this basename: PHART, PCO2, PO2ART, TCO2, HCO3,  in the last 72 hours CULTURES Recent Results (from the past 240 hour(s))  MRSA PCR SCREENING     Status: Abnormal   Collection Time    12/03/12  8:56 AM      Result Value Range Status   MRSA by PCR POSITIVE (*) NEGATIVE Final   Comment:            The GeneXpert  MRSA Assay (FDA     approved for NASAL specimens     only), is one component of a     comprehensive MRSA colonization     surveillance program. It is not     intended to diagnose MRSA     infection nor to guide or     monitor treatment for     MRSA infections.     CRITICAL RESULT CALLED TO, READ BACK BY AND VERIFIED WITH:     ROGERS, A. AT 11:15AM ON 12/03/12 BY PRUITT, C.   Studies/Results: No results found.  Medications:  Prior to Admission:  Prescriptions prior to admission  Medication Sig Dispense Refill  . aspirin EC 325 MG tablet Take 325 mg by mouth daily.        .  butalbital-acetaminophen-caffeine (FIORICET, ESGIC) 50-325-40 MG per tablet Take 1 tablet by mouth every 6 (six) hours as needed for headache.      . Cholecalciferol (VITAMIN D) 2000 UNITS CAPS Take 1 capsule by mouth daily.      . cyanocobalamin (,VITAMIN B-12,) 1000 MCG/ML injection Inject 1,000 mcg into the muscle every 30 (thirty) days.      Marland Kitchen diltiazem (CARDIZEM CD) 300 MG 24 hr capsule Take 300 mg by mouth daily.        Marland Kitchen docusate sodium (COLACE) 100 MG capsule Take 100 mg by mouth daily.        . DULoxetine (CYMBALTA) 60 MG capsule Take 60 mg by mouth daily.        . fluticasone (FLONASE) 50 MCG/ACT nasal spray Place 2 sprays into both nostrils daily.      Marland Kitchen gabapentin (NEURONTIN) 300 MG capsule Take 300-600 mg by mouth 4 (four) times daily. Take 1 (300mg ) capsule at 0800, 1(300mg ) capsule at 1200, 1 (300mg ) capsule at 1600, and 2 (600mg ) capsules at bedtime      . guaiFENesin (MUCINEX) 600 MG 12 hr tablet Take 600 mg by mouth 2 (two) times daily as needed for congestion.      Marland Kitchen ibuprofen (ADVIL,MOTRIN) 200 MG tablet Take 200 mg by mouth every 8 (eight) hours as needed for pain or fever.      . Lactobacillus (ACIDOPHILUS) TABS Take 1 tablet by mouth daily.      Marland Kitchen lamoTRIgine (LAMICTAL) 25 MG tablet Take 25 mg by mouth 2 (two) times daily.        . Linaclotide (LINZESS) 290 MCG CAPS Take 1 capsule by mouth daily. 30 minutes before breakfast.  30 capsule  3  . lisinopril (PRINIVIL,ZESTRIL) 40 MG tablet Take 40 mg by mouth daily.        Marland Kitchen loratadine (CLARITIN) 10 MG tablet Take 10 mg by mouth daily.      Marland Kitchen lovastatin (MEVACOR) 20 MG tablet Take 20 mg by mouth at bedtime.      Marland Kitchen omeprazole (PRILOSEC) 20 MG capsule Take 20 mg by mouth daily.        . phenytoin (DILANTIN) 100 MG ER capsule Take 200-300 mg by mouth 2 (two) times daily. Take 2 capsules (200mg ) by mouth every morning and 3 capsules (300mg ) by mouth every evening.      . polycarbophil (FIBER-LAX) 625 MG tablet Take 625 mg by mouth  daily as needed (constipation).      . prochlorperazine (COMPAZINE) 10 MG tablet Take 10 mg by mouth every 6 (six) hours as needed (nausea).      . senna (SENOKOT) 8.6 MG TABS Take 2 tablets by mouth  daily.      . sertraline (ZOLOFT) 100 MG tablet Take 100 mg by mouth every morning.       . tolterodine (DETROL) 2 MG tablet Take 2 mg by mouth 2 (two) times daily.      Marland Kitchen HYDROcodone-acetaminophen (NORCO/VICODIN) 5-325 MG per tablet Take 1 tablet by mouth every 6 (six) hours as needed for pain.  40 tablet  0   Scheduled: . antiseptic oral rinse  15 mL Mouth Rinse q12n4p  . cefTRIAXone (ROCEPHIN)  IV  1 g Intravenous Q24H  . chlorhexidine  15 mL Mouth Rinse BID  . Chlorhexidine Gluconate Cloth  6 each Topical Q0600  . diltiazem  60 mg Oral Q6H  . docusate sodium  100 mg Oral Daily  . DULoxetine  60 mg Oral Daily  . enoxaparin (LOVENOX) injection  120 mg Subcutaneous Q12H  . lamoTRIgine  25 mg Oral BID  . lisinopril  40 mg Oral Daily  . mupirocin ointment  1 application Nasal BID  . pantoprazole  40 mg Oral Daily  . phenytoin  200 mg Oral Daily  . phenytoin  300 mg Oral QHS  . senna  2 tablet Oral Daily  . sertraline  100 mg Oral Daily  . sodium chloride  3 mL Intravenous Q12H  . Warfarin - Pharmacist Dosing Inpatient   Does not apply Q24H   Continuous: . sodium chloride 20 mL/hr at 12/06/12 0600   JWJ:XBJYNW chloride, acetaminophen, acetaminophen, alum & mag hydroxide-simeth, HYDROcodone-acetaminophen, morphine injection, ondansetron (ZOFRAN) IV, ondansetron, sodium chloride  Assesment: She has acute pulmonary embolism. She also has what appears to be some pneumonia as well. She has a previous history of stroke and seizure disorder related to that but has not had seizures in some time. Active Problems:   Hypertension   Acute pulmonary embolism   Atrial flutter    Plan: I had initially placed her on xarelto but there is a drug interaction with Dilantin. She will therefore be  managed with Coumadin. She will need a five-day "bridge" with Lovenox and Coumadin. Continue treatment for pneumonia    LOS: 3 days   Deborah Maddox L 12/06/2012, 7:43 AM

## 2012-12-06 NOTE — Progress Notes (Signed)
Patient transferred to room 216. Report given to Arcola Jansky RN. Vital signs stable at transfer.

## 2012-12-06 NOTE — Progress Notes (Signed)
ANTICOAGULATION CONSULT NOTE  Pharmacy Consult for Lovenox -> Coumadin Indication: pulmonary embolus  Allergies  Allergen Reactions  . Latex Rash  . Other Rash    HEART  MONITOR TAPE: severe rash, bleeding   Patient Measurements: Height: 5\' 5"  (165.1 cm) Weight: 279 lb 5.2 oz (126.7 kg) IBW/kg (Calculated) : 57  Vital Signs: Temp: 97.9 F (36.6 C) (04/29 0800) Temp src: Oral (04/29 0800) BP: 119/69 mmHg (04/29 1105) Pulse Rate: 91 (04/29 1105)  Labs:  Recent Labs  12/04/12 0610 12/05/12 0517 12/05/12 1031 12/06/12 0453  HGB 11.1* 11.0*  --   --   HCT 33.1* 32.6*  --   --   PLT 437* 447*  --   --   LABPROT  --   --  14.5 15.7*  INR  --   --  1.15 1.28  CREATININE  --  0.48*  --   --     Estimated Creatinine Clearance: 101.5 ml/min (by C-G formula based on Cr of 0.48).  Medical History: Past Medical History  Diagnosis Date  . Coronary artery disease   . Hypertension   . Arthritis   . Sleep apnea   . Cancer     cervical  . Stroke     at age 33, left sided weakness  . PONV (postoperative nausea and vomiting)   . Anxiety   . Seizures     last one 10 years ago  . Foot drop     left foot  . Paralysis     left arm   Medications:  Scheduled:  . antiseptic oral rinse  15 mL Mouth Rinse q12n4p  . cefTRIAXone (ROCEPHIN)  IV  1 g Intravenous Q24H  . chlorhexidine  15 mL Mouth Rinse BID  . Chlorhexidine Gluconate Cloth  6 each Topical Q0600  . diltiazem  60 mg Oral Q6H  . docusate sodium  100 mg Oral Daily  . DULoxetine  60 mg Oral Daily  . enoxaparin (LOVENOX) injection  120 mg Subcutaneous Q12H  . lamoTRIgine  25 mg Oral BID  . lisinopril  40 mg Oral Daily  . mupirocin ointment  1 application Nasal BID  . pantoprazole  40 mg Oral Daily  . [COMPLETED] patient's guide to using coumadin book   Does not apply Once  . phenytoin  200 mg Oral Daily  . phenytoin  300 mg Oral QHS  . senna  2 tablet Oral Daily  . sertraline  100 mg Oral Daily  . sodium  chloride  3 mL Intravenous Q12H  . [COMPLETED] warfarin  7.5 mg Oral Once  . [COMPLETED] warfarin   Does not apply Once  . Warfarin - Pharmacist Dosing Inpatient   Does not apply Q24H    Assessment: 57yo female with PE.  She is on day #2/5 of overlap with Lovenox --> warfarin.  Pt has good renal fxn.  No bleeding noted.   Goal of Therapy:  INR 2-3 Monitor platelets by anticoagulation protocol: Yes   Plan:  Lovenox 1mg /Kg SQ q12hrs Overlap Lovenox/Coumadin x 5 days minimum or until INR >2 for 24 hours Coumadin 7.5mg  po today x 1 INR daily CBC 3 times weekly Coumadin education  Elson Clan 12/06/2012,11:25 AM

## 2012-12-06 NOTE — Progress Notes (Signed)
Subjective:  Patient feels better today. She is more alert and awake. No new complaint. No chest pain.  Objective: Vital signs in last 24 hours: Temp:  [97.5 F (36.4 C)-98.7 F (37.1 C)] 98.3 F (36.8 C) (04/29 0400) Pulse Rate:  [78-88] 78 (04/28 1200) Resp:  [12-19] 15 (04/29 0600) BP: (99-159)/(51-113) 140/74 mmHg (04/29 0600) SpO2:  [97 %-100 %] 100 % (04/28 1955) Weight:  [126.7 kg (279 lb 5.2 oz)] 126.7 kg (279 lb 5.2 oz) (04/29 0400) Weight change: -0.1 kg (-3.5 oz) Last BM Date: 12/02/12  Intake/Output from previous day: 04/28 0701 - 04/29 0700 In: 1343 [P.O.:840; I.V.:503] Out: 950 [Urine:950]  PHYSICAL EXAM General appearance: alert, no distress and slowed mentation Resp: diminished breath sounds bilaterally and rhonchi bilaterally Cardio: irregularly irregular rhythm GI: soft, non-tender; bowel sounds normal; no masses,  no organomegaly Extremities: extremities normal, atraumatic, no cyanosis or edema  Lab Results:    @labtest @ ABGS No results found for this basename: PHART, PCO2, PO2ART, TCO2, HCO3,  in the last 72 hours CULTURES Recent Results (from the past 240 hour(s))  MRSA PCR SCREENING     Status: Abnormal   Collection Time    12/03/12  8:56 AM      Result Value Range Status   MRSA by PCR POSITIVE (*) NEGATIVE Final   Comment:            The GeneXpert MRSA Assay (FDA     approved for NASAL specimens     only), is one component of a     comprehensive MRSA colonization     surveillance program. It is not     intended to diagnose MRSA     infection nor to guide or     monitor treatment for     MRSA infections.     CRITICAL RESULT CALLED TO, READ BACK BY AND VERIFIED WITH:     ROGERS, A. AT 11:15AM ON 12/03/12 BY PRUITT, C.   Studies/Results: No results found.  Medications: I have reviewed the patient's current medications.  Assesment: 1. Pulmonary Embolism 2. Atrial fibrillation 3.hypertension 4. H/O CAD 5. CVA with lt side  hemiplegia  Active Problems:   Hypertension   Acute pulmonary embolism   Atrial flutter    Plan: Continue anticoagulation Continue oral cardizem Pulmonary consult appreciated Cardiology consult appreciated Continue regular treatment    LOS: 3 days   Kierstin January 12/06/2012, 8:10 AM

## 2012-12-07 DIAGNOSIS — R002 Palpitations: Secondary | ICD-10-CM

## 2012-12-07 LAB — BASIC METABOLIC PANEL
BUN: 8 mg/dL (ref 6–23)
CO2: 30 mEq/L (ref 19–32)
Calcium: 9.1 mg/dL (ref 8.4–10.5)
Chloride: 100 mEq/L (ref 96–112)
Creatinine, Ser: 0.43 mg/dL — ABNORMAL LOW (ref 0.50–1.10)
GFR calc Af Amer: >90 mL/min (ref >90)
GFR calc non Af Amer: >90 mL/min (ref >90)
Glucose, Bld: 121 mg/dL — ABNORMAL HIGH (ref 70–99)
Potassium: 3.8 mEq/L (ref 3.5–5.1)
Sodium: 140 mEq/L (ref 135–145)

## 2012-12-07 LAB — CBC
HCT: 38.6 % (ref 36.0–46.0)
Hemoglobin: 13.1 g/dL (ref 12.0–15.0)
MCHC: 33.9 g/dL (ref 30.0–36.0)
MCV: 95.5 fL (ref 78.0–100.0)
RDW: 13.2 % (ref 11.5–15.5)

## 2012-12-07 LAB — PROTIME-INR
INR: 1.55 — ABNORMAL HIGH (ref 0.00–1.49)
Prothrombin Time: 18.1 seconds — ABNORMAL HIGH (ref 11.6–15.2)

## 2012-12-07 MED ORDER — DILTIAZEM HCL ER COATED BEADS 180 MG PO CP24
360.0000 mg | ORAL_CAPSULE | Freq: Every day | ORAL | Status: DC
  Administered 2012-12-08 – 2012-12-09 (×2): 360 mg via ORAL
  Filled 2012-12-07 (×2): qty 2

## 2012-12-07 MED ORDER — DILTIAZEM HCL 60 MG PO TABS
90.0000 mg | ORAL_TABLET | Freq: Four times a day (QID) | ORAL | Status: DC
  Administered 2012-12-07 (×2): 90 mg via ORAL
  Filled 2012-12-07 (×2): qty 1

## 2012-12-07 MED ORDER — WARFARIN SODIUM 7.5 MG PO TABS
7.5000 mg | ORAL_TABLET | Freq: Once | ORAL | Status: AC
  Administered 2012-12-07: 7.5 mg via ORAL
  Filled 2012-12-07: qty 1

## 2012-12-07 NOTE — Progress Notes (Signed)
Patient was NSR on monitor at beginning  Of my shift. [facility] [employer] called to tell me she has been sinus tachy greater than 145 heart rate and converted to afib then to now atrial flutter. I got a STAT EKG which confirms atrial flutter with AV block heart rate in 120's. Spoke with Dr. Trenton Founds and he stated I can give her 6 am Cardizem dose a little early and to continue to monitor her.

## 2012-12-07 NOTE — Progress Notes (Signed)
Subjective:  Patient had tachycardia during the night. Her heart rate was running around 150/min. She has no chest pain. She is being anticoagulated Objective: Vital signs in last 24 hours: Temp:  [97.1 F (36.2 C)-98.3 F (36.8 C)] 97.4 F (36.3 C) (04/30 0644) Pulse Rate:  [88-111] 110 (04/30 0713) Resp:  [15-22] 20 (04/30 0713) BP: (119-195)/(68-182) 142/68 mmHg (04/30 0644) SpO2:  [92 %-96 %] 95 % (04/30 0713) Weight change:  Last BM Date: 12/02/12  Intake/Output from previous day: 04/29 0701 - 04/30 0700 In: 480 [P.O.:240; I.V.:140; IV Piggyback:100] Out: 2950 [Urine:2950]  PHYSICAL EXAM General appearance: alert, no distress and slowed mentation Resp: diminished breath sounds bilaterally and rhonchi bilaterally Cardio: irregularly irregular rhythm GI: soft, non-tender; bowel sounds normal; no masses,  no organomegaly Extremities: extremities normal, atraumatic, no cyanosis or edema  Lab Results:    @labtest @ [facility] No results found for this basename: PHART, PCO2, PO2ART, TCO2, HCO3,  in the last 72 hours CULTURES Recent Results (from the past 240 hour(s))  MRSA PCR SCREENING     Status: Abnormal   Collection Time    12/03/12  8:56 AM      Result Value Range Status   MRSA by PCR POSITIVE (*) NEGATIVE Final   Comment:            The GeneXpert MRSA Assay (FDA     approved for NASAL specimens     only), is one component of a     comprehensive MRSA colonization     surveillance program. It is not     intended to diagnose MRSA     infection nor to guide or     monitor treatment for     MRSA infections.     CRITICAL RESULT CALLED TO, READ BACK BY AND VERIFIED WITH:     ROGERS, A. AT 11:15AM ON 12/03/12 BY PRUITT, C.   Studies/Results: No results found.  Medications: I have reviewed the patient's current medications.  Assesment: 1. Pulmonary Embolism 2. Atrial fibrillation 3.hypertension 4. H/O CAD 5. CVA with lt side hemiplegia  Active Problems:  Hypertension   Acute pulmonary embolism   Atrial flutter    Plan: Continue anticoagulation Continue oral cardizem Pulmonary consult appreciated Cardiology consult appreciated Continue regular treatment    LOS: 4 days   Kareen Jefferys 12/07/2012, 7:54 AM

## 2012-12-07 NOTE — Progress Notes (Signed)
ANTICOAGULATION CONSULT NOTE  Pharmacy Consult for Lovenox -> Coumadin Indication: pulmonary embolus  Allergies  Allergen Reactions  . Latex Rash  . Other Rash    HEART  MONITOR TAPE: severe rash, bleeding   Patient Measurements: Height: 5\' 5"  (165.1 cm) Weight: 279 lb 5.2 oz (126.7 kg) IBW/kg (Calculated) : 57  Vital Signs: Temp: 97.4 F (36.3 C) (04/30 0644) Temp src: Oral (04/30 0644) BP: 142/68 mmHg (04/30 0644) Pulse Rate: 110 (04/30 0713)  Labs:  Recent Labs  12/05/12 0517 12/05/12 1031 12/06/12 0453 12/07/12 0436  HGB 11.0*  --   --  13.1  HCT 32.6*  --   --  38.6  PLT 447*  --   --  480*  LABPROT  --  14.5 15.7* 18.1*  INR  --  1.15 1.28 1.55*  CREATININE 0.48*  --   --  0.43*    Estimated Creatinine Clearance: 101.5 ml/min (by C-G formula based on Cr of 0.43).  Medical History: Past Medical History  Diagnosis Date  . Coronary artery disease   . Hypertension   . Arthritis   . Sleep apnea   . Cancer     cervical  . Stroke     at age 25, left sided weakness  . PONV (postoperative nausea and vomiting)   . Anxiety   . Seizures     last one 10 years ago  . Foot drop     left foot  . Paralysis     left arm   Medications:  Scheduled:  . antiseptic oral rinse  15 mL Mouth Rinse q12n4p  . cefTRIAXone (ROCEPHIN)  IV  1 g Intravenous Q24H  . chlorhexidine  15 mL Mouth Rinse BID  . Chlorhexidine Gluconate Cloth  6 each Topical Q0600  . diltiazem  90 mg Oral Q6H  . docusate sodium  100 mg Oral Daily  . DULoxetine  60 mg Oral Daily  . enoxaparin (LOVENOX) injection  120 mg Subcutaneous Q12H  . lamoTRIgine  25 mg Oral BID  . lisinopril  40 mg Oral Daily  . mupirocin ointment  1 application Nasal BID  . pantoprazole  40 mg Oral Daily  . phenytoin  200 mg Oral Daily  . phenytoin  300 mg Oral QHS  . senna  2 tablet Oral Daily  . sertraline  100 mg Oral Daily  . sodium chloride  3 mL Intravenous Q12H  . [COMPLETED] warfarin  7.5 mg Oral ONCE-1800   . warfarin  7.5 mg Oral Once  . Warfarin - Pharmacist Dosing Inpatient   Does not apply Q24H  . [DISCONTINUED] diltiazem  60 mg Oral Q6H    Assessment: 57yo female with PE.  She is on day #3/5 of overlap with Lovenox --> warfarin.  Pt has good renal fxn.  No bleeding noted.  Sluggish INR response.  Goal of Therapy:  INR 2-3 Monitor platelets by anticoagulation protocol: Yes   Plan:  Lovenox 1mg /Kg SQ q12hrs Overlap Lovenox/Coumadin x 5 days minimum or until INR >2 for 24 hours Coumadin 7.5mg  po today x 1 INR daily CBC 3 times weekly Coumadin education  Markesan, Felix Pratt A 12/07/2012,10:10 AM

## 2012-12-07 NOTE — Progress Notes (Signed)
SUBJECTIVE: Feeling about the same, states she has a headache, which is chronic for her. Denies any chest pain or leg pain.  Active Problems:   Acute pulmonary embolism   Atrial flutter   Hypertension  LABS: Basic Metabolic Panel:  Recent Labs  29/56/21 0517 12/07/12 0436  NA 139 140  K 3.8 3.8  CL 100 100  CO2 30 30  GLUCOSE 119* 121*  BUN 11 8  CREATININE 0.48* 0.43*  CALCIUM 8.4 9.1   Recent Labs  12/05/12 0517 12/07/12 0436  WBC 7.4 7.7  HGB 11.0* 13.1  HCT 32.6* 38.6  MCV 97.6 95.5  PLT 447* 480*   Fasting Lipid Panel:  Recent Labs  12/06/12 0453  CHOL 198  HDL 36*  LDLCALC 136*  TRIG 128  CHOLHDL 5.5   PHYSICAL EXAM BP 142/68  Pulse 110  Temp(Src) 97.4 F (36.3 C) (Oral)  Resp 20  Ht 5\' 5"  (1.651 m)  Wt 279 lb 5.2 oz (126.7 kg)  BMI 46.48 kg/m2  SpO2 95% General: Well developed, well nourished, in no acute distress Head: Eyes PERRLA, No xanthomas.   Normal cephalic and atramatic  Lungs: Clear bilaterally to auscultation and percussion. Heart: HRRR S1 S2, No MRG .  Pulses are 2+ & equal.            No carotid bruit. No JVD.  No abdominal bruits. No femoral bruits. Abdomen: Bowel sounds are positive, abdomen soft and non-tender without masses or                  Hernia's noted. Msk:  Back normal, normal gait. Normal strength and tone for age. Extremities: No clubbing, cyanosis or edema.  DP +1 Neuro: Alert and oriented X 3.Left sided weakness Psych:  Flat affect, responds appropriately  TELEMETRY: Reviewed telemetry pt in NSR rate of 100 :  ASSESSMENT AND PLAN:  1. Atrial fib: Converted to NSR with moderate rate control.  She is now on coumadin per pharmacy. Cardizem 90 mg Q 6 hrs. BP is stable but slightly elevated.  Will continue to monitor. Consider changing to long acting CCB for ease of administration.  2. PE: CT of chest positive for pulmonary emboli in the right upper lung and right lower lung. Continues on lovenox bridge to  coumadin.   3. History of CVA: Left sided weakness prominent with limited mobiltiy   4. Hypertension: Currently moderately controlled. She is now on CCB and ACE inhibitor.   Deborah Maddox. Lyman Bishop NP [facility] 12/07/2012, 9:12 AM  Cardiology Attending Patient interviewed and examined. Discussed with Deborah Reining, NP.  Above note annotated and modified based upon my findings.  Convert to sustained release diltiazem. Current Rx is adequate from a cardiology standpoint.   Bagley Bing, MD 12/07/2012, 5:39 PM

## 2012-12-07 NOTE — Progress Notes (Signed)
Subjective: She had an episode of tachycardia. She feels better now. Her breathing is okay. She says she has a lot of congestion in her head.  Objective: Vital signs in last 24 hours: Temp:  [97.1 F (36.2 C)-98.3 F (36.8 C)] 97.4 F (36.3 C) (04/30 0644) Pulse Rate:  [88-111] 110 (04/30 0713) Resp:  [15-22] 20 (04/30 0713) BP: (119-195)/(68-182) 142/68 mmHg (04/30 0644) SpO2:  [92 %-96 %] 95 % (04/30 0713) Weight change:  Last BM Date: 12/02/12  Intake/Output from previous day: 04/29 0701 - 04/30 0700 In: 480 [P.O.:240; I.V.:140; IV Piggyback:100] Out: 2950 [Urine:2950]  PHYSICAL EXAM General appearance: alert, cooperative and no distress Resp: clear to auscultation bilaterally Cardio: irregularly irregular rhythm GI: soft, non-tender; bowel sounds normal; no masses,  no organomegaly Extremities: extremities normal, atraumatic, no cyanosis or edema  Lab Results:    Basic Metabolic Panel:  Recent Labs  16/10/96 0517 12/07/12 0436  NA 139 140  K 3.8 3.8  CL 100 100  CO2 30 30  GLUCOSE 119* 121*  BUN 11 8  CREATININE 0.48* 0.43*  CALCIUM 8.4 9.1   Liver Function Tests: No results found for this basename: AST, ALT, ALKPHOS, BILITOT, PROT, ALBUMIN,  in the last 72 hours No results found for this basename: LIPASE, AMYLASE,  in the last 72 hours No results found for this basename: AMMONIA,  in the last 72 hours CBC:  Recent Labs  12/05/12 0517 12/07/12 0436  WBC 7.4 7.7  HGB 11.0* 13.1  HCT 32.6* 38.6  MCV 97.6 95.5  PLT 447* 480*   Cardiac Enzymes: No results found for this basename: CKTOTAL, CKMB, CKMBINDEX, TROPONINI,  in the last 72 hours BNP: No results found for this basename: PROBNP,  in the last 72 hours D-Dimer: No results found for this basename: DDIMER,  in the last 72 hours CBG: No results found for this basename: GLUCAP,  in the last 72 hours Hemoglobin A1C: No results found for this basename: HGBA1C,  in the last 72 hours Fasting Lipid  Panel:  Recent Labs  12/06/12 0453  CHOL 198  HDL 36*  LDLCALC 136*  TRIG 128  CHOLHDL 5.5   Thyroid Function Tests: No results found for this basename: TSH, T4TOTAL, FREET4, T3FREE, THYROIDAB,  in the last 72 hours Anemia Panel: No results found for this basename: VITAMINB12, FOLATE, FERRITIN, TIBC, IRON, RETICCTPCT,  in the last 72 hours Coagulation:  Recent Labs  12/06/12 0453 12/07/12 0436  LABPROT 15.7* 18.1*  INR 1.28 1.55*   Urine Drug Screen: Drugs of Abuse  No results found for this basename: labopia, cocainscrnur, labbenz, amphetmu, thcu, labbarb    Alcohol Level: No results found for this basename: ETH,  in the last 72 hours Urinalysis: No results found for this basename: COLORURINE, APPERANCEUR, LABSPEC, PHURINE, GLUCOSEU, HGBUR, BILIRUBINUR, KETONESUR, PROTEINUR, UROBILINOGEN, NITRITE, LEUKOCYTESUR,  in the last 72 hours Misc. Labs:  ABGS No results found for this basename: PHART, PCO2, PO2ART, TCO2, HCO3,  in the last 72 hours CULTURES Recent Results (from the past 240 hour(s))  MRSA PCR SCREENING     Status: Abnormal   Collection Time    12/03/12  8:56 AM      Result Value Range Status   MRSA by PCR POSITIVE (*) NEGATIVE Final   Comment:            The GeneXpert MRSA Assay (FDA     approved for NASAL specimens     only), is one component of a  comprehensive MRSA colonization     surveillance program. It is not     intended to diagnose MRSA     infection nor to guide or     monitor treatment for     MRSA infections.     CRITICAL RESULT CALLED TO, READ BACK BY AND VERIFIED WITH:     ROGERS, A. AT 11:15AM ON 12/03/12 BY PRUITT, C.   Studies/Results: No results found.  Medications:  Prior to Admission:  Prescriptions prior to admission  Medication Sig Dispense Refill  . aspirin EC 325 MG tablet Take 325 mg by mouth daily.        . butalbital-acetaminophen-caffeine (FIORICET, ESGIC) 50-325-40 MG per tablet Take 1 tablet by mouth every 6  (six) hours as needed for headache.      . Cholecalciferol (VITAMIN D) 2000 UNITS CAPS Take 1 capsule by mouth daily.      . cyanocobalamin (,VITAMIN B-12,) 1000 MCG/ML injection Inject 1,000 mcg into the muscle every 30 (thirty) days.      Marland Kitchen diltiazem (CARDIZEM CD) 300 MG 24 hr capsule Take 300 mg by mouth daily.        Marland Kitchen docusate sodium (COLACE) 100 MG capsule Take 100 mg by mouth daily.        . DULoxetine (CYMBALTA) 60 MG capsule Take 60 mg by mouth daily.        . fluticasone (FLONASE) 50 MCG/ACT nasal spray Place 2 sprays into both nostrils daily.      Marland Kitchen gabapentin (NEURONTIN) 300 MG capsule Take 300-600 mg by mouth 4 (four) times daily. Take 1 (300mg ) capsule at 0800, 1(300mg ) capsule at 1200, 1 (300mg ) capsule at 1600, and 2 (600mg ) capsules at bedtime      . guaiFENesin (MUCINEX) 600 MG 12 hr tablet Take 600 mg by mouth 2 (two) times daily as needed for congestion.      Marland Kitchen ibuprofen (ADVIL,MOTRIN) 200 MG tablet Take 200 mg by mouth every 8 (eight) hours as needed for pain or fever.      . Lactobacillus (ACIDOPHILUS) TABS Take 1 tablet by mouth daily.      Marland Kitchen lamoTRIgine (LAMICTAL) 25 MG tablet Take 25 mg by mouth 2 (two) times daily.        . Linaclotide (LINZESS) 290 MCG CAPS Take 1 capsule by mouth daily. 30 minutes before breakfast.  30 capsule  3  . lisinopril (PRINIVIL,ZESTRIL) 40 MG tablet Take 40 mg by mouth daily.        Marland Kitchen loratadine (CLARITIN) 10 MG tablet Take 10 mg by mouth daily.      Marland Kitchen lovastatin (MEVACOR) 20 MG tablet Take 20 mg by mouth at bedtime.      Marland Kitchen omeprazole (PRILOSEC) 20 MG capsule Take 20 mg by mouth daily.        . phenytoin (DILANTIN) 100 MG ER capsule Take 200-300 mg by mouth 2 (two) times daily. Take 2 capsules (200mg ) by mouth every morning and 3 capsules (300mg ) by mouth every evening.      . polycarbophil (FIBER-LAX) 625 MG tablet Take 625 mg by mouth daily as needed (constipation).      . prochlorperazine (COMPAZINE) 10 MG tablet Take 10 mg by mouth every 6  (six) hours as needed (nausea).      . senna (SENOKOT) 8.6 MG TABS Take 2 tablets by mouth daily.      . sertraline (ZOLOFT) 100 MG tablet Take 100 mg by mouth every morning.       Marland Kitchen  tolterodine (DETROL) 2 MG tablet Take 2 mg by mouth 2 (two) times daily.      Marland Kitchen HYDROcodone-acetaminophen (NORCO/VICODIN) 5-325 MG per tablet Take 1 tablet by mouth every 6 (six) hours as needed for pain.  40 tablet  0   Scheduled: . antiseptic oral rinse  15 mL Mouth Rinse q12n4p  . cefTRIAXone (ROCEPHIN)  IV  1 g Intravenous Q24H  . chlorhexidine  15 mL Mouth Rinse BID  . Chlorhexidine Gluconate Cloth  6 each Topical Q0600  . diltiazem  90 mg Oral Q6H  . docusate sodium  100 mg Oral Daily  . DULoxetine  60 mg Oral Daily  . enoxaparin (LOVENOX) injection  120 mg Subcutaneous Q12H  . lamoTRIgine  25 mg Oral BID  . lisinopril  40 mg Oral Daily  . mupirocin ointment  1 application Nasal BID  . pantoprazole  40 mg Oral Daily  . phenytoin  200 mg Oral Daily  . phenytoin  300 mg Oral QHS  . senna  2 tablet Oral Daily  . sertraline  100 mg Oral Daily  . sodium chloride  3 mL Intravenous Q12H  . warfarin  7.5 mg Oral Once  . Warfarin - Pharmacist Dosing Inpatient   Does not apply Q24H   Continuous: . sodium chloride 20 mL/hr at 12/06/12 1200   NWG:NFAOZH chloride, acetaminophen, acetaminophen, alum & mag hydroxide-simeth, HYDROcodone-acetaminophen, morphine injection, ondansetron (ZOFRAN) IV, ondansetron, sodium chloride  Assesment: She has pneumonia and in addition has had an acute pulmonary embolism. She is improving. She is being anticoagulated. Her breathing is much better. Active Problems:   Hypertension   Acute pulmonary embolism   Atrial flutter    Plan: Continue with current treatments with Lovenox and warfarin. I will plan to sign off at this point.  Thanks for allowing me to see her with you    LOS: 4 days   Artia Singley L 12/07/2012, 8:17 AM

## 2012-12-07 NOTE — Clinical Social Work Note (Signed)
CSW met w patient at bedside, sister Alonza Smoker in room w patient consent.  Confirmed that patient wants to return to [facility] ALF at discharge, no concerns noted.  Updated Reggie at [facility].  Santa Genera, LCSW Clinical Social Worker [phone])

## 2012-12-08 LAB — PROTIME-INR: Prothrombin Time: 27 seconds — ABNORMAL HIGH (ref 11.6–15.2)

## 2012-12-08 MED ORDER — WARFARIN SODIUM 5 MG PO TABS
5.0000 mg | ORAL_TABLET | Freq: Once | ORAL | Status: AC
  Administered 2012-12-08: 5 mg via ORAL
  Filled 2012-12-08: qty 1

## 2012-12-08 NOTE — Progress Notes (Signed)
Subjective:  Patient feels better. Her heart rate is controlled. No chest pain. She is being anticoagulated. Objective: Vital signs in last 24 hours: Temp:  [98.2 F (36.8 C)-99.1 F (37.3 C)] 98.2 F (36.8 C) (05/01 0755) Pulse Rate:  [77-109] 77 (05/01 0756) Resp:  [20] 20 (05/01 0755) BP: (124-136)/(66-75) 134/71 mmHg (05/01 0755) SpO2:  [92 %-98 %] 98 % (05/01 0756) Weight change:  Last BM Date: 12/02/12  Intake/Output from previous day: 04/30 0701 - 05/01 0700 In: -  Out: 200 [Urine:200]  PHYSICAL EXAM General appearance: alert, no distress and slowed mentation Resp: diminished breath sounds bilaterally and rhonchi bilaterally Cardio: irregularly irregular rhythm GI: soft, non-tender; bowel sounds normal; no masses,  no organomegaly Extremities: extremities normal, atraumatic, no cyanosis or edema  Lab Results:    @labtest @ ABGS No results found for this basename: PHART, PCO2, PO2ART, TCO2, HCO3,  in the last 72 hours CULTURES Recent Results (from the past 240 hour(s))  MRSA PCR SCREENING     Status: Abnormal   Collection Time    12/03/12  8:56 AM      Result Value Range Status   MRSA by PCR POSITIVE (*) NEGATIVE Final   Comment:            The GeneXpert MRSA Assay (FDA     approved for NASAL specimens     only), is one component of a     comprehensive MRSA colonization     surveillance program. It is not     intended to diagnose MRSA     infection nor to guide or     monitor treatment for     MRSA infections.     CRITICAL RESULT CALLED TO, READ BACK BY AND VERIFIED WITH:     ROGERS, A. AT 11:15AM ON 12/03/12 BY PRUITT, C.   Studies/Results: No results found.  Medications: I have reviewed the patient's current medications.  Assesment: 1. Pulmonary Embolism 2. Atrial fibrillation 3.hypertension 4. H/O CAD 5. CVA with lt side hemiplegia  Active Problems:   Hypertension   Acute pulmonary embolism   Atrial flutter    Plan: Continue  anticoagulation Continue oral cardizem Continue regular treatment Will plan to discharge on 5/2 to the assisted living once her anticoagulation is achieved and established     LOS: 5 days   Marti Mclane 12/08/2012, 8:29 AM

## 2012-12-08 NOTE — Care Management Note (Unsigned)
    Page 1 of 1   12/08/2012     4:04:35 PM   CARE MANAGEMENT NOTE 12/08/2012  Patient:  Deborah Maddox, Deborah Maddox   Account Number:  [id number]  Date Initiated:  12/06/2012  Documentation initiated by:  Anibal Henderson  Subjective/Objective Assessment:   Admitted to [facility] with Pneumonia, A Fib, and PE. Pt is from [facility], ALF, and states she will return there at D/C     Action/Plan:   Referred to CSW- will follow for needs   Anticipated DC Date:  12/09/2012   Anticipated DC Plan:  ASSISTED LIVING / REST HOME  In-house referral  Clinical Social Worker      DC Planning Services  CM consult      Choice offered to / List presented to:             Status of service:  In process, will continue to follow Medicare Important Message given?   (If response is "NO", the following Medicare IM given date fields will be blank) Date Medicare IM given:   Date Additional Medicare IM given:    Discharge Disposition:    Per UR Regulation:    If discussed at Long Length of Stay Meetings, dates discussed:    Comments:  12/05/12 1500 Anibal Henderson RN

## 2012-12-08 NOTE — Progress Notes (Signed)
ANTICOAGULATION CONSULT NOTE  Pharmacy Consult for Lovenox -> Coumadin Indication: pulmonary embolus  Allergies  Allergen Reactions  . Latex Rash  . Other Rash    HEART  MONITOR TAPE: severe rash, bleeding   Patient Measurements: Height: 5\' 5"  (165.1 cm) Weight:  (no scale on bed, pt mot amblatory) IBW/kg (Calculated) : 57  Vital Signs: Temp: 98.2 F (36.8 C) (05/01 0755) Temp src: Axillary (05/01 0755) BP: 134/71 mmHg (05/01 0755) Pulse Rate: 77 (05/01 0756)  Labs:  Recent Labs  12/06/12 0453 12/07/12 0436 12/08/12 0436  HGB  --  13.1  --   HCT  --  38.6  --   PLT  --  480*  --   LABPROT 15.7* 18.1* 27.0*  INR 1.28 1.55* 2.65*  CREATININE  --  0.43*  --     Estimated Creatinine Clearance: 101.5 ml/min (by C-G formula based on Cr of 0.43).  Medical History: Past Medical History  Diagnosis Date  . Coronary artery disease   . Hypertension   . Arthritis   . Sleep apnea   . Cancer     cervical  . Stroke     at age 44, left sided weakness  . PONV (postoperative nausea and vomiting)   . Anxiety   . Seizures     last one 10 years ago  . Foot drop     left foot  . Paralysis     left arm   Medications:  Scheduled:  . antiseptic oral rinse  15 mL Mouth Rinse q12n4p  . cefTRIAXone (ROCEPHIN)  IV  1 g Intravenous Q24H  . chlorhexidine  15 mL Mouth Rinse BID  . [COMPLETED] Chlorhexidine Gluconate Cloth  6 each Topical Q0600  . diltiazem  360 mg Oral Daily  . docusate sodium  100 mg Oral Daily  . DULoxetine  60 mg Oral Daily  . enoxaparin (LOVENOX) injection  120 mg Subcutaneous Q12H  . lamoTRIgine  25 mg Oral BID  . lisinopril  40 mg Oral Daily  . [COMPLETED] mupirocin ointment  1 application Nasal BID  . pantoprazole  40 mg Oral Daily  . phenytoin  200 mg Oral Daily  . phenytoin  300 mg Oral QHS  . senna  2 tablet Oral Daily  . sertraline  100 mg Oral Daily  . sodium chloride  3 mL Intravenous Q12H  . [COMPLETED] warfarin  7.5 mg Oral Once  .  Warfarin - Pharmacist Dosing Inpatient   Does not apply Q24H  . [DISCONTINUED] diltiazem  90 mg Oral Q6H    Assessment: 57yo female with PE.  She is on day #4/5 of overlap with Lovenox --> warfarin.  Pt has good renal fxn.  No bleeding noted.  INR responding.  Goal of Therapy:  INR 2-3 Monitor platelets by anticoagulation protocol: Yes   Plan:  Lovenox 1mg /Kg SQ q12hrs Overlap Lovenox/Coumadin x 5 days minimum plus INR >2 for 24 hours Coumadin 5mg  po today x 1 INR daily CBC 3 times weekly Coumadin education completed  Mady Gemma 12/08/2012,8:32 AM

## 2012-12-08 NOTE — Clinical Social Work Note (Signed)
Chart reviewed, update given to [facility] ALF administrator, informed of anticipated discharge tomorrow if medically ready.  Facility continues to be willing to take patient back at discharge.  Prefers [facility] for [facility] needs.  Santa Genera, LCSW Clinical Social Worker [phone])

## 2012-12-09 LAB — BASIC METABOLIC PANEL
Chloride: 102 mEq/L (ref 96–112)
GFR calc Af Amer: >90 mL/min (ref >90)
GFR calc non Af Amer: >90 mL/min (ref >90)
Glucose, Bld: 109 mg/dL — ABNORMAL HIGH (ref 70–99)
Potassium: 3.7 mEq/L (ref 3.5–5.1)
Sodium: 140 mEq/L (ref 135–145)

## 2012-12-09 LAB — PROTIME-INR
INR: 2.94 — ABNORMAL HIGH (ref 0.00–1.49)
Prothrombin Time: 29.1 seconds — ABNORMAL HIGH (ref 11.6–15.2)

## 2012-12-09 LAB — CBC
HCT: 37.7 % (ref 36.0–46.0)
Hemoglobin: 12.7 g/dL (ref 12.0–15.0)
RBC: 3.9 MIL/uL (ref 3.87–5.11)

## 2012-12-09 MED ORDER — DILTIAZEM HCL ER COATED BEADS 360 MG PO CP24: 360.0000 mg | ORAL_CAPSULE | Freq: Every day | ORAL | Status: DC

## 2012-12-09 MED ORDER — AMOXICILLIN-POT CLAVULANATE 500-125 MG PO TABS: 1.0000 | ORAL_TABLET | Freq: Three times a day (TID) | ORAL | Status: DC

## 2012-12-09 MED ORDER — WARFARIN SODIUM 5 MG PO TABS: 5.0000 mg | ORAL_TABLET | Freq: Every day | ORAL | Status: DC

## 2012-12-09 NOTE — Plan of Care (Signed)
Problem: Discharge Progression Outcomes Goal: Discharge plan in place and appropriate Outcome: Completed/Met Date Met:  12/09/12 [place] Goal: Other Discharge Outcomes/Goals Outcome: Completed/Met Date Met:  12/09/12 Discharged to [facility]

## 2012-12-09 NOTE — Progress Notes (Signed)
Room air O2 sat is 98%

## 2012-12-09 NOTE — Discharge Summary (Signed)
Physician Discharge Summary  Patient ID: Deborah Maddox MRN: [id number] DOB/AGE: 1955/10/19 57 y.o. Primary Care Physician:No PCP Per Patient Admit date: 12/03/2012 Discharge date: 12/09/2012    Discharge Diagnoses:   1. Pulmonary Embolism  2. Atrial fibrillation  3.hypertension  4. H/O CAD  5. CVA with lt side hemiplegia  Active Problems:   Hypertension   Acute pulmonary embolism   Atrial flutter     Medication List    STOP taking these medications       aspirin EC 325 MG tablet     ibuprofen 200 MG tablet  Commonly known as:  ADVIL,MOTRIN      TAKE these medications       Acidophilus Tabs  Take 1 tablet by mouth daily.     amoxicillin-clavulanate 500-125 MG per tablet  Commonly known as:  AUGMENTIN  Take 1 tablet (500 mg total) by mouth 3 (three) times daily.     butalbital-acetaminophen-caffeine 50-325-40 MG per tablet  Commonly known as:  FIORICET, ESGIC  Take 1 tablet by mouth every 6 (six) hours as needed for headache.     cyanocobalamin 1000 MCG/ML injection  Commonly known as:  (VITAMIN B-12)  Inject 1,000 mcg into the muscle every 30 (thirty) days.     diltiazem 360 MG 24 hr capsule  Commonly known as:  CARDIZEM CD  Take 1 capsule (360 mg total) by mouth daily.     docusate sodium 100 MG capsule  Commonly known as:  COLACE  Take 100 mg by mouth daily.     DULoxetine 60 MG capsule  Commonly known as:  CYMBALTA  Take 60 mg by mouth daily.     FIBER-LAX 625 MG tablet  Generic drug:  polycarbophil  Take 625 mg by mouth daily as needed (constipation).     fluticasone 50 MCG/ACT nasal spray  Commonly known as:  FLONASE  Place 2 sprays into both nostrils daily.     gabapentin 300 MG capsule  Commonly known as:  NEURONTIN  Take 300-600 mg by mouth 4 (four) times daily. Take 1 (300mg ) capsule at 0800, 1(300mg ) capsule at 1200, 1 (300mg ) capsule at 1600, and 2 (600mg ) capsules at bedtime     guaiFENesin 600 MG 12 hr tablet  Commonly known as:   MUCINEX  Take 600 mg by mouth 2 (two) times daily as needed for congestion.     HYDROcodone-acetaminophen 5-325 MG per tablet  Commonly known as:  NORCO/VICODIN  Take 1 tablet by mouth every 6 (six) hours as needed for pain.     lamoTRIgine 25 MG tablet  Commonly known as:  LAMICTAL  Take 25 mg by mouth 2 (two) times daily.     Linaclotide 290 MCG Caps  Commonly known as:  LINZESS  Take 1 capsule by mouth daily. 30 minutes before breakfast.     lisinopril 40 MG tablet  Commonly known as:  PRINIVIL,ZESTRIL  Take 40 mg by mouth daily.     loratadine 10 MG tablet  Commonly known as:  CLARITIN  Take 10 mg by mouth daily.     lovastatin 20 MG tablet  Commonly known as:  MEVACOR  Take 20 mg by mouth at bedtime.     omeprazole 20 MG capsule  Commonly known as:  PRILOSEC  Take 20 mg by mouth daily.     phenytoin 100 MG ER capsule  Commonly known as:  DILANTIN  Take 200-300 mg by mouth 2 (two) times daily. Take 2 capsules (200mg ) by mouth  every morning and 3 capsules (300mg ) by mouth every evening.     prochlorperazine 10 MG tablet  Commonly known as:  COMPAZINE  Take 10 mg by mouth every 6 (six) hours as needed (nausea).     senna 8.6 MG Tabs  Commonly known as:  SENOKOT  Take 2 tablets by mouth daily.     sertraline 100 MG tablet  Commonly known as:  ZOLOFT  Take 100 mg by mouth every morning.     tolterodine 2 MG tablet  Commonly known as:  DETROL  Take 2 mg by mouth 2 (two) times daily.     Vitamin D 2000 UNITS Caps  Take 1 capsule by mouth daily.     warfarin 5 MG tablet  Commonly known as:  COUMADIN  Take 1 tablet (5 mg total) by mouth daily.        Discharged Condition: improved    Consults: cardiology and pulmonary  Significant Diagnostic Studies: Ct Angio Chest Pe W/cm &/or Wo Cm  12/03/2012  *RADIOLOGY REPORT*  Clinical Data: Cough, chest pain, back pain, nausea.  CT ANGIOGRAPHY CHEST  Technique:  Multidetector CT imaging of the chest using the  standard protocol during bolus administration of intravenous contrast. Multiplanar reconstructed images including MIPs were obtained and reviewed to evaluate the vascular anatomy.  Contrast: OMNIPAQUE IOHEXOL 350 MG/ML SOLN  Comparison: None.  Findings: Technically adequate study with moderately good opacification of the central and segmental pulmonary arteries. There are filling defects in the right lower lobe pulmonary arteries and in the right upper lobe pulmonary artery associated with consolidation consistent with pulmonary embolus and infarct. Small right pleural effusion.  Atelectasis or infiltration is also present in the left lung base although no specific pulmonary embolus is identified on the left.  Normal heart size.  Normal caliber thoracic aorta.  No significant lymphadenopathy in the chest.  The esophagus is decompressed.  No pneumothorax.  Airways appear patent.  Visualized portions of the upper abdominal organs demonstrate cholelithiasis with a large stone in the gallbladder.  IMPRESSION: Positive study for pulmonary emboli in the right upper lung and right lower lung.  Atelectasis or infiltration in these areas consistent with infarct.  Small right pleural effusion. Atelectasis or infiltration is also present in the left lung base without definite infarct.  Results were discussed with Dr. Colon Branch at 0554 hours on 12/03/2012.   Original Report Authenticated By: Burman Nieves, M.D.    Dg Chest Port 1 View  12/03/2012  *RADIOLOGY REPORT*  Clinical Data: Chest pain  PORTABLE CHEST - 1 VIEW  Comparison: 06/04/2012  Findings: Shallow inspiration.  Cardiac enlargement.  Normal pulmonary vascularity.  There is interval development of increased density in the right lung base which could be due to atelectasis or infiltration.  No blunting of costophrenic angles.  No pneumothorax.  Tortuous aorta.  IMPRESSION: Shallow inspiration with developing infiltration or atelectasis in the right lung base.   Stable cardiac enlargement.   Original Report Authenticated By: Burman Nieves, M.D.    Dg Knee Complete 4 Views Right  11/27/2012  *RADIOLOGY REPORT*  Clinical Data: Fall, right knee pain  RIGHT KNEE - COMPLETE 4+ VIEW  Comparison: 08/11/2012  Findings: Moderate tricompartmental degenerative changes, most prominent in the patellofemoral compartment.  No fracture or dislocation is seen.  Stable ossification superior/lateral to the patella.  No suprapatellar knee joint effusion.  IMPRESSION: No fracture or dislocation is seen.  Moderate degenerative changes.   Original Report Authenticated By: Charline Bills, M.D.  Dg Hand Complete Left  11/27/2012  *RADIOLOGY REPORT*  Clinical Data: Fall, left hand pain  LEFT HAND - COMPLETE 3+ VIEW  Comparison: None.  Findings: No fracture is seen.  Angulation of the fifth digit at the MCP joint may be related to patient positioning, although subluxation is not excluded.  Degenerative changes at the first carpometacarpal joint.  Visualized soft tissues are grossly unremarkable.  IMPRESSION: No fracture is seen.  Angulation of the fifth digit at the MCP joint, possibly positional.   Original Report Authenticated By: Charline Bills, M.D.    Dg Foot Complete Left  11/27/2012  *RADIOLOGY REPORT*  Clinical Data:  Pain, fell from wheelchair  LEFT FOOT - COMPLETE 3+ VIEW  Comparison: None.  Findings: The bones are diffusely mildly osteopenic.  A suggestion of a faint linear lucency through the cortex of the head of the fifth metatarsal.  The remainder of the visualized bones appear intact.  Very mild degenerative changes.  Calcaneal spurring at the plantar fascia insertion.  IMPRESSION:  1.  Suggestion of a faint linear lucency through the head of the fifth metatarsal (MTP joint) may represent a normal nutrient foramen or a nondisplaced fracture.  Recommend clinical correlation for point tenderness at this site. 2.  The bones appear mildly osteopenic.   Original Report  Authenticated By: Malachy Moan, M.D.     Lab Results: Basic Metabolic Panel:  Recent Labs  81/19/14 0436 12/09/12 0431  NA 140 140  K 3.8 3.7  CL 100 102  CO2 30 30  GLUCOSE 121* 109*  BUN 8 15  CREATININE 0.43* 0.54  CALCIUM 9.1 8.9   Liver Function Tests: No results found for this basename: AST, ALT, ALKPHOS, BILITOT, PROT, ALBUMIN,  in the last 72 hours   CBC:  Recent Labs  12/07/12 0436 12/09/12 0431  WBC 7.7 9.5  HGB 13.1 12.7  HCT 38.6 37.7  MCV 95.5 96.7  PLT 480* 482*    Recent Results (from the past 240 hour(s))  MRSA PCR SCREENING     Status: Abnormal   Collection Time    12/03/12  8:56 AM      Result Value Range Status   MRSA by PCR POSITIVE (*) NEGATIVE Final   Comment:            The GeneXpert MRSA Assay (FDA     approved for NASAL specimens     only), is one component of a     comprehensive MRSA colonization     surveillance program. It is not     intended to diagnose MRSA     infection nor to guide or     monitor treatment for     MRSA infections.     CRITICAL RESULT CALLED TO, READ BACK BY AND VERIFIED WITH:     ROGERS, A. AT 11:15AM ON 12/03/12 BY PRUITT, C.     Hospital Course:  This is 57 years old female with history of multiple medical illness was admitted due to pulmonary Embolism which diagnosed with CT Scan. She was also found to atrial fibrillation with rapid response. She also had pneumonia. Cardiology and pulmonary consult was done. Patient was treated and fully anticoagulated. She  Has improved and will be discharged back to assisted living.  Discharge Exam: Blood pressure 135/71, pulse 82, temperature 98.4 F (36.9 C), temperature source Oral, resp. rate 20, height 5\' 5"  (1.651 m), weight 126.7 kg (279 lb 5.2 oz), SpO2 91.00%.   Disposition:  Assisted living.  Follow-up Information   Follow up with [facility]. Schedule an appointment as soon as possible for a visit in 4 days. (for monitoring  PT/INR and coumadin dose)       Follow up with Sayyid Harewood, MD In 2 weeks.   Contact information:   [address] [place] [address] [phone]       Signed: Sami Roes  12/09/2012, 8:12 AM

## 2012-12-09 NOTE — Clinical Social Work Note (Signed)
Patient ready to discharge today and return to [facility].  Patient agreeable to return to [facility].  Facility informed and agreeable, will transport patient to facility in [facility] [place].  FL2 reviewed w RN and updated as needed.  Discharge summary faxed to facility.  Discharge packet prepared and placed w shadow chart for transport.  SW signing off as no further SW needs identified.  Santa Genera, LCSW Clinical Social Worker [phone])

## 2012-12-11 ENCOUNTER — Emergency Department (HOSPITAL_COMMUNITY)
Admission: EM | Admit: 2012-12-11 | Discharge: 2012-12-11 | Disposition: A | Discharge status: Home / Self Care | Payer: Medicare Other | Attending: Emergency Medicine | Admitting: Emergency Medicine

## 2012-12-11 ENCOUNTER — Encounter (HOSPITAL_COMMUNITY): Payer: Self-pay

## 2012-12-11 DIAGNOSIS — S0990XA Unspecified injury of head, initial encounter: Secondary | ICD-10-CM | POA: Insufficient documentation

## 2012-12-11 DIAGNOSIS — G40909 Epilepsy, unspecified, not intractable, without status epilepticus: Secondary | ICD-10-CM | POA: Insufficient documentation

## 2012-12-11 DIAGNOSIS — Z91040 Latex allergy status: Secondary | ICD-10-CM | POA: Insufficient documentation

## 2012-12-11 DIAGNOSIS — G4730 Sleep apnea, unspecified: Secondary | ICD-10-CM | POA: Insufficient documentation

## 2012-12-11 DIAGNOSIS — Z79899 Other long term (current) drug therapy: Secondary | ICD-10-CM | POA: Insufficient documentation

## 2012-12-11 DIAGNOSIS — W19XXXA Unspecified fall, initial encounter: Secondary | ICD-10-CM

## 2012-12-11 DIAGNOSIS — I10 Essential (primary) hypertension: Secondary | ICD-10-CM | POA: Insufficient documentation

## 2012-12-11 DIAGNOSIS — Y9289 Other specified places as the place of occurrence of the external cause: Secondary | ICD-10-CM | POA: Insufficient documentation

## 2012-12-11 DIAGNOSIS — W050XXA Fall from non-moving wheelchair, initial encounter: Secondary | ICD-10-CM | POA: Insufficient documentation

## 2012-12-11 DIAGNOSIS — Z8739 Personal history of other diseases of the musculoskeletal system and connective tissue: Secondary | ICD-10-CM | POA: Insufficient documentation

## 2012-12-11 DIAGNOSIS — I2510 Atherosclerotic heart disease of native coronary artery without angina pectoris: Secondary | ICD-10-CM | POA: Insufficient documentation

## 2012-12-11 DIAGNOSIS — M129 Arthropathy, unspecified: Secondary | ICD-10-CM | POA: Insufficient documentation

## 2012-12-11 DIAGNOSIS — Z8673 Personal history of transient ischemic attack (TIA), and cerebral infarction without residual deficits: Secondary | ICD-10-CM | POA: Insufficient documentation

## 2012-12-11 DIAGNOSIS — Z8541 Personal history of malignant neoplasm of cervix uteri: Secondary | ICD-10-CM | POA: Insufficient documentation

## 2012-12-11 DIAGNOSIS — Z8669 Personal history of other diseases of the nervous system and sense organs: Secondary | ICD-10-CM | POA: Insufficient documentation

## 2012-12-11 DIAGNOSIS — Z87891 Personal history of nicotine dependence: Secondary | ICD-10-CM | POA: Insufficient documentation

## 2012-12-11 DIAGNOSIS — Y9389 Activity, other specified: Secondary | ICD-10-CM | POA: Insufficient documentation

## 2012-12-11 DIAGNOSIS — IMO0002 Reserved for concepts with insufficient information to code with codable children: Secondary | ICD-10-CM | POA: Insufficient documentation

## 2012-12-11 DIAGNOSIS — F411 Generalized anxiety disorder: Secondary | ICD-10-CM | POA: Insufficient documentation

## 2012-12-11 DIAGNOSIS — Z8719 Personal history of other diseases of the digestive system: Secondary | ICD-10-CM | POA: Insufficient documentation

## 2012-12-11 NOTE — ED Provider Notes (Signed)
History  This chart was scribed for Deborah Hutching, MD by Bennett Scrape, ED Scribe. This patient was seen in room [facility]04/[facility]04 and the patient's care was started at 10:34 PM.  CSN: [id number]  Arrival date & time 12/11/12  2209   First MD Initiated Contact with Patient 12/11/12 2234      Chief Complaint  Patient presents with  . Fall    The history is provided by the patient. No language interpreter was used.    HPI Comments: Deborah Maddox is a 57 y.o. female brought in by ambulance from [facility] in a c-collar and on a LSB, who presents to the Emergency Department complaining of a fall. Pt reports that she was trying to get out of the wheelchair when it slide backwards causing her to fall backwards. She states that she hit the back of her head on the wheelchair. She denies neck pain, LOC and back pain as associated symptoms. She has a h/o CAD, HTN and seizures.    Past Medical History  Diagnosis Date  . Coronary artery disease   . Hypertension   . Arthritis   . Sleep apnea   . Cancer     cervical  . Stroke     at age 64, left sided weakness  . PONV (postoperative nausea and vomiting)   . Anxiety   . Seizures     last one 10 years ago  . Foot drop     left foot  . Paralysis     left arm    Past Surgical History  Procedure Laterality Date  . Leg surgery    . Cervical cone biopsy    . Breast surgery    . Tonsillectomy    . Leg surgery      left  . Colonoscopy  2006    Dr. Gabriel Cirri: normal colonoscopy   . Open reduction internal fixation (orif) hand      right hand and arm  . Dilation and curettage of uterus    . Colonoscopy with propofol N/A 10/06/2012    Procedure: COLONOSCOPY WITH PROPOFOL;  Surgeon: Corbin Ade, MD;  Location: [facility];  Service: Endoscopy;  Laterality: N/A;  entered cecum @ 0905; total cecal withdrawal time=   . Polypectomy N/A 10/06/2012    Procedure: POLYPECTOMY;  Surgeon: Corbin Ade, MD;  Location: [facility];  Service: Endoscopy;   Laterality: N/A;  hepatic flexure polyp    Family History  Problem Relation Age of Onset  . Diabetes Mother   . Diabetes Sister   . Cancer Brother     "all over"   . Cancer Other   . Diabetes Other   . Colon cancer      unsure    History  Substance Use Topics  . Smoking status: Former Smoker -- 5 years    Quit date: 03/25/1981  . Smokeless tobacco: Never Used  . Alcohol Use: No    OB History   Grav Para Term Preterm Abortions TAB SAB Ect Mult Living   3 2 2  1  1          Review of Systems  A complete 10 system review of systems was obtained and all systems are negative except as noted in the HPI and PMH.   Allergies  Latex and Other  Home Medications   Current Outpatient Rx  Name  Route  Sig  Dispense  Refill  . amoxicillin-clavulanate (AUGMENTIN) 500-125 MG per tablet  Oral   Take 1 tablet (500 mg total) by mouth 3 (three) times daily.   15 tablet   0   . Cholecalciferol (VITAMIN D) 2000 UNITS CAPS   Oral   Take 1 capsule by mouth daily.         . cyanocobalamin (,VITAMIN B-12,) 1000 MCG/ML injection   Intramuscular   Inject 1,000 mcg into the muscle every 30 (thirty) days.         Marland Kitchen diltiazem (CARDIZEM CD) 360 MG 24 hr capsule   Oral   Take 1 capsule (360 mg total) by mouth daily.   30 capsule   3   . docusate sodium (COLACE) 100 MG capsule   Oral   Take 100 mg by mouth daily.           . DULoxetine (CYMBALTA) 60 MG capsule   Oral   Take 60 mg by mouth daily.           . fluticasone (FLONASE) 50 MCG/ACT nasal spray   Each Nare   Place 2 sprays into both nostrils daily.         Marland Kitchen gabapentin (NEURONTIN) 300 MG capsule   Oral   Take 300-600 mg by mouth 4 (four) times daily. Take 1 (300mg ) capsule at 0800, 1(300mg ) capsule at 1200, 1 (300mg ) capsule at 1600, and 2 (600mg ) capsules at bedtime         . HYDROcodone-acetaminophen (NORCO/VICODIN) 5-325 MG per tablet   Oral   Take 1 tablet by mouth every 6 (six) hours as needed for  pain.   40 tablet   0   . Lactobacillus (ACIDOPHILUS) TABS   Oral   Take 1 tablet by mouth daily.         Marland Kitchen lamoTRIgine (LAMICTAL) 25 MG tablet   Oral   Take 25 mg by mouth 2 (two) times daily.           Marland Kitchen Linaclotide (LINZESS) 290 MCG CAPS   Oral   Take 1 capsule by mouth daily. 30 minutes before breakfast.   30 capsule   3   . lisinopril (PRINIVIL,ZESTRIL) 40 MG tablet   Oral   Take 40 mg by mouth daily.           Marland Kitchen loratadine (CLARITIN) 10 MG tablet   Oral   Take 10 mg by mouth daily.         Marland Kitchen lovastatin (MEVACOR) 20 MG tablet   Oral   Take 20 mg by mouth at bedtime.         Marland Kitchen omeprazole (PRILOSEC) 20 MG capsule   Oral   Take 20 mg by mouth daily.           . phenytoin (DILANTIN) 100 MG ER capsule   Oral   Take 200-300 mg by mouth 2 (two) times daily. Take 2 capsules (200mg ) by mouth every morning and 3 capsules (300mg ) by mouth every evening.         . senna (SENOKOT) 8.6 MG TABS   Oral   Take 2 tablets by mouth daily.         . sertraline (ZOLOFT) 100 MG tablet   Oral   Take 100 mg by mouth every morning.          . tolterodine (DETROL) 2 MG tablet   Oral   Take 2 mg by mouth 2 (two) times daily.         Marland Kitchen warfarin (COUMADIN) 5 MG tablet  Oral   Take 1 tablet (5 mg total) by mouth daily.   30 tablet   0   . butalbital-acetaminophen-caffeine (FIORICET, ESGIC) 50-325-40 MG per tablet   Oral   Take 1 tablet by mouth every 6 (six) hours as needed for headache.         . guaiFENesin (MUCINEX) 600 MG 12 hr tablet   Oral   Take 600 mg by mouth 2 (two) times daily as needed for congestion.         . polycarbophil (FIBER-LAX) 625 MG tablet   Oral   Take 625 mg by mouth daily as needed (constipation).         . prochlorperazine (COMPAZINE) 10 MG tablet   Oral   Take 10 mg by mouth every 6 (six) hours as needed (nausea).           Triage Vitals: BP 124/73  Pulse 88  Temp(Src) 99.3 F (37.4 C) (Oral)  Resp 20  Ht 5'  5" (1.651 m)  Wt 300 lb (136.079 kg)  BMI 49.92 kg/m2  SpO2 97%  Physical Exam  Nursing note and vitals reviewed. Constitutional: She is oriented to person, place, and time. She appears well-developed and well-nourished.  HENT:  Head: Normocephalic and atraumatic.  No obvious trauma   Eyes: Conjunctivae and EOM are normal. Pupils are equal, round, and reactive to light.  Neck: Normal range of motion. Neck supple.  Entire cervical spine is non-tender  Cardiovascular: Normal rate, regular rhythm and normal heart sounds.   Pulmonary/Chest: Effort normal and breath sounds normal.  Abdominal: Soft. Bowel sounds are normal.  Musculoskeletal: Normal range of motion.  Entire back is non-tender  Neurological: She is alert and oriented to person, place, and time.  Skin: Skin is warm and dry.  Psychiatric: She has a normal mood and affect.    ED Course  Procedures (including critical care time)  DIAGNOSTIC STUDIES: Oxygen Saturation is 97% on room air, normal by my interpretation.    COORDINATION OF CARE: 10:35 PM- C-collar removed.  10:36 PM- Pt taken off of spine board.   10:38 PM-Advised pt that she is stable and that no testing is needed. Discussed discharge plan which includes sending the pt back to the SNF with pt at bedside and pt agreed to plan.   Labs Reviewed - No data to display No results found.   No diagnosis found.    MDM  No cervical tenderness. No occipital or scalp tenderness.   She is alert without neurological deficits. Observation only    I personally performed the services described in this documentation, which was scribed in my presence. The recorded information has been reviewed and is accurate.      Deborah Hutching, MD 12/11/12 [phone]

## 2012-12-11 NOTE — ED Notes (Signed)
Pt is from [facility],  States she fell while trying to get up from a wheelchair, states she hit the back of her head on the wheelchair.  Pt is fully immob per ems

## 2012-12-14 ENCOUNTER — Ambulatory Visit (INDEPENDENT_AMBULATORY_CARE_PROVIDER_SITE_OTHER): Payer: Medicare Other | Admitting: *Deleted

## 2012-12-14 DIAGNOSIS — I4891 Unspecified atrial fibrillation: Secondary | ICD-10-CM | POA: Insufficient documentation

## 2012-12-14 DIAGNOSIS — I2699 Other pulmonary embolism without acute cor pulmonale: Secondary | ICD-10-CM

## 2012-12-14 DIAGNOSIS — Z7901 Long term (current) use of anticoagulants: Secondary | ICD-10-CM

## 2012-12-14 LAB — POCT INR: INR: 4.1

## 2012-12-22 ENCOUNTER — Ambulatory Visit (INDEPENDENT_AMBULATORY_CARE_PROVIDER_SITE_OTHER): Payer: Medicare Other | Admitting: *Deleted

## 2012-12-22 ENCOUNTER — Encounter: Payer: Medicare Other | Admitting: Adult Health

## 2012-12-22 DIAGNOSIS — Z7901 Long term (current) use of anticoagulants: Secondary | ICD-10-CM

## 2012-12-22 DIAGNOSIS — I2699 Other pulmonary embolism without acute cor pulmonale: Secondary | ICD-10-CM

## 2012-12-22 DIAGNOSIS — I4891 Unspecified atrial fibrillation: Secondary | ICD-10-CM

## 2012-12-22 LAB — POCT INR: INR: 5.3

## 2012-12-26 ENCOUNTER — Encounter: Payer: Medicare Other | Admitting: Adult Health

## 2012-12-29 ENCOUNTER — Ambulatory Visit (INDEPENDENT_AMBULATORY_CARE_PROVIDER_SITE_OTHER): Payer: Medicare Other | Admitting: *Deleted

## 2012-12-29 DIAGNOSIS — I2699 Other pulmonary embolism without acute cor pulmonale: Secondary | ICD-10-CM

## 2012-12-29 DIAGNOSIS — I4891 Unspecified atrial fibrillation: Secondary | ICD-10-CM

## 2012-12-29 DIAGNOSIS — Z7901 Long term (current) use of anticoagulants: Secondary | ICD-10-CM

## 2012-12-29 LAB — POCT INR: INR: 1.8

## 2012-12-30 ENCOUNTER — Encounter: Payer: Medicare Other | Admitting: Adult Health

## 2012-12-30 NOTE — Progress Notes (Signed)
NO show

## 2013-01-12 ENCOUNTER — Ambulatory Visit (INDEPENDENT_AMBULATORY_CARE_PROVIDER_SITE_OTHER): Payer: Medicare Other | Admitting: *Deleted

## 2013-01-12 DIAGNOSIS — Z7901 Long term (current) use of anticoagulants: Secondary | ICD-10-CM

## 2013-01-12 DIAGNOSIS — I2699 Other pulmonary embolism without acute cor pulmonale: Secondary | ICD-10-CM

## 2013-01-12 DIAGNOSIS — I4891 Unspecified atrial fibrillation: Secondary | ICD-10-CM

## 2013-01-12 LAB — POCT INR: INR: 1.6

## 2013-01-25 ENCOUNTER — Ambulatory Visit (INDEPENDENT_AMBULATORY_CARE_PROVIDER_SITE_OTHER): Payer: Medicare Other | Admitting: *Deleted

## 2013-01-25 DIAGNOSIS — I2699 Other pulmonary embolism without acute cor pulmonale: Secondary | ICD-10-CM

## 2013-01-25 DIAGNOSIS — I4891 Unspecified atrial fibrillation: Secondary | ICD-10-CM

## 2013-01-25 DIAGNOSIS — Z7901 Long term (current) use of anticoagulants: Secondary | ICD-10-CM

## 2013-02-09 ENCOUNTER — Ambulatory Visit (INDEPENDENT_AMBULATORY_CARE_PROVIDER_SITE_OTHER): Payer: Medicare Other | Admitting: *Deleted

## 2013-02-09 ENCOUNTER — Encounter: Payer: Self-pay | Admitting: Adult Health

## 2013-02-09 ENCOUNTER — Ambulatory Visit (INDEPENDENT_AMBULATORY_CARE_PROVIDER_SITE_OTHER): Payer: Medicaid Other | Admitting: Adult Health

## 2013-02-09 VITALS — BP 144/85 | HR 100 | Ht 65.0 in | Wt 276.4 lb

## 2013-02-09 DIAGNOSIS — I10 Essential (primary) hypertension: Secondary | ICD-10-CM

## 2013-02-09 DIAGNOSIS — I4891 Unspecified atrial fibrillation: Secondary | ICD-10-CM

## 2013-02-09 DIAGNOSIS — I2699 Other pulmonary embolism without acute cor pulmonale: Secondary | ICD-10-CM

## 2013-02-09 DIAGNOSIS — I4892 Unspecified atrial flutter: Secondary | ICD-10-CM

## 2013-02-09 DIAGNOSIS — Z7901 Long term (current) use of anticoagulants: Secondary | ICD-10-CM

## 2013-02-09 NOTE — Assessment & Plan Note (Signed)
Continues on coumadin without complaints of chest pain or shortness of breath. She is very sedentary.

## 2013-02-09 NOTE — Patient Instructions (Addendum)
PLEASE FOLLOW UP IN 6 MONTHS  NO CHANGES WERE MADE TODAY

## 2013-02-09 NOTE — Assessment & Plan Note (Signed)
Blood pressure

## 2013-02-09 NOTE — Assessment & Plan Note (Signed)
She is tachycardic but not in atrial flutter at this time. She will continue diltiazem dose as directed. No changes in her medications.

## 2013-02-09 NOTE — Progress Notes (Signed)
HPI Deborah Maddox is a 57 year old patient of Dr.Wall we are following for ongoing assessment and management of atrial fibrillation, hypertension, with history of CAD, CVA with left-sided hemiplegia, and history of pulmonary emboli. The patient was recently admitted to [facility] on 12/03/2012, in the setting of pulmonary emboli. She was found to have new-onset atrial fibrillation with rapid ventricular response. The patient was started on anticoagulation with Coumadin. He is followed in our [facility] office for dosing.     She is doing well, is wheelchair bound.  Has severe depression with some cognitive slowing. She is compliant with medications and is followed in SNF. No chest pain or edema.No heart racing or palpitations.       Allergies  Allergen Reactions  . Latex Rash  . Other Rash    HEART  MONITOR TAPE: severe rash, bleeding    Current Outpatient Prescriptions  Medication Sig Dispense Refill  . aspirin 325 MG tablet Take 325 mg by mouth daily.      . butalbital-acetaminophen-caffeine (FIORICET, ESGIC) 50-325-40 MG per tablet Take 1 tablet by mouth every 6 (six) hours as needed for headache.      . Chlorphen-Pseudoephed-APAP (CORICIDIN D PO) Take by mouth.      . Cholecalciferol (VITAMIN D) 2000 UNITS CAPS Take 1 capsule by mouth daily.      . cyanocobalamin (,VITAMIN B-12,) 1000 MCG/ML injection Inject 1,000 mcg into the muscle every 30 (thirty) days.      Marland Kitchen diltiazem (CARDIZEM CD) 360 MG 24 hr capsule Take 300 mg by mouth daily.      Marland Kitchen docusate sodium (COLACE) 100 MG capsule Take 100 mg by mouth daily.        . DULoxetine (CYMBALTA) 60 MG capsule Take 60 mg by mouth daily.        . fluticasone (FLONASE) 50 MCG/ACT nasal spray Place 2 sprays into both nostrils daily.      Marland Kitchen gabapentin (NEURONTIN) 300 MG capsule Take 300-600 mg by mouth 4 (four) times daily. Take 1 (300mg ) capsule at 0800, 1(300mg ) capsule at 1200, 1 (300mg ) capsule at 1600, and 2 (600mg ) capsules at bedtime       . guaiFENesin (MUCINEX) 600 MG 12 hr tablet Take 600 mg by mouth 2 (two) times daily as needed for congestion.      Marland Kitchen HYDROcodone-acetaminophen (NORCO/VICODIN) 5-325 MG per tablet Take 1 tablet by mouth every 6 (six) hours as needed for pain.  40 tablet  0  . Lactobacillus (ACIDOPHILUS) TABS Take 1 tablet by mouth daily.      Marland Kitchen lamoTRIgine (LAMICTAL) 25 MG tablet Take 25 mg by mouth 2 (two) times daily.        . Linaclotide (LINZESS) 290 MCG CAPS Take 1 capsule by mouth daily. 30 minutes before breakfast.  30 capsule  3  . lisinopril (PRINIVIL,ZESTRIL) 40 MG tablet Take 40 mg by mouth daily.        Marland Kitchen lovastatin (MEVACOR) 20 MG tablet Take 20 mg by mouth at bedtime.      Marland Kitchen omeprazole (PRILOSEC) 20 MG capsule Take 20 mg by mouth daily.        . phenytoin (DILANTIN) 100 MG ER capsule Take 200-300 mg by mouth 2 (two) times daily. Take 2 capsules (200mg ) by mouth every morning and 3 capsules (300mg ) by mouth every evening.      . polycarbophil (FIBER-LAX) 625 MG tablet Take 625 mg by mouth daily as needed (constipation).      . prochlorperazine (  COMPAZINE) 10 MG tablet Take 10 mg by mouth every 6 (six) hours as needed (nausea).      . senna (SENOKOT) 8.6 MG TABS Take 2 tablets by mouth daily.      . sertraline (ZOLOFT) 100 MG tablet Take 100 mg by mouth every morning.       . tolterodine (DETROL) 2 MG tablet Take 2 mg by mouth 2 (two) times daily.      Marland Kitchen warfarin (COUMADIN) 5 MG tablet Take 1 tablet (5 mg total) by mouth daily.  30 tablet  0   No current facility-administered medications for this visit.    Past Medical History  Diagnosis Date  . Coronary artery disease   . Hypertension   . Arthritis   . Sleep apnea   . Cancer     cervical  . Stroke     at age 4, left sided weakness  . PONV (postoperative nausea and vomiting)   . Anxiety   . Seizures     last one 10 years ago  . Foot drop     left foot  . Paralysis     left arm    Past Surgical History  Procedure Laterality  Date  . Leg surgery    . Cervical cone biopsy    . Breast surgery    . Tonsillectomy    . Leg surgery      left  . Colonoscopy  2006    Dr. Gabriel Cirri: normal colonoscopy   . Open reduction internal fixation (orif) hand      right hand and arm  . Dilation and curettage of uterus    . Colonoscopy with propofol N/A 10/06/2012    Procedure: COLONOSCOPY WITH PROPOFOL;  Surgeon: Corbin Ade, MD;  Location: [facility];  Service: Endoscopy;  Laterality: N/A;  entered cecum @ 0905; total cecal withdrawal time=   . Polypectomy N/A 10/06/2012    Procedure: POLYPECTOMY;  Surgeon: Corbin Ade, MD;  Location: [facility];  Service: Endoscopy;  Laterality: N/A;  hepatic flexure polyp    ZOX:WRUEAV of systems complete and found to be negative unless listed above  PHYSICAL EXAM BP 144/85  Pulse 100  Ht 5\' 5"  (1.651 m)  Wt 276 lb 6.4 oz (125.374 kg)  BMI 46 kg/m2  WUJ:[id number] rate of 101.   ASSESSMENT AND PLAN

## 2013-03-01 ENCOUNTER — Emergency Department (HOSPITAL_COMMUNITY): Payer: Medicare Other

## 2013-03-01 ENCOUNTER — Encounter (HOSPITAL_COMMUNITY): Payer: Self-pay | Admitting: Emergency Medicine

## 2013-03-01 ENCOUNTER — Emergency Department (HOSPITAL_COMMUNITY)
Admission: EM | Admit: 2013-03-01 | Discharge: 2013-03-01 | Disposition: A | Discharge status: Home / Self Care | Payer: Medicare Other | Attending: Emergency Medicine | Admitting: Emergency Medicine

## 2013-03-01 DIAGNOSIS — I2510 Atherosclerotic heart disease of native coronary artery without angina pectoris: Secondary | ICD-10-CM | POA: Insufficient documentation

## 2013-03-01 DIAGNOSIS — Z87891 Personal history of nicotine dependence: Secondary | ICD-10-CM | POA: Insufficient documentation

## 2013-03-01 DIAGNOSIS — Z8774 Personal history of (corrected) congenital malformations of heart and circulatory system: Secondary | ICD-10-CM | POA: Insufficient documentation

## 2013-03-01 DIAGNOSIS — R079 Chest pain, unspecified: Secondary | ICD-10-CM | POA: Insufficient documentation

## 2013-03-01 DIAGNOSIS — M549 Dorsalgia, unspecified: Secondary | ICD-10-CM | POA: Insufficient documentation

## 2013-03-01 DIAGNOSIS — I10 Essential (primary) hypertension: Secondary | ICD-10-CM | POA: Insufficient documentation

## 2013-03-01 DIAGNOSIS — G40909 Epilepsy, unspecified, not intractable, without status epilepticus: Secondary | ICD-10-CM | POA: Insufficient documentation

## 2013-03-01 DIAGNOSIS — R51 Headache: Secondary | ICD-10-CM | POA: Insufficient documentation

## 2013-03-01 DIAGNOSIS — M7989 Other specified soft tissue disorders: Secondary | ICD-10-CM | POA: Insufficient documentation

## 2013-03-01 DIAGNOSIS — Z8669 Personal history of other diseases of the nervous system and sense organs: Secondary | ICD-10-CM | POA: Insufficient documentation

## 2013-03-01 DIAGNOSIS — Z91040 Latex allergy status: Secondary | ICD-10-CM | POA: Insufficient documentation

## 2013-03-01 DIAGNOSIS — Z79899 Other long term (current) drug therapy: Secondary | ICD-10-CM | POA: Insufficient documentation

## 2013-03-01 DIAGNOSIS — Z8541 Personal history of malignant neoplasm of cervix uteri: Secondary | ICD-10-CM | POA: Insufficient documentation

## 2013-03-01 DIAGNOSIS — Z8673 Personal history of transient ischemic attack (TIA), and cerebral infarction without residual deficits: Secondary | ICD-10-CM | POA: Insufficient documentation

## 2013-03-01 DIAGNOSIS — F411 Generalized anxiety disorder: Secondary | ICD-10-CM | POA: Insufficient documentation

## 2013-03-01 DIAGNOSIS — Z7901 Long term (current) use of anticoagulants: Secondary | ICD-10-CM | POA: Insufficient documentation

## 2013-03-01 DIAGNOSIS — Z8739 Personal history of other diseases of the musculoskeletal system and connective tissue: Secondary | ICD-10-CM | POA: Insufficient documentation

## 2013-03-01 HISTORY — DX: Congenital malformation of heart, unspecified: Q24.9

## 2013-03-01 HISTORY — DX: Foot drop, left foot: M21.372

## 2013-03-01 LAB — CBC WITH DIFFERENTIAL/PLATELET
Basophils Absolute: 0 10*3/uL (ref 0.0–0.1)
Basophils Relative: 1 % (ref 0–1)
Eosinophils Absolute: 0.2 10*3/uL (ref 0.0–0.7)
Eosinophils Relative: 3 % (ref 0–5)
HCT: 37.8 % (ref 36.0–46.0)
MCH: 31.8 pg (ref 26.0–34.0)
MCHC: 33.1 g/dL (ref 30.0–36.0)
Monocytes Absolute: 0.9 10*3/uL (ref 0.1–1.0)
Neutro Abs: 2.9 10*3/uL (ref 1.7–7.7)
RDW: 14 % (ref 11.5–15.5)

## 2013-03-01 LAB — BASIC METABOLIC PANEL
BUN: 11 mg/dL (ref 6–23)
Calcium: 9.1 mg/dL (ref 8.4–10.5)
Chloride: 100 mEq/L (ref 96–112)
Creatinine, Ser: 0.52 mg/dL (ref 0.50–1.10)
GFR calc Af Amer: >90 mL/min (ref >90)
GFR calc non Af Amer: >90 mL/min (ref >90)

## 2013-03-01 LAB — TROPONIN I: Troponin I: <0.3 ng/mL (ref <0.30)

## 2013-03-01 MED ORDER — SODIUM CHLORIDE 0.9 % IV BOLUS (SEPSIS)
250.0000 mL | Freq: Once | INTRAVENOUS | Status: AC
  Administered 2013-03-01: 250 mL via INTRAVENOUS

## 2013-03-01 MED ORDER — IOHEXOL 350 MG/ML SOLN
100.0000 mL | Freq: Once | INTRAVENOUS | Status: AC | PRN
  Administered 2013-03-01: 100 mL via INTRAVENOUS

## 2013-03-01 MED ORDER — SODIUM CHLORIDE 0.9 % IV SOLN: INTRAVENOUS | Status: DC

## 2013-03-01 MED ORDER — ONDANSETRON HCL 4 MG/2ML IJ SOLN
4.0000 mg | Freq: Once | INTRAMUSCULAR | Status: AC
  Administered 2013-03-01: 4 mg via INTRAVENOUS
  Filled 2013-03-01: qty 2

## 2013-03-01 MED ORDER — HYDROMORPHONE HCL PF 1 MG/ML IJ SOLN
0.5000 mg | Freq: Once | INTRAMUSCULAR | Status: AC
  Administered 2013-03-01: 0.5 mg via INTRAVENOUS
  Filled 2013-03-01: qty 1

## 2013-03-01 NOTE — ED Provider Notes (Signed)
History  This chart was scribed for Shelda Jakes, MD by Bennett Scrape, ED Scribe. This patient was seen in room APA07/[facility]07 and the patient's care was started at 9:19 AM.  CSN: [id number] Arrival date & time 03/01/13  0801  First MD Initiated Contact with Patient 03/01/13 [phone]     Chief Complaint  Patient presents with  . Chest Pain    Patient is a 57 y.o. female presenting with chest pain. The history is provided by the patient. No language interpreter was used.  Chest Pain Pain quality: pressure   Duration:  20 minutes Timing:  Constant Progression:  Resolved Chronicity:  New Associated symptoms: back pain and headache   Associated symptoms: no abdominal pain, no cough, no fever, no nausea, no shortness of breath and not vomiting     HPI Comments: Deborah Maddox is a 57 y.o. female who presents to the Emergency Department from [facility] complaining of one episode of CP now resolved that started this morning after feeling depressed. She reports that the episode occurred around 6:30 AM and lasted for 15 to 20 minutes. She states that her CP resolved around her arrival time to the ED around 8:15 AM. When asked about the time discrepancy, pt stated "I don't own a watch or clock and my memory isn't that great". She describes the CP as a pressure sensation that was relieved by burping. She states that she developed a left-sided HA that radiates into the left face before the CP and states that it is still present but located in the occipital and left frontal area currently. She reports prior episodes of similar HAs but denies taking any OTC medications for it at home. She admits that she came to the ED for the CP and the HA is not overly alarming to her. She reports having new onset back pain between the shoulder blades but denies nausea, SOB, new ankle swelling and diaphoresis. She is currently on coumadin.  PCP is Dr. Rhea Pink at [facility] for the past 2 years   Past Medical  History  Diagnosis Date  . Coronary artery disease   . Hypertension   . Arthritis   . Sleep apnea   . Cancer     cervical  . Stroke     at age 65, left sided weakness  . PONV (postoperative nausea and vomiting)   . Anxiety   . Seizures     last one 10 years ago  . Foot drop     left foot  . Paralysis     left arm  . 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 Left foot drop    Past Surgical History  Procedure Laterality Date  . Leg surgery    . Cervical cone biopsy    . Breast surgery    . Tonsillectomy    . Leg surgery      left  . Colonoscopy  2006    Dr. Gabriel Cirri: normal colonoscopy   . Open reduction internal fixation (orif) hand      right hand and arm  . Dilation and curettage of uterus    . Colonoscopy with propofol N/A 10/06/2012    Procedure: COLONOSCOPY WITH PROPOFOL;  Surgeon: Corbin Ade, MD;  Location: [facility];  Service: Endoscopy;  Laterality: N/A;  entered cecum @ 0905; total cecal withdrawal time=   . Polypectomy N/A 10/06/2012    Procedure: POLYPECTOMY;  Surgeon:  Corbin Ade, MD;  Location: [facility];  Service: Endoscopy;  Laterality: N/A;  hepatic flexure polyp   Family History  Problem Relation Age of Onset  . Diabetes Mother   . Diabetes Sister   . Cancer Brother     "all over"   . Cancer Other   . Diabetes Other   . Colon cancer      unsure   History  Substance Use Topics  . Smoking status: Former Smoker -- 5 years    Quit date: 03/25/1981  . Smokeless tobacco: Never Used  . Alcohol Use: No   OB History   Grav Para Term Preterm Abortions TAB SAB Ect Mult Living   3 2 2  1  1         Review of Systems  Constitutional: Negative for fever and chills.  HENT: Negative for congestion, sore throat and neck pain.   Eyes: Negative for visual disturbance.  Respiratory: Negative for cough and shortness of breath.   Cardiovascular: Positive for chest pain and leg swelling (chronic left ankle swelling).   Gastrointestinal: Negative for nausea, vomiting, abdominal pain and diarrhea.  Genitourinary: Negative for dysuria.  Musculoskeletal: Positive for back pain.  Skin: Negative for rash.  Neurological: Positive for headaches.  Hematological: Bruises/bleeds easily (on coumadin).  Psychiatric/Behavioral: Negative for confusion.    Allergies  Latex and Other  Home Medications   Current Outpatient Rx  Name  Route  Sig  Dispense  Refill  . Cholecalciferol (VITAMIN D) 2000 UNITS CAPS   Oral   Take 1 capsule by mouth daily.         . cyanocobalamin (,VITAMIN B-12,) 1000 MCG/ML injection   Intramuscular   Inject 1,000 mcg into the muscle every 30 (thirty) days.         Marland Kitchen diltiazem (TIAZAC) 300 MG 24 hr capsule   Oral   Take 300 mg by mouth daily.         Marland Kitchen docusate sodium (COLACE) 100 MG capsule   Oral   Take 100 mg by mouth daily.           . DULoxetine (CYMBALTA) 60 MG capsule   Oral   Take 60 mg by mouth daily.           . fluticasone (FLONASE) 50 MCG/ACT nasal spray   Each Nare   Place 2 sprays into both nostrils daily.         Marland Kitchen gabapentin (NEURONTIN) 300 MG capsule   Oral   Take 300-600 mg by mouth 3 (three) times daily. Takes 300 mg three times daily and 600 mg at bedtime.         . Lactobacillus (ACIDOPHILUS PO)   Oral   Take 1 tablet by mouth daily.         . Lactobacillus (ACIDOPHILUS) TABS   Oral   Take 1 tablet by mouth daily.         Marland Kitchen lamoTRIgine (LAMICTAL) 25 MG tablet   Oral   Take 25 mg by mouth 2 (two) times daily.           Marland Kitchen Linaclotide (LINZESS) 290 MCG CAPS   Oral   Take 1 capsule by mouth daily. 30 minutes before breakfast.   30 capsule   3   . lisinopril (PRINIVIL,ZESTRIL) 40 MG tablet   Oral   Take 40 mg by mouth daily.           Marland Kitchen loratadine (CLARITIN) 10 MG tablet  Oral   Take 10 mg by mouth daily.         Marland Kitchen lovastatin (MEVACOR) 20 MG tablet   Oral   Take 20 mg by mouth at bedtime.         Marland Kitchen  omeprazole (PRILOSEC) 20 MG capsule   Oral   Take 20 mg by mouth daily.           . phenytoin (DILANTIN) 100 MG ER capsule   Oral   Take 200-300 mg by mouth 2 (two) times daily. Take 2 capsules (200mg ) by mouth every morning and 3 capsules (300mg ) by mouth every evening.         . senna (SENOKOT) 8.6 MG TABS   Oral   Take 2 tablets by mouth daily.         . sertraline (ZOLOFT) 100 MG tablet   Oral   Take 100 mg by mouth every morning.          . tolterodine (DETROL) 2 MG tablet   Oral   Take 2 mg by mouth 2 (two) times daily.         Marland Kitchen warfarin (COUMADIN) 5 MG tablet   Oral   Take 2.5 mg by mouth daily. Take 2.5 mg every day except for Tuesday when patient takes 5 mg.         . butalbital-acetaminophen-caffeine (FIORICET, ESGIC) 50-325-40 MG per tablet   Oral   Take 1 tablet by mouth every 6 (six) hours as needed for headache.         . Chlorphen-Pseudoephed-APAP (CORICIDIN D PO)   Oral   Take 1 tablet by mouth every 6 (six) hours as needed (Congestion).          Marland Kitchen guaiFENesin (MUCINEX) 600 MG 12 hr tablet   Oral   Take 600 mg by mouth 2 (two) times daily as needed for congestion.         Marland Kitchen HYDROcodone-acetaminophen (NORCO/VICODIN) 5-325 MG per tablet   Oral   Take 1 tablet by mouth every 6 (six) hours as needed for pain.   40 tablet   0   . polycarbophil (FIBER-LAX) 625 MG tablet   Oral   Take 625 mg by mouth daily as needed (constipation).         . prochlorperazine (COMPAZINE) 10 MG tablet   Oral   Take 10 mg by mouth every 6 (six) hours as needed (nausea).          Triage Vitals: BP 135/74  Pulse 79  Temp(Src) 97.8 F (36.6 C) (Oral)  Resp 20  Wt 276 lb (125.193 kg)  BMI 45.93 kg/m2  SpO2 100%  Physical Exam  Nursing note and vitals reviewed. Constitutional: She is oriented to person, place, and time. She appears well-developed and well-nourished. No distress.  HENT:  Head: Normocephalic and atraumatic.  Mouth/Throat:  Oropharynx is clear and moist.  Eyes: Conjunctivae and EOM are normal. Pupils are equal, round, and reactive to light.  Sclera are clear  Neck: Neck supple. No tracheal deviation present.  Cardiovascular: Normal rate and regular rhythm.   No murmur heard. Pulses:      Dorsalis pedis pulses are 2+ on the right side, and 2+ on the left side.  Pulmonary/Chest: Effort normal and breath sounds normal. No respiratory distress. She has no wheezes.  Abdominal: Soft. Bowel sounds are normal. She exhibits no distension. There is no tenderness.  Musculoskeletal: Normal range of motion. She exhibits no edema (no pittin edema ).  Minimal swelling to the left ankle compared to the right  Lymphadenopathy:    She has no cervical adenopathy.  Neurological: She is alert and oriented to person, place, and time. No cranial nerve deficit.  Pt able to move both sets of fingers and toes, weakness on left side from prior CVA  Skin: Skin is warm and dry. No rash noted.  Psychiatric: She has a normal mood and affect. Her behavior is normal.    ED Course  Procedures (including critical care time)  Medications  0.9 %  sodium chloride infusion ( Intravenous Rate/Dose Change 03/01/13 1115)  sodium chloride 0.9 % bolus 250 mL (0 mLs Intravenous Stopped 03/01/13 1115)  ondansetron (ZOFRAN) injection 4 mg (4 mg Intravenous Given 03/01/13 1049)  HYDROmorphone (DILAUDID) injection 0.5 mg (0.5 mg Intravenous Given 03/01/13 1050)  iohexol (OMNIPAQUE) 350 MG/ML injection 100 mL (100 mLs Intravenous Contrast Given 03/01/13 1023)    DIAGNOSTIC STUDIES: Oxygen Saturation is 100% on room air, normal by my interpretation.    COORDINATION OF CARE: 9:27 AM-Discussed treatment plan which includes medication, CXR, CT angio chest, CBC panel, BMP and troponin with pt at bedside and pt agreed to plan.   Labs Reviewed  CBC WITH DIFFERENTIAL - Abnormal; Notable for the following:    Monocytes Relative 14 (*)    All other components  within normal limits  BASIC METABOLIC PANEL - Abnormal; Notable for the following:    Glucose, Bld 115 (*)    All other components within normal limits  PROTIME-INR - Abnormal; Notable for the following:    Prothrombin Time 25.4 (*)    INR 2.40 (*)    All other components within normal limits  TROPONIN I  TROPONIN I   Dg Chest 1 View  03/01/2013   *RADIOLOGY REPORT*  Clinical Data: Chest pain  CHEST - 1 VIEW  Comparison: Chest radiograph and chest CT December 03, 2012  Findings:  Lungs clear.  Heart is upper normal in size with normal pulmonary vascularity.  No adenopathy.  There is degenerative change in the thoracic spine.  IMPRESSION:   No edema or consolidation.   Original Report Authenticated By: Bretta Bang, M.D.   Ct Angio Chest Pe W/cm &/or Wo Cm  03/01/2013   *RADIOLOGY REPORT*  Clinical Data: Pain, leg swelling and shortness of breath.  CT ANGIOGRAPHY CHEST  Technique:  Multidetector CT imaging of the chest using the standard protocol during bolus administration of intravenous contrast. Multiplanar reconstructed images including MIPs were obtained and reviewed to evaluate the vascular anatomy.  Contrast: OMNIPAQUE IOHEXOL 350 MG/ML SOLN  Comparison: Plain film chest earlier this same day and CT chest 12/03/2012.  Findings: No pulmonary embolus is identified.  There is cardiomegaly.  No pleural or pericardial effusion.  There is no axillary, hilar or mediastinal lymphadenopathy.  Lungs demonstrate a nodular opacity deep in the right lung base measuring 1.6 x 1.1 cm on image 70.  Mild dependent atelectasis is also seen.  Incidentally imaged upper abdomen is unremarkable.  No focal bony abnormality is identified with scattered thoracic degenerative change noted.  IMPRESSION:  1.  Negative for pulmonary embolus. 2.  1.6 cm nodular opacity deep in the right lung base is likely the sequela of prior right pulmonary embolus and pulmonary infarct. Repeat CT scan of the chest without contrast  in 3 months is recommended to ensure stability or resolution. 3.  Cardiomegaly.   Original Report Authenticated By: Holley Dexter, M.D.   Results for orders placed  during the hospital encounter of 03/01/13  CBC WITH DIFFERENTIAL      Result Value Range   WBC 6.4  4.0 - 10.5 K/uL   RBC 3.93  3.87 - 5.11 MIL/uL   Hemoglobin 12.5  12.0 - 15.0 g/dL   HCT 78.2  95.6 - 21.3 %   MCV 96.2  78.0 - 100.0 fL   MCH 31.8  26.0 - 34.0 pg   MCHC 33.1  30.0 - 36.0 g/dL   RDW 08.6  57.8 - 46.9 %   Platelets 315  150 - 400 K/uL   Neutrophils Relative % 46  43 - 77 %   Neutro Abs 2.9  1.7 - 7.7 K/uL   Lymphocytes Relative 37  12 - 46 %   Lymphs Abs 2.4  0.7 - 4.0 K/uL   Monocytes Relative 14 (*) 3 - 12 %   Monocytes Absolute 0.9  0.1 - 1.0 K/uL   Eosinophils Relative 3  0 - 5 %   Eosinophils Absolute 0.2  0.0 - 0.7 K/uL   Basophils Relative 1  0 - 1 %   Basophils Absolute 0.0  0.0 - 0.1 K/uL  BASIC METABOLIC PANEL      Result Value Range   Sodium 138  135 - 145 mEq/L   Potassium 4.2  3.5 - 5.1 mEq/L   Chloride 100  96 - 112 mEq/L   CO2 32  19 - 32 mEq/L   Glucose, Bld 115 (*) 70 - 99 mg/dL   BUN 11  6 - 23 mg/dL   Creatinine, Ser 6.29  0.50 - 1.10 mg/dL   Calcium 9.1  8.4 - 52.8 mg/dL   GFR calc non Af Amer >90  >90 mL/min   GFR calc Af Amer >90  >90 mL/min  TROPONIN I      Result Value Range   Troponin I <0.30  <0.30 ng/mL  PROTIME-INR      Result Value Range   Prothrombin Time 25.4 (*) 11.6 - 15.2 seconds   INR 2.40 (*) 0.00 - 1.49  TROPONIN I      Result Value Range   Troponin I <0.30  <0.30 ng/mL    Date: 03/01/2013  Rate: 79  Rhythm: normal sinus rhythm and premature ventricular contractions (PVC)  QRS Axis: normal  Intervals: normal  ST/T Wave abnormalities: nonspecific T wave changes  Conduction Disutrbances:first-degree A-V block   Narrative Interpretation:   Old EKG Reviewed: changes noted No atrial fibrillation compared to EKG from 12/07/2012   1. Chest pain      MDM  Workup for chest pain without any acute findings. EKG with resolution of prior atrial fib. Troponin x2 negative patient is chest pain-free. Patient's had a history of PE in the past CT angios done no signs of recurrent pulmonary embolism. Patient's INR is therapeutic. Chest x-rays negative for pneumonia pneumothorax or pulmonary edema. Patient will be discharged back to nursing facility.    I personally performed the services described in this documentation, which was scribed in my presence. The recorded information has been reviewed and is accurate.     Shelda Jakes, MD 03/01/13 [phone]

## 2013-03-01 NOTE — ED Notes (Signed)
States that she felt depressed this morning and started having a headache and then started having chest pain that she describes as pressure sensation that was relieved by burping, states she did have some nausea that stopped.  States that her chest pain is currently resolved.  States that her only complaint now is a headache.

## 2013-03-01 NOTE — ED Notes (Signed)
[facility] called and report given to RN taking care of pt. [facility] reported would not have a driver to pick up pt until 3pm. ED Charge RN aware and reported okay for pt to wait for ride after discharge.

## 2013-03-02 ENCOUNTER — Ambulatory Visit (INDEPENDENT_AMBULATORY_CARE_PROVIDER_SITE_OTHER): Payer: Medicare Other | Admitting: *Deleted

## 2013-03-02 ENCOUNTER — Other Ambulatory Visit: Payer: Self-pay | Admitting: Cardiology

## 2013-03-02 DIAGNOSIS — Z7901 Long term (current) use of anticoagulants: Secondary | ICD-10-CM

## 2013-03-02 DIAGNOSIS — I4891 Unspecified atrial fibrillation: Secondary | ICD-10-CM

## 2013-03-02 DIAGNOSIS — I2699 Other pulmonary embolism without acute cor pulmonale: Secondary | ICD-10-CM

## 2013-03-03 NOTE — Telephone Encounter (Signed)
Medication sent via escribe.  

## 2013-03-30 ENCOUNTER — Ambulatory Visit (INDEPENDENT_AMBULATORY_CARE_PROVIDER_SITE_OTHER): Payer: Medicare Other | Admitting: *Deleted

## 2013-03-30 DIAGNOSIS — I4891 Unspecified atrial fibrillation: Secondary | ICD-10-CM

## 2013-03-30 DIAGNOSIS — Z7901 Long term (current) use of anticoagulants: Secondary | ICD-10-CM

## 2013-03-30 DIAGNOSIS — I2699 Other pulmonary embolism without acute cor pulmonale: Secondary | ICD-10-CM

## 2013-04-20 ENCOUNTER — Ambulatory Visit (INDEPENDENT_AMBULATORY_CARE_PROVIDER_SITE_OTHER): Payer: Medicare Other | Admitting: *Deleted

## 2013-04-20 DIAGNOSIS — I4891 Unspecified atrial fibrillation: Secondary | ICD-10-CM

## 2013-04-20 DIAGNOSIS — Z7901 Long term (current) use of anticoagulants: Secondary | ICD-10-CM

## 2013-04-20 DIAGNOSIS — I2699 Other pulmonary embolism without acute cor pulmonale: Secondary | ICD-10-CM

## 2013-04-20 LAB — POCT INR: INR: 1.4

## 2013-05-04 ENCOUNTER — Ambulatory Visit (INDEPENDENT_AMBULATORY_CARE_PROVIDER_SITE_OTHER): Payer: Medicare Other | Admitting: *Deleted

## 2013-05-04 DIAGNOSIS — Z7901 Long term (current) use of anticoagulants: Secondary | ICD-10-CM

## 2013-05-04 DIAGNOSIS — I2699 Other pulmonary embolism without acute cor pulmonale: Secondary | ICD-10-CM

## 2013-05-04 DIAGNOSIS — I4891 Unspecified atrial fibrillation: Secondary | ICD-10-CM

## 2013-05-04 LAB — POCT INR: INR: 2.1

## 2013-06-01 ENCOUNTER — Ambulatory Visit (INDEPENDENT_AMBULATORY_CARE_PROVIDER_SITE_OTHER): Payer: Medicare Other | Admitting: *Deleted

## 2013-06-01 DIAGNOSIS — I4891 Unspecified atrial fibrillation: Secondary | ICD-10-CM

## 2013-06-01 DIAGNOSIS — I2699 Other pulmonary embolism without acute cor pulmonale: Secondary | ICD-10-CM

## 2013-06-01 DIAGNOSIS — Z7901 Long term (current) use of anticoagulants: Secondary | ICD-10-CM

## 2013-06-01 LAB — POCT INR: INR: 4.2

## 2013-06-22 ENCOUNTER — Ambulatory Visit (INDEPENDENT_AMBULATORY_CARE_PROVIDER_SITE_OTHER): Payer: Medicare Other | Admitting: *Deleted

## 2013-06-22 DIAGNOSIS — I4891 Unspecified atrial fibrillation: Secondary | ICD-10-CM

## 2013-06-22 DIAGNOSIS — Z7901 Long term (current) use of anticoagulants: Secondary | ICD-10-CM

## 2013-06-22 DIAGNOSIS — I2699 Other pulmonary embolism without acute cor pulmonale: Secondary | ICD-10-CM

## 2013-06-22 LAB — POCT INR: INR: 3.2

## 2013-07-12 ENCOUNTER — Other Ambulatory Visit: Payer: Self-pay | Admitting: Cardiology

## 2013-07-20 ENCOUNTER — Ambulatory Visit (INDEPENDENT_AMBULATORY_CARE_PROVIDER_SITE_OTHER): Payer: Medicare Other | Admitting: *Deleted

## 2013-07-20 DIAGNOSIS — I2699 Other pulmonary embolism without acute cor pulmonale: Secondary | ICD-10-CM

## 2013-07-20 DIAGNOSIS — I4891 Unspecified atrial fibrillation: Secondary | ICD-10-CM

## 2013-07-20 DIAGNOSIS — Z7901 Long term (current) use of anticoagulants: Secondary | ICD-10-CM

## 2013-07-20 LAB — POCT INR: INR: 3.2

## 2013-08-17 ENCOUNTER — Ambulatory Visit (INDEPENDENT_AMBULATORY_CARE_PROVIDER_SITE_OTHER): Payer: Medicare Other | Admitting: *Deleted

## 2013-08-17 DIAGNOSIS — I4891 Unspecified atrial fibrillation: Secondary | ICD-10-CM

## 2013-08-17 DIAGNOSIS — Z7901 Long term (current) use of anticoagulants: Secondary | ICD-10-CM

## 2013-08-17 DIAGNOSIS — Z5181 Encounter for therapeutic drug level monitoring: Secondary | ICD-10-CM

## 2013-08-17 DIAGNOSIS — I2699 Other pulmonary embolism without acute cor pulmonale: Secondary | ICD-10-CM

## 2013-08-17 LAB — POCT INR: INR: 2.5

## 2013-09-14 ENCOUNTER — Ambulatory Visit (INDEPENDENT_AMBULATORY_CARE_PROVIDER_SITE_OTHER): Payer: Medicare Other | Admitting: *Deleted

## 2013-09-14 DIAGNOSIS — I2699 Other pulmonary embolism without acute cor pulmonale: Secondary | ICD-10-CM

## 2013-09-14 DIAGNOSIS — I4891 Unspecified atrial fibrillation: Secondary | ICD-10-CM

## 2013-09-14 DIAGNOSIS — Z5181 Encounter for therapeutic drug level monitoring: Secondary | ICD-10-CM

## 2013-09-14 DIAGNOSIS — Z7901 Long term (current) use of anticoagulants: Secondary | ICD-10-CM

## 2013-09-14 LAB — POCT INR: INR: 2.3

## 2013-10-18 ENCOUNTER — Ambulatory Visit (INDEPENDENT_AMBULATORY_CARE_PROVIDER_SITE_OTHER): Payer: Medicare Other | Admitting: *Deleted

## 2013-10-18 DIAGNOSIS — Z7901 Long term (current) use of anticoagulants: Secondary | ICD-10-CM

## 2013-10-18 DIAGNOSIS — Z5181 Encounter for therapeutic drug level monitoring: Secondary | ICD-10-CM

## 2013-10-18 DIAGNOSIS — I4891 Unspecified atrial fibrillation: Secondary | ICD-10-CM

## 2013-10-18 DIAGNOSIS — I2699 Other pulmonary embolism without acute cor pulmonale: Secondary | ICD-10-CM

## 2013-10-18 LAB — POCT INR: INR: 2.2

## 2013-11-29 ENCOUNTER — Ambulatory Visit (INDEPENDENT_AMBULATORY_CARE_PROVIDER_SITE_OTHER): Payer: Medicare Other | Admitting: *Deleted

## 2013-11-29 DIAGNOSIS — I2699 Other pulmonary embolism without acute cor pulmonale: Secondary | ICD-10-CM

## 2013-11-29 DIAGNOSIS — I4891 Unspecified atrial fibrillation: Secondary | ICD-10-CM

## 2013-11-29 DIAGNOSIS — Z5181 Encounter for therapeutic drug level monitoring: Secondary | ICD-10-CM

## 2013-11-29 DIAGNOSIS — Z7901 Long term (current) use of anticoagulants: Secondary | ICD-10-CM

## 2013-11-29 LAB — POCT INR: INR: 2.1

## 2013-12-20 ENCOUNTER — Emergency Department (HOSPITAL_COMMUNITY)
Admission: EM | Admit: 2013-12-20 | Discharge: 2013-12-20 | Disposition: A | Discharge status: Other Acute Inpatient Hospital | Payer: Medicare Other

## 2013-12-20 NOTE — ED Notes (Signed)
Caregiver handed me a note where pt was only here for lab work

## 2013-12-20 NOTE — ED Notes (Signed)
Pt c/o abcess to left side of chin.

## 2014-01-10 ENCOUNTER — Ambulatory Visit (INDEPENDENT_AMBULATORY_CARE_PROVIDER_SITE_OTHER): Payer: Medicare Other | Admitting: *Deleted

## 2014-01-10 DIAGNOSIS — I2699 Other pulmonary embolism without acute cor pulmonale: Secondary | ICD-10-CM

## 2014-01-10 DIAGNOSIS — I4891 Unspecified atrial fibrillation: Secondary | ICD-10-CM

## 2014-01-10 DIAGNOSIS — Z5181 Encounter for therapeutic drug level monitoring: Secondary | ICD-10-CM

## 2014-01-10 LAB — POCT INR: INR: 2.5

## 2014-02-07 ENCOUNTER — Ambulatory Visit (INDEPENDENT_AMBULATORY_CARE_PROVIDER_SITE_OTHER): Payer: Medicare Other | Admitting: *Deleted

## 2014-02-07 DIAGNOSIS — I2699 Other pulmonary embolism without acute cor pulmonale: Secondary | ICD-10-CM

## 2014-02-07 DIAGNOSIS — Z5181 Encounter for therapeutic drug level monitoring: Secondary | ICD-10-CM

## 2014-02-07 DIAGNOSIS — I4891 Unspecified atrial fibrillation: Secondary | ICD-10-CM

## 2014-02-07 LAB — POCT INR: INR: 2.3

## 2014-03-21 ENCOUNTER — Ambulatory Visit (INDEPENDENT_AMBULATORY_CARE_PROVIDER_SITE_OTHER): Payer: Medicare Other | Admitting: *Deleted

## 2014-03-21 DIAGNOSIS — I4891 Unspecified atrial fibrillation: Secondary | ICD-10-CM

## 2014-03-21 DIAGNOSIS — Z5181 Encounter for therapeutic drug level monitoring: Secondary | ICD-10-CM

## 2014-03-21 DIAGNOSIS — I2699 Other pulmonary embolism without acute cor pulmonale: Secondary | ICD-10-CM

## 2014-03-21 LAB — POCT INR: INR: 3.7

## 2014-04-05 ENCOUNTER — Other Ambulatory Visit: Payer: Self-pay | Admitting: *Deleted

## 2014-04-05 ENCOUNTER — Other Ambulatory Visit: Payer: Self-pay | Admitting: Cardiology

## 2014-04-09 ENCOUNTER — Ambulatory Visit (INDEPENDENT_AMBULATORY_CARE_PROVIDER_SITE_OTHER): Payer: Medicare Other | Admitting: *Deleted

## 2014-04-09 DIAGNOSIS — Z5181 Encounter for therapeutic drug level monitoring: Secondary | ICD-10-CM

## 2014-04-09 DIAGNOSIS — I2699 Other pulmonary embolism without acute cor pulmonale: Secondary | ICD-10-CM

## 2014-04-09 DIAGNOSIS — I4891 Unspecified atrial fibrillation: Secondary | ICD-10-CM

## 2014-04-09 LAB — POCT INR: INR: 2.6

## 2014-05-07 ENCOUNTER — Ambulatory Visit (INDEPENDENT_AMBULATORY_CARE_PROVIDER_SITE_OTHER): Payer: Medicare Other | Admitting: *Deleted

## 2014-05-07 DIAGNOSIS — Z5181 Encounter for therapeutic drug level monitoring: Secondary | ICD-10-CM

## 2014-05-07 DIAGNOSIS — I2699 Other pulmonary embolism without acute cor pulmonale: Secondary | ICD-10-CM

## 2014-05-07 DIAGNOSIS — I4891 Unspecified atrial fibrillation: Secondary | ICD-10-CM

## 2014-05-07 LAB — POCT INR: INR: 2.8

## 2014-05-10 ENCOUNTER — Emergency Department (HOSPITAL_COMMUNITY): Payer: Medicare Other

## 2014-05-10 ENCOUNTER — Encounter (HOSPITAL_COMMUNITY): Payer: Self-pay | Admitting: Emergency Medicine

## 2014-05-10 ENCOUNTER — Emergency Department (HOSPITAL_COMMUNITY)
Admission: EM | Admit: 2014-05-10 | Discharge: 2014-05-10 | Disposition: A | Discharge status: Home / Self Care | Payer: Medicare Other | Attending: Emergency Medicine | Admitting: Emergency Medicine

## 2014-05-10 DIAGNOSIS — M129 Arthropathy, unspecified: Secondary | ICD-10-CM | POA: Diagnosis not present

## 2014-05-10 DIAGNOSIS — Z7901 Long term (current) use of anticoagulants: Secondary | ICD-10-CM | POA: Insufficient documentation

## 2014-05-10 DIAGNOSIS — Q249 Congenital malformation of heart, unspecified: Secondary | ICD-10-CM | POA: Insufficient documentation

## 2014-05-10 DIAGNOSIS — R569 Unspecified convulsions: Secondary | ICD-10-CM | POA: Diagnosis not present

## 2014-05-10 DIAGNOSIS — Z8739 Personal history of other diseases of the musculoskeletal system and connective tissue: Secondary | ICD-10-CM | POA: Insufficient documentation

## 2014-05-10 DIAGNOSIS — Z8673 Personal history of transient ischemic attack (TIA), and cerebral infarction without residual deficits: Secondary | ICD-10-CM | POA: Diagnosis not present

## 2014-05-10 DIAGNOSIS — I2510 Atherosclerotic heart disease of native coronary artery without angina pectoris: Secondary | ICD-10-CM | POA: Insufficient documentation

## 2014-05-10 DIAGNOSIS — Z7982 Long term (current) use of aspirin: Secondary | ICD-10-CM | POA: Insufficient documentation

## 2014-05-10 DIAGNOSIS — Z8541 Personal history of malignant neoplasm of cervix uteri: Secondary | ICD-10-CM | POA: Insufficient documentation

## 2014-05-10 DIAGNOSIS — Z7952 Long term (current) use of systemic steroids: Secondary | ICD-10-CM | POA: Insufficient documentation

## 2014-05-10 DIAGNOSIS — I10 Essential (primary) hypertension: Secondary | ICD-10-CM | POA: Insufficient documentation

## 2014-05-10 DIAGNOSIS — Z91040 Latex allergy status: Secondary | ICD-10-CM | POA: Diagnosis not present

## 2014-05-10 DIAGNOSIS — Z87891 Personal history of nicotine dependence: Secondary | ICD-10-CM | POA: Diagnosis not present

## 2014-05-10 DIAGNOSIS — R42 Dizziness and giddiness: Secondary | ICD-10-CM | POA: Diagnosis present

## 2014-05-10 LAB — CBC WITH DIFFERENTIAL/PLATELET
BASOS ABS: 0 10*3/uL (ref 0.0–0.1)
Basophils Relative: 1 % (ref 0–1)
EOS PCT: 3 % (ref 0–5)
Eosinophils Absolute: 0.2 10*3/uL (ref 0.0–0.7)
HCT: 38.5 % (ref 36.0–46.0)
Hemoglobin: 12.7 g/dL (ref 12.0–15.0)
LYMPHS PCT: 45 % (ref 12–46)
Lymphs Abs: 2.8 10*3/uL (ref 0.7–4.0)
MCH: 30.8 pg (ref 26.0–34.0)
MCHC: 33 g/dL (ref 30.0–36.0)
MCV: 93.4 fL (ref 78.0–100.0)
Monocytes Absolute: 0.7 10*3/uL (ref 0.1–1.0)
Monocytes Relative: 12 % (ref 3–12)
NEUTROS ABS: 2.4 10*3/uL (ref 1.7–7.7)
Neutrophils Relative %: 39 % — ABNORMAL LOW (ref 43–77)
PLATELETS: 325 10*3/uL (ref 150–400)
RBC: 4.12 MIL/uL (ref 3.87–5.11)
RDW: 14.4 % (ref 11.5–15.5)
WBC: 6.2 10*3/uL (ref 4.0–10.5)

## 2014-05-10 LAB — PROTIME-INR
INR: 2.51 — ABNORMAL HIGH (ref 0.00–1.49)
Prothrombin Time: 27.1 seconds — ABNORMAL HIGH (ref 11.6–15.2)

## 2014-05-10 LAB — COMPREHENSIVE METABOLIC PANEL
ALT: 13 U/L (ref 0–35)
AST: 15 U/L (ref 0–37)
Albumin: 3.9 g/dL (ref 3.5–5.2)
Alkaline Phosphatase: 97 U/L (ref 39–117)
Anion gap: 14 (ref 5–15)
BUN: 12 mg/dL (ref 6–23)
CO2: 25 meq/L (ref 19–32)
Calcium: 9.4 mg/dL (ref 8.4–10.5)
Chloride: 105 mEq/L (ref 96–112)
Creatinine, Ser: 0.83 mg/dL (ref 0.50–1.10)
GFR calc Af Amer: 87 mL/min — ABNORMAL LOW (ref >90)
GFR, EST NON AFRICAN AMERICAN: 75 mL/min — AB (ref >90)
Glucose, Bld: 115 mg/dL — ABNORMAL HIGH (ref 70–99)
Potassium: 3.8 mEq/L (ref 3.7–5.3)
SODIUM: 144 meq/L (ref 137–147)
TOTAL PROTEIN: 7.7 g/dL (ref 6.0–8.3)
Total Bilirubin: 0.5 mg/dL (ref 0.3–1.2)

## 2014-05-10 LAB — URINALYSIS, ROUTINE W REFLEX MICROSCOPIC
Bilirubin Urine: NEGATIVE
Glucose, UA: NEGATIVE mg/dL
Hgb urine dipstick: NEGATIVE
Ketones, ur: NEGATIVE mg/dL
Leukocytes, UA: NEGATIVE
NITRITE: NEGATIVE
Protein, ur: NEGATIVE mg/dL
UROBILINOGEN UA: 0.2 mg/dL (ref 0.0–1.0)
pH: 5.5 (ref 5.0–8.0)

## 2014-05-10 LAB — TROPONIN I

## 2014-05-10 MED ORDER — ACETAMINOPHEN 325 MG PO TABS
650.0000 mg | ORAL_TABLET | Freq: Once | ORAL | Status: AC
  Administered 2014-05-10: 650 mg via ORAL
  Filled 2014-05-10: qty 2

## 2014-05-10 MED ORDER — MECLIZINE HCL 50 MG PO TABS: ORAL_TABLET | ORAL | Status: DC

## 2014-05-10 MED ORDER — MECLIZINE HCL 12.5 MG PO TABS
25.0000 mg | ORAL_TABLET | Freq: Once | ORAL | Status: AC
  Administered 2014-05-10: 25 mg via ORAL
  Filled 2014-05-10: qty 2

## 2014-05-10 NOTE — ED Provider Notes (Signed)
CSN: [id number]     Arrival date & time 05/10/14  0830 History   First MD Initiated Contact with Patient 05/10/14 0825     Chief Complaint  Patient presents with  . Dizziness     (Consider location/radiation/quality/duration/timing/severity/associated sxs/prior Treatment) HPI Comments: Patient is a 58 year old female of a local nursing facility who presents to the emergency department with slightly over 24 hours of dizziness. The patient was brought to the emergency department by EMS. The patient states that she's had a cerebrovascular accident in the past, and she is on Coumadin. She states that she has a sensation that her" head is spinning". The patient states it she has had this problem in the past, but this is the worst episode she's had a very long time. His been no recent injury or accident on to the head. There's been no recent change in medications. The patient denies any recent illnesses, in particular no known high fevers.  Is been no loss of consciousness reported and no chest pain no palpitations.  Patient is a 58 y.o. female presenting with dizziness. The history is provided by the patient and the EMS personnel.  Dizziness Quality:  Head spinning Severity:  Moderate Onset quality:  Gradual Duration:  1 day Timing:  Intermittent Progression:  Worsening Chronicity: acute on chronic. Context: standing up   Context: not with ear pain, not with head movement and not with loss of consciousness   Context comment:  Hx of dizziness from time to time, worse today Relieved by:  Being still Worsened by:  Standing up Associated symptoms: no blood in stool, no chest pain, no diarrhea, no nausea, no palpitations, no shortness of breath, no syncope, no tinnitus, no vision changes, no vomiting and no weakness   Risk factors: hx of stroke and hx of vertigo     Past Medical History  Diagnosis Date  . Coronary artery disease   . Hypertension   . Arthritis   . Sleep apnea   . Cancer      cervical  . Stroke     at age 60, left sided weakness  . PONV (postoperative nausea and vomiting)   . Anxiety   . Seizures     last one 10 years ago  . Foot drop     left foot  . Paralysis     left arm  . 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 Left foot drop    Past Surgical History  Procedure Laterality Date  . Leg surgery    . Cervical cone biopsy    . Breast surgery    . Tonsillectomy    . Leg surgery      left  . Colonoscopy  2006    Dr. Anthony Sar: normal colonoscopy   . Open reduction internal fixation (orif) hand      right hand and arm  . Dilation and curettage of uterus    . Colonoscopy with propofol N/A 10/06/2012    Procedure: COLONOSCOPY WITH PROPOFOL;  Surgeon: Daneil Dolin, MD;  Location: [facility];  Service: Endoscopy;  Laterality: N/A;  entered cecum @ 0905; total cecal withdrawal time=  70minutes  . Polypectomy N/A 10/06/2012    Procedure: POLYPECTOMY;  Surgeon: Daneil Dolin, MD;  Location: [facility];  Service: Endoscopy;  Laterality: N/A;  hepatic flexure polyp   Family History  Problem Relation Age of Onset  . Diabetes Mother   . Diabetes Sister   .  Cancer Brother     "all over"   . Cancer Other   . Diabetes Other   . Colon cancer      unsure   History  Substance Use Topics  . Smoking status: Former Smoker -- 5 years    Quit date: 03/25/1981  . Smokeless tobacco: Never Used  . Alcohol Use: No   OB History   Grav Para Term Preterm Abortions TAB SAB Ect Mult Living   3 2 2  1  1         Review of Systems  Constitutional: Negative for activity change.       All ROS Neg except as noted in HPI  HENT: Negative for nosebleeds and tinnitus.   Eyes: Negative for photophobia and discharge.  Respiratory: Negative for cough, shortness of breath and wheezing.   Cardiovascular: Negative for chest pain, palpitations and syncope.  Gastrointestinal: Negative for nausea, vomiting, abdominal pain, diarrhea and blood in stool.   Genitourinary: Negative for dysuria, frequency and hematuria.  Musculoskeletal: Negative for arthralgias, back pain and neck pain.  Skin: Negative.   Neurological: Positive for dizziness and weakness. Negative for seizures and speech difficulty.  Psychiatric/Behavioral: Negative for hallucinations and confusion.      Allergies  Latex and Other  Home Medications   Prior to Admission medications   Medication Sig Start Date End Date Taking? Authorizing Provider  butalbital-acetaminophen-caffeine (FIORICET, ESGIC) 50-325-40 MG per tablet Take 1 tablet by mouth every 6 (six) hours as needed for headache.    Historical Provider, MD  Chlorphen-Pseudoephed-APAP (CORICIDIN D PO) Take 1 tablet by mouth every 6 (six) hours as needed (Congestion).     Historical Provider, MD  Cholecalciferol (VITAMIN D) 2000 UNITS CAPS Take 1 capsule by mouth daily.    Historical Provider, MD  cyanocobalamin (,VITAMIN B-12,) 1000 MCG/ML injection Inject 1,000 mcg into the muscle every 30 (thirty) days.    Historical Provider, MD  diltiazem (TIAZAC) 300 MG 24 hr capsule Take 300 mg by mouth daily.    Historical Provider, MD  docusate sodium (COLACE) 100 MG capsule Take 100 mg by mouth daily.      Historical Provider, MD  DULoxetine (CYMBALTA) 60 MG capsule Take 60 mg by mouth daily.      Historical Provider, MD  fluticasone (FLONASE) 50 MCG/ACT nasal spray Place 2 sprays into both nostrils daily.    Historical Provider, MD  gabapentin (NEURONTIN) 300 MG capsule Take 300-600 mg by mouth 3 (three) times daily. Takes 300 mg three times daily and 600 mg at bedtime.    Historical Provider, MD  guaiFENesin (MUCINEX) 600 MG 12 hr tablet Take 600 mg by mouth 2 (two) times daily as needed for congestion.    Historical Provider, MD  HYDROcodone-acetaminophen (NORCO/VICODIN) 5-325 MG per tablet Take 1 tablet by mouth every 6 (six) hours as needed for pain. 12/01/12   Carole Civil, MD  Lactobacillus (ACIDOPHILUS PO) Take 1  tablet by mouth daily.    Historical Provider, MD  lamoTRIgine (LAMICTAL) 25 MG tablet Take 25 mg by mouth 2 (two) times daily.      Historical Provider, MD  Linaclotide Rolan Lipa) 290 MCG CAPS Take 1 capsule by mouth daily. 30 minutes before breakfast. 10/13/12   Orvil Feil, NP  lisinopril (PRINIVIL,ZESTRIL) 40 MG tablet Take 40 mg by mouth daily.      Historical Provider, MD  loratadine (CLARITIN) 10 MG tablet Take 10 mg by mouth daily.    Historical Provider, MD  lovastatin (MEVACOR) 20 MG tablet Take 20 mg by mouth at bedtime.    Historical Provider, MD  omeprazole (PRILOSEC) 20 MG capsule Take 20 mg by mouth daily.      Historical Provider, MD  phenytoin (DILANTIN) 100 MG ER capsule Take 200-300 mg by mouth 2 (two) times daily. Take 2 capsules (200mg ) by mouth every morning and 3 capsules (300mg ) by mouth every evening.    Historical Provider, MD  polycarbophil (FIBER-LAX) 625 MG tablet Take 625 mg by mouth daily as needed (constipation).    Historical Provider, MD  prochlorperazine (COMPAZINE) 10 MG tablet Take 10 mg by mouth every 6 (six) hours as needed (nausea).    Historical Provider, MD  senna (SENOKOT) 8.6 MG TABS Take 2 tablets by mouth daily.    Historical Provider, MD  sertraline (ZOLOFT) 100 MG tablet Take 100 mg by mouth every morning.     Historical Provider, MD  tolterodine (DETROL) 2 MG tablet Take 2 mg by mouth 2 (two) times daily.    Historical Provider, MD  warfarin (COUMADIN) 2.5 MG tablet TAKE 2 TABLETS (5mg ) BY MOUTH ON MONDAY. TAKE 1 TABLET (2.5mg ) ONCE DAILY ON ALL OTHER DAYS. 04/05/14   Lendon Colonel, NP   BP 119/55  Pulse 62  Resp 18  SpO2 97% Physical Exam  Nursing note and vitals reviewed. Constitutional: She is oriented to person, place, and time. She appears well-developed and well-nourished.  Non-toxic appearance.  HENT:  Head: Normocephalic.  Right Ear: Tympanic membrane and external ear normal.  Left Ear: Tympanic membrane and external ear normal.   Foreign body noted in the left ear.  Eyes: EOM and lids are normal. Pupils are equal, round, and reactive to light.  No nystagmus noted.  Neck: Normal range of motion. Neck supple. Carotid bruit is not present.  No carotid bruits appreciated.  Cardiovascular: Normal rate, regular rhythm, normal heart sounds, intact distal pulses and normal pulses.   Pulmonary/Chest: Breath sounds normal. No respiratory distress.  Abdominal: Soft. Bowel sounds are normal. There is no tenderness. There is no guarding.  Musculoskeletal: Normal range of motion.  Lymphadenopathy:       Head (right side): No submandibular adenopathy present.       Head (left side): No submandibular adenopathy present.    She has no cervical adenopathy.  Neurological: She is alert and oriented to person, place, and time. She has normal strength. No sensory deficit.  And there is flaccidness of the right upper and lower extremity.  Skin: Skin is warm and dry.  Psychiatric: She has a normal mood and affect. Her speech is normal.    ED Course  Procedures (including critical care time) Labs Review Labs Reviewed  CBC WITH DIFFERENTIAL  COMPREHENSIVE METABOLIC PANEL  PROTIME-INR  TROPONIN I  URINALYSIS, ROUTINE W REFLEX MICROSCOPIC    Imaging Review No results found.   EKG Interpretation   Date/Time:  Thursday May 10 2014 08:45:34 EDT Ventricular Rate:  61 PR Interval:  249 QRS Duration: 104 QT Interval:  432 QTC Calculation: 435 R Axis:   9 Text Interpretation:  Sinus rhythm Prolonged PR interval Low voltage,  precordial leads Borderline T wave abnormalities Nonspecific T wave  abnormality Confirmed by Wyvonnia Dusky  MD, STEPHEN [phone]) on 05/10/2014 8:58:42  AM      MDM  Patient presents to the emergency department with over 24 hours of dizziness. IV fluids started. Vital signs are within normal limits. Pulse oximetry is 97% on room air. Within normal  limits by my interpretation. Electrocardiogram shows a  sinus rhythm with some nonspecific T wave abnormality, no acute event noted on the EKG.  Patient has had history of cerebrovascular accident in the past, she is currently on Coumadin, and having increased dizziness compared to previous implants. Patient will receive MRI to rule out new cerebrovascular accident.  Dispense metabolic panel shows the electrolytes and the remainder of the metabolic panel to be within normal limits. Doubt metabolic source of the dizziness. Troponin I is negative for acute event, low suspicion for cardiac event causing dizziness. Urinalysis is negative, low suspicion for dizziness related to urinary tract infection or urosepsis. Complete blood count is well within normal limits. No evidence of anemia or other acute changes.  ProTime is 27.1, with INR of 2.51, within therapeutic range. MR of the brain without contrast reveals a remote right MCA territory infarct, as well as lacunar infarcts of the right cerebellum, there is no acute intracranial abnormality.  Patient treated in the emergency department with meclizine. Prescription for meclizine given to the patient. Patient is to be followed up by her nursing facility physician. The patient is to return to the emergency department if any unusual changes, problems, or concerns.    Final diagnoses:  None    *I have reviewed nursing notes, vital signs, and all appropriate lab and imaging results for this patient.Lenox Ahr, PA-C 05/10/14 1118

## 2014-05-10 NOTE — Discharge Instructions (Signed)
Vertigo Vertigo means you feel like you are moving when you are not. Vertigo can make you feel like things around you are moving when they are not. This problem often goes away on its own.  HOME CARE   Follow your doctor's instructions.  Avoid driving.  Avoid using heavy machinery.  Avoid doing any activity that could be dangerous if you have a vertigo attack.  Tell your doctor if a medicine seems to cause your vertigo. GET HELP RIGHT AWAY IF:   Your medicines do not help or make you feel worse.  You have trouble talking or walking.  You feel weak or have trouble using your arms, hands, or legs.  You have bad headaches.  You keep feeling sick to your stomach (nauseous) or throwing up (vomiting).  Your vision changes.  A family member notices changes in your behavior.  Your problems get worse. MAKE SURE YOU:  Understand these instructions.  Will watch your condition.  Will get help right away if you are not doing well or get worse. Document Released: 05/05/2008 Document Revised: 10/19/2011 Document Reviewed: 02/12/2011 [employer] Patient Information 2015 [employer]. This information is not intended to replace advice given to you by your health care provider. Make sure you discuss any questions you have with your health care provider.  

## 2014-05-10 NOTE — ED Notes (Signed)
EMS here to transport pt back to [facility]. Dc paperwork given to pt/ems along with rx.

## 2014-05-10 NOTE — ED Provider Notes (Signed)
Medical screening examination/treatment/procedure(s) were conducted as a shared visit with non-physician practitioner(s) and myself.  I personally evaluated the patient during the encounter.  Hx stroke with L hemiparesis, vertigo x 1 day, "head spinning" sensation worse with movement.  No headache or vision change. No change in L sided weakness. No nystagmus.  L sided weakness. L facial droop. No ataxia with finger to nose with R hand. Unable to test L.  EKG Interpretation   Date/Time:  Thursday May 10 2014 08:45:34 EDT Ventricular Rate:  61 PR Interval:  249 QRS Duration: 104 QT Interval:  432 QTC Calculation: 435 R Axis:   9 Text Interpretation:  Sinus rhythm Prolonged PR interval Low voltage,  precordial leads Borderline T wave abnormalities Nonspecific T wave  abnormality Confirmed by Wyvonnia Dusky  MD, Hailly Fess ([id number]) on 05/10/2014 8:58:42  AM       Ezequiel Essex, MD 05/10/14 1801

## 2014-05-10 NOTE — ED Notes (Signed)
Nad noted prior to dc. Dc instructions reviewed. Will call EMS for transport.

## 2014-05-10 NOTE — ED Notes (Signed)
C/o dizziness x 24 hours per EMS. Hx of same per EMS. Pt sent from [facility].

## 2014-05-10 NOTE — ED Notes (Signed)
Report given to [place].  

## 2014-05-10 NOTE — ED Notes (Signed)
Pt up to bedside commode with assistance. Denies any difficulties or increase of dizziness. Pt able to stand and pivot to bedside commode without difficulty. Pt back in bed at this time. nad noted. No complaints. cb at side.

## 2014-05-10 NOTE — ED Notes (Signed)
Pt still waiting for EMS to transport back.

## 2014-06-04 ENCOUNTER — Ambulatory Visit (INDEPENDENT_AMBULATORY_CARE_PROVIDER_SITE_OTHER): Payer: Medicare Other | Admitting: *Deleted

## 2014-06-04 DIAGNOSIS — I4891 Unspecified atrial fibrillation: Secondary | ICD-10-CM

## 2014-06-04 DIAGNOSIS — I2699 Other pulmonary embolism without acute cor pulmonale: Secondary | ICD-10-CM

## 2014-06-04 DIAGNOSIS — Z5181 Encounter for therapeutic drug level monitoring: Secondary | ICD-10-CM

## 2014-06-04 LAB — POCT INR: INR: 2.6

## 2014-06-11 ENCOUNTER — Encounter (HOSPITAL_COMMUNITY): Payer: Self-pay | Admitting: Emergency Medicine

## 2014-07-16 ENCOUNTER — Ambulatory Visit (INDEPENDENT_AMBULATORY_CARE_PROVIDER_SITE_OTHER): Payer: Medicare Other | Admitting: *Deleted

## 2014-07-16 DIAGNOSIS — I2699 Other pulmonary embolism without acute cor pulmonale: Secondary | ICD-10-CM

## 2014-07-16 DIAGNOSIS — I4891 Unspecified atrial fibrillation: Secondary | ICD-10-CM

## 2014-07-16 DIAGNOSIS — Z5181 Encounter for therapeutic drug level monitoring: Secondary | ICD-10-CM

## 2014-07-16 LAB — POCT INR: INR: 2.2

## 2014-08-27 ENCOUNTER — Ambulatory Visit (INDEPENDENT_AMBULATORY_CARE_PROVIDER_SITE_OTHER): Payer: Medicare Other | Admitting: *Deleted

## 2014-08-27 DIAGNOSIS — I2699 Other pulmonary embolism without acute cor pulmonale: Secondary | ICD-10-CM

## 2014-08-27 DIAGNOSIS — Z5181 Encounter for therapeutic drug level monitoring: Secondary | ICD-10-CM

## 2014-08-27 DIAGNOSIS — I4891 Unspecified atrial fibrillation: Secondary | ICD-10-CM

## 2014-08-27 LAB — POCT INR: INR: 2.4

## 2014-08-28 ENCOUNTER — Emergency Department (HOSPITAL_COMMUNITY)
Admission: EM | Admit: 2014-08-28 | Discharge: 2014-08-28 | Disposition: A | Discharge status: Home / Self Care | Payer: Medicare Other | Attending: Emergency Medicine | Admitting: Emergency Medicine

## 2014-08-28 ENCOUNTER — Encounter (HOSPITAL_COMMUNITY): Payer: Self-pay | Admitting: Emergency Medicine

## 2014-08-28 ENCOUNTER — Emergency Department (HOSPITAL_COMMUNITY): Payer: Medicare Other

## 2014-08-28 DIAGNOSIS — W19XXXA Unspecified fall, initial encounter: Secondary | ICD-10-CM

## 2014-08-28 DIAGNOSIS — W06XXXA Fall from bed, initial encounter: Secondary | ICD-10-CM | POA: Diagnosis not present

## 2014-08-28 DIAGNOSIS — I2510 Atherosclerotic heart disease of native coronary artery without angina pectoris: Secondary | ICD-10-CM | POA: Diagnosis not present

## 2014-08-28 DIAGNOSIS — S4992XA Unspecified injury of left shoulder and upper arm, initial encounter: Secondary | ICD-10-CM | POA: Diagnosis not present

## 2014-08-28 DIAGNOSIS — Y9389 Activity, other specified: Secondary | ICD-10-CM | POA: Insufficient documentation

## 2014-08-28 DIAGNOSIS — Z8673 Personal history of transient ischemic attack (TIA), and cerebral infarction without residual deficits: Secondary | ICD-10-CM | POA: Insufficient documentation

## 2014-08-28 DIAGNOSIS — M1990 Unspecified osteoarthritis, unspecified site: Secondary | ICD-10-CM | POA: Diagnosis not present

## 2014-08-28 DIAGNOSIS — S8991XA Unspecified injury of right lower leg, initial encounter: Secondary | ICD-10-CM | POA: Insufficient documentation

## 2014-08-28 DIAGNOSIS — Z87891 Personal history of nicotine dependence: Secondary | ICD-10-CM | POA: Insufficient documentation

## 2014-08-28 DIAGNOSIS — Z7901 Long term (current) use of anticoagulants: Secondary | ICD-10-CM | POA: Diagnosis not present

## 2014-08-28 DIAGNOSIS — Y998 Other external cause status: Secondary | ICD-10-CM | POA: Diagnosis not present

## 2014-08-28 DIAGNOSIS — Y9289 Other specified places as the place of occurrence of the external cause: Secondary | ICD-10-CM | POA: Insufficient documentation

## 2014-08-28 DIAGNOSIS — Z8669 Personal history of other diseases of the nervous system and sense organs: Secondary | ICD-10-CM | POA: Diagnosis not present

## 2014-08-28 DIAGNOSIS — Z7951 Long term (current) use of inhaled steroids: Secondary | ICD-10-CM | POA: Diagnosis not present

## 2014-08-28 DIAGNOSIS — M25561 Pain in right knee: Secondary | ICD-10-CM

## 2014-08-28 DIAGNOSIS — F419 Anxiety disorder, unspecified: Secondary | ICD-10-CM | POA: Diagnosis not present

## 2014-08-28 DIAGNOSIS — I10 Essential (primary) hypertension: Secondary | ICD-10-CM | POA: Diagnosis not present

## 2014-08-28 DIAGNOSIS — Z79899 Other long term (current) drug therapy: Secondary | ICD-10-CM | POA: Insufficient documentation

## 2014-08-28 DIAGNOSIS — Z8541 Personal history of malignant neoplasm of cervix uteri: Secondary | ICD-10-CM | POA: Insufficient documentation

## 2014-08-28 DIAGNOSIS — Z91040 Latex allergy status: Secondary | ICD-10-CM | POA: Diagnosis not present

## 2014-08-28 DIAGNOSIS — Z8774 Personal history of (corrected) congenital malformations of heart and circulatory system: Secondary | ICD-10-CM | POA: Diagnosis not present

## 2014-08-28 LAB — URINALYSIS, ROUTINE W REFLEX MICROSCOPIC
Bilirubin Urine: NEGATIVE
Glucose, UA: NEGATIVE mg/dL
Hgb urine dipstick: NEGATIVE
Ketones, ur: NEGATIVE mg/dL
LEUKOCYTES UA: NEGATIVE
Nitrite: NEGATIVE
Protein, ur: NEGATIVE mg/dL
Specific Gravity, Urine: 1.01 (ref 1.005–1.030)
Urobilinogen, UA: 0.2 mg/dL (ref 0.0–1.0)
pH: 6 (ref 5.0–8.0)

## 2014-08-28 NOTE — ED Notes (Signed)
Warm blankets applied

## 2014-08-28 NOTE — ED Notes (Signed)
Report given to [facility]. [facility] transported pt via stretcher

## 2014-08-28 NOTE — ED Notes (Signed)
Pt kicking bair hugger off. States she wants her cloths back on and to go home. Pt made aware her temp was low and we need to keep her a while longer

## 2014-08-28 NOTE — ED Provider Notes (Signed)
Pt initially presented after mechanical fall. W/u reassuring, but noted to be hypothermic. Improved but now she would like to be discharged. Remains mildly hypothermic prior to DC. She has no further complaints. At this time I think DC is reasonable.   Virgel Manifold, MD 08/28/14 [phone]

## 2014-08-28 NOTE — ED Provider Notes (Signed)
CSN: [id number]     Arrival date & time 08/28/14  0530 History   First MD Initiated Contact with Patient 08/28/14 [phone]     Chief Complaint  Patient presents with  . Fall     (Consider location/radiation/quality/duration/timing/severity/associated sxs/prior Treatment) HPI Comments: Patient with history of left-sided hemiparesis from previous stroke presenting with pain to right knee and left shoulder after a fall while trying to get out of bed this morning. Patient states she went to stand up to get out of bed and fell onto her right knee. She is not certain if she hit her head. Denies losing consciousness. Denies head, neck or back pain. Denies any chest pain, shortness of breath or abdominal pain. She has pain in her right knee and left shoulder. Her left-sided weakness is unchanged. She denies any dizziness or vertigo. She denies any syncope. She is on Coumadin.  The history is provided by the patient and the EMS personnel.    Past Medical History  Diagnosis Date  . Coronary artery disease   . Hypertension   . Arthritis   . Sleep apnea   . Cancer     cervical  . Stroke     at age 85, left sided weakness  . PONV (postoperative nausea and vomiting)   . Anxiety   . Seizures     last one 10 years ago  . Foot drop     left foot  . Paralysis     left arm  . 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 Left foot drop    Past Surgical History  Procedure Laterality Date  . Leg surgery    . Cervical cone biopsy    . Breast surgery    . Tonsillectomy    . Leg surgery      left  . Colonoscopy  2006    Dr. Anthony Sar: normal colonoscopy   . Open reduction internal fixation (orif) hand      right hand and arm  . Dilation and curettage of uterus    . Colonoscopy with propofol N/A 10/06/2012    Procedure: COLONOSCOPY WITH PROPOFOL;  Surgeon: Daneil Dolin, MD;  Location: [facility];  Service: Endoscopy;  Laterality: N/A;  entered cecum @ 0905; total cecal withdrawal  time=  50minutes  . Polypectomy N/A 10/06/2012    Procedure: POLYPECTOMY;  Surgeon: Daneil Dolin, MD;  Location: [facility];  Service: Endoscopy;  Laterality: N/A;  hepatic flexure polyp   Family History  Problem Relation Age of Onset  . Diabetes Mother   . Diabetes Sister   . Cancer Brother     "all over"   . Cancer Other   . Diabetes Other   . Colon cancer      unsure   History  Substance Use Topics  . Smoking status: Former Smoker -- 5 years    Quit date: 03/25/1981  . Smokeless tobacco: Never Used  . Alcohol Use: No   OB History    Gravida Para Term Preterm AB TAB SAB Ectopic Multiple Living   3 2 2  1  1         Review of Systems  Constitutional: Negative for fever, activity change and appetite change.  HENT: Negative for congestion and rhinorrhea.   Respiratory: Negative for cough, chest tightness and shortness of breath.   Cardiovascular: Negative for chest pain.  Gastrointestinal: Negative for nausea, vomiting and abdominal pain.  Genitourinary: Negative  for dysuria and hematuria.  Musculoskeletal: Positive for myalgias and arthralgias. Negative for neck pain.  Skin: Negative for rash.  Neurological: Positive for weakness. Negative for dizziness, light-headedness and headaches.  A complete 10 system review of systems was obtained and all systems are negative except as noted in the HPI and PMH.      Allergies  Latex and Other  Home Medications   Prior to Admission medications   Medication Sig Start Date End Date Taking? Authorizing Provider  acetaminophen (TYLENOL) 500 MG tablet Take 1,000 mg by mouth every 6 (six) hours as needed for headache.    Historical Provider, MD  acidophilus (RISAQUAD) CAPS capsule Take 1 capsule by mouth daily.    Historical Provider, MD  Chlorphen-Pseudoephed-APAP (CORICIDIN D PO) Take 2 tablets by mouth every 4 (four) hours as needed (Congestion).     Historical Provider, MD  Cholecalciferol (VITAMIN D) 2000 UNITS CAPS Take 1 capsule  by mouth daily.    Historical Provider, MD  cyanocobalamin (,VITAMIN B-12,) 1000 MCG/ML injection Inject 1,000 mcg into the muscle every 30 (thirty) days.    Historical Provider, MD  diltiazem (TIAZAC) 300 MG 24 hr capsule Take 300 mg by mouth daily.    Historical Provider, MD  docusate sodium (COLACE) 100 MG capsule Take 100 mg by mouth daily.      Historical Provider, MD  DULoxetine (CYMBALTA) 60 MG capsule Take 60 mg by mouth daily.      Historical Provider, MD  fluticasone (FLONASE) 50 MCG/ACT nasal spray Place 2 sprays into both nostrils daily.    Historical Provider, MD  gabapentin (NEURONTIN) 300 MG capsule Take 300-600 mg by mouth 3 (three) times daily. Takes 300 mg three times daily and 600 mg at bedtime.    Historical Provider, MD  guaiFENesin (MUCINEX) 600 MG 12 hr tablet Take 600 mg by mouth 2 (two) times daily as needed for congestion.    Historical Provider, MD  HYDROcodone-acetaminophen (NORCO/VICODIN) 5-325 MG per tablet Take 1 tablet by mouth every 8 (eight) hours as needed for moderate pain.    Historical Provider, MD  HYDROcodone-acetaminophen (NORCO/VICODIN) 5-325 MG per tablet Take 1 tablet by mouth at bedtime.    Historical Provider, MD  lamoTRIgine (LAMICTAL) 25 MG tablet Take 25 mg by mouth 2 (two) times daily.      Historical Provider, MD  Linaclotide Rolan Lipa) 290 MCG CAPS Take 1 capsule by mouth daily. 30 minutes before breakfast. 10/13/12   Orvil Feil, NP  lisinopril (PRINIVIL,ZESTRIL) 40 MG tablet Take 40 mg by mouth daily.      Historical Provider, MD  loratadine (CLARITIN) 10 MG tablet Take 10 mg by mouth daily.    Historical Provider, MD  lovastatin (MEVACOR) 20 MG tablet Take 20 mg by mouth at bedtime.    Historical Provider, MD  meclizine (ANTIVERT) 50 MG tablet 1 po tid for dizziness. 05/10/14   Lenox Ahr, PA-C  nystatin-triamcinolone (MYCOLOG II) cream Apply 1 application topically 2 (two) times daily.    Historical Provider, MD  omeprazole (PRILOSEC) 20 MG  capsule Take 20 mg by mouth daily.      Historical Provider, MD  polycarbophil (FIBER-LAX) 625 MG tablet Take 625 mg by mouth daily as needed (constipation).    Historical Provider, MD  prochlorperazine (COMPAZINE) 10 MG tablet Take 10 mg by mouth every 8 (eight) hours as needed for nausea.     Historical Provider, MD  senna (SENOKOT) 8.6 MG TABS Take 2 tablets by mouth daily.  Historical Provider, MD  tolterodine (DETROL) 2 MG tablet Take 2 mg by mouth 2 (two) times daily.    Historical Provider, MD  warfarin (COUMADIN) 2.5 MG tablet Take 2.5-5 mg by mouth daily. Take 2 tablets on Monday and 1 tablet on all other days.    Historical Provider, MD   BP 112/50 mmHg  Pulse 67  Temp(Src) 94.2 F (34.6 C) (Rectal)  Resp 20  Ht 5\' 4"  (1.626 m)  Wt 265 lb (120.203 kg)  BMI 45.46 kg/m2  SpO2 100% Physical Exam  Constitutional: She is oriented to person, place, and time. She appears well-developed and well-nourished. No distress.  HENT:  Head: Normocephalic and atraumatic.  Mouth/Throat: Oropharynx is clear and moist. No oropharyngeal exudate.  Eyes: Conjunctivae and EOM are normal. Pupils are equal, round, and reactive to light.  Neck: Normal range of motion. Neck supple.  No C spine tenderness.  Cardiovascular: Normal rate, regular rhythm, normal heart sounds and intact distal pulses.   No murmur heard. Pulmonary/Chest: Effort normal and breath sounds normal. No respiratory distress.  Abdominal: Soft. There is no tenderness. There is no rebound and no guarding.  Musculoskeletal: Normal range of motion. She exhibits tenderness. She exhibits no edema.  TTP R anterior knee. Flexion and extension intact. No ligament laxity. L anterior shoulder pain.  Intact radial pulse. No T or L spine tenderness  Neurological: She is alert and oriented to person, place, and time. No cranial nerve deficit. She exhibits normal muscle tone. Coordination normal.  L facial droop.  L hemiparesis at baseline per  patient.  Skin: Skin is warm.  Psychiatric: She has a normal mood and affect. Her behavior is normal.  Nursing note and vitals reviewed.   ED Course  Procedures (including critical care time) Labs Review Labs Reviewed  URINALYSIS, ROUTINE W REFLEX MICROSCOPIC    Imaging Review Ct Head Wo Contrast  08/28/2014   CLINICAL DATA:  [place] from bed today  EXAM: CT HEAD WITHOUT CONTRAST  TECHNIQUE: Contiguous axial images were obtained from the base of the skull through the vertex without intravenous contrast.  COMPARISON:  CT 06/04/2012, MRI 05/10/2014  FINDINGS: Chronic right MCA infarct unchanged from prior studies. Chronic infarct right cerebellum unchanged.  Mild atrophy.  Negative for hydrocephalus.  Negative for acute hemorrhage. Negative for acute infarct or mass lesion.  Negative for skull fracture  IMPRESSION: Chronic ischemic change.  No acute abnormality.   Electronically Signed   By: Franchot Gallo M.D.   On: 08/28/2014 07:13   Dg Shoulder Left  08/28/2014   CLINICAL DATA:  Fall.  Left shoulder pain  EXAM: LEFT SHOULDER - 2+ VIEW  COMPARISON:  01/09/2012  FINDINGS: There is no evidence of fracture or dislocation. There is no evidence of arthropathy or other focal bone abnormality. Soft tissues are unremarkable.  IMPRESSION: Negative.   Electronically Signed   By: Franchot Gallo M.D.   On: 08/28/2014 07:24   Dg Knee Complete 4 Views Right  08/28/2014   CLINICAL DATA:  Pain following fall  EXAM: RIGHT KNEE - COMPLETE 4+ VIEW  COMPARISON:  November 27, 2012  FINDINGS: Frontal, lateral, and bilateral oblique views were obtained. There is no fracture or dislocation. There is no effusion. There are, however, multiple areas of ossification in the suprapatellar bursa region. There is severe narrowing of the patellofemoral joint. There is moderate narrowing medially. There is spurring in all compartments. No erosive change.  IMPRESSION: Osteoarthritic change. Areas of calcifications/ ossification in the  suprapatellar bursa. This finding could be residua of old trauma or represent synovial chondromatosis. This finding is stable. 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   Electronically Signed   By: Lowella Grip M.D.   On: 08/28/2014 07:23     EKG Interpretation None      MDM   Final diagnoses:  Fall  Right knee pain   Fall from bed with pain to right knee and left shoulder. Uncertain if she hit head. She is on Coumadin. Denies any chest pain or shortness of breath. Left-sided weakness at baseline.  CT head negative.  Xray R knee and L shoulder negative. Pain has improved.  L sided weakness at baseline.  No temperature obtained in triage.  Rectal temp pending, UA pending.  Dr. Wilson Singer to assume care at shift change.  Ezequiel Essex, MD 08/28/14 [phone]

## 2014-08-28 NOTE — Discharge Instructions (Signed)
Fall Prevention and Home Safety Your tests are negative for any serious injury. Follow up with your doctor. Return to the ED if you develop new or worsening symptoms. Falls cause injuries and can affect all age groups. It is possible to use preventive measures to significantly decrease the likelihood of falls. There are many simple measures which can make your home safer and prevent falls. OUTDOORS  Repair cracks and edges of walkways and driveways.  Remove high doorway thresholds.  Trim shrubbery on the main path into your home.  Have good outside lighting.  Clear walkways of tools, rocks, debris, and clutter.  Check that handrails are not broken and are securely fastened. Both sides of steps should have handrails.  Have leaves, snow, and ice cleared regularly.  Use sand or salt on walkways during winter months.  In the garage, clean up grease or oil spills. BATHROOM  Install night lights.  Install grab bars by the toilet and in the tub and shower.  Use non-skid mats or decals in the tub or shower.  Place a plastic non-slip stool in the shower to sit on, if needed.  Keep floors dry and clean up all water on the floor immediately.  Remove soap buildup in the tub or shower on a regular basis.  Secure bath mats with non-slip, double-sided rug tape.  Remove throw rugs and tripping hazards from the floors. BEDROOMS  Install night lights.  Make sure a bedside light is easy to reach.  Do not use oversized bedding.  Keep a telephone by your bedside.  Have a firm chair with side arms to use for getting dressed.  Remove throw rugs and tripping hazards from the floor. KITCHEN  Keep handles on pots and pans turned toward the center of the stove. Use back burners when possible.  Clean up spills quickly and allow time for drying.  Avoid walking on wet floors.  Avoid hot utensils and knives.  Position shelves so they are not too high or low.  Place commonly used  objects within easy reach.  If necessary, use a sturdy step stool with a grab bar when reaching.  Keep electrical cables out of the way.  Do not use floor polish or wax that makes floors slippery. If you must use wax, use non-skid floor wax.  Remove throw rugs and tripping hazards from the floor. STAIRWAYS  Never leave objects on stairs.  Place handrails on both sides of stairways and use them. Fix any loose handrails. Make sure handrails on both sides of the stairways are as long as the stairs.  Check carpeting to make sure it is firmly attached along stairs. Make repairs to worn or loose carpet promptly.  Avoid placing throw rugs at the top or bottom of stairways, or properly secure the rug with carpet tape to prevent slippage. Get rid of throw rugs, if possible.  Have an electrician put in a light switch at the top and bottom of the stairs. OTHER FALL PREVENTION TIPS  Wear low-heel or rubber-soled shoes that are supportive and fit well. Wear closed toe shoes.  When using a stepladder, make sure it is fully opened and both spreaders are firmly locked. Do not climb a closed stepladder.  Add color or contrast paint or tape to grab bars and handrails in your home. Place contrasting color strips on first and last steps.  Learn and use mobility aids as needed. Install an electrical emergency response system.  Turn on lights to avoid dark areas. Replace  light bulbs that burn out immediately. Get light switches that glow.  Arrange furniture to create clear pathways. Keep furniture in the same place.  Firmly attach carpet with non-skid or double-sided tape.  Eliminate uneven floor surfaces.  Select a carpet pattern that does not visually hide the edge of steps.  Be aware of all pets. OTHER HOME SAFETY TIPS  Set the water temperature for 120 F (48.8 C).  Keep emergency numbers on or near the telephone.  Keep smoke detectors on every level of the home and near sleeping  areas. Document Released: 07/17/2002 Document Revised: 01/26/2012 Document Reviewed: 10/16/2011 [facility] Patient Information 2015 [place], [place]. This information is not intended to replace advice given to you by your health care provider. Make sure you discuss any questions you have with your health care provider.

## 2014-08-28 NOTE — ED Notes (Signed)
Nurse called [facility] to bring pt back. Per Bethena Roys, their driver is out and she is unsure when they would be able to pick up pt. EMS called to transport pt

## 2014-08-28 NOTE — ED Notes (Signed)
Patient fell this morning when getting out of bed. Complaining of pain to right knee. Patient complained of left arm pain to EMS. Denies LOC.

## 2014-10-08 ENCOUNTER — Ambulatory Visit (INDEPENDENT_AMBULATORY_CARE_PROVIDER_SITE_OTHER): Payer: Medicare Other | Admitting: *Deleted

## 2014-10-08 DIAGNOSIS — I4891 Unspecified atrial fibrillation: Secondary | ICD-10-CM

## 2014-10-08 DIAGNOSIS — I2699 Other pulmonary embolism without acute cor pulmonale: Secondary | ICD-10-CM

## 2014-10-08 DIAGNOSIS — Z5181 Encounter for therapeutic drug level monitoring: Secondary | ICD-10-CM

## 2014-10-08 LAB — POCT INR: INR: 2.7

## 2014-11-19 ENCOUNTER — Ambulatory Visit (INDEPENDENT_AMBULATORY_CARE_PROVIDER_SITE_OTHER): Payer: Medicare Other | Admitting: *Deleted

## 2014-11-19 DIAGNOSIS — I2699 Other pulmonary embolism without acute cor pulmonale: Secondary | ICD-10-CM

## 2014-11-19 DIAGNOSIS — Z5181 Encounter for therapeutic drug level monitoring: Secondary | ICD-10-CM | POA: Diagnosis not present

## 2014-11-19 DIAGNOSIS — I4891 Unspecified atrial fibrillation: Secondary | ICD-10-CM

## 2014-11-19 LAB — POCT INR: INR: 2.9

## 2014-12-31 ENCOUNTER — Ambulatory Visit (INDEPENDENT_AMBULATORY_CARE_PROVIDER_SITE_OTHER): Payer: Medicare Other | Admitting: *Deleted

## 2014-12-31 DIAGNOSIS — Z5181 Encounter for therapeutic drug level monitoring: Secondary | ICD-10-CM

## 2014-12-31 DIAGNOSIS — I2699 Other pulmonary embolism without acute cor pulmonale: Secondary | ICD-10-CM | POA: Diagnosis not present

## 2014-12-31 DIAGNOSIS — I4891 Unspecified atrial fibrillation: Secondary | ICD-10-CM | POA: Diagnosis not present

## 2014-12-31 LAB — POCT INR: INR: 3

## 2015-01-01 ENCOUNTER — Encounter: Payer: Self-pay | Admitting: *Deleted

## 2015-01-08 ENCOUNTER — Ambulatory Visit (INDEPENDENT_AMBULATORY_CARE_PROVIDER_SITE_OTHER): Payer: Medicare Other | Admitting: Obstetrics & Gynecology

## 2015-01-08 ENCOUNTER — Encounter: Payer: Self-pay | Admitting: Obstetrics & Gynecology

## 2015-01-08 VITALS — BP 110/60 | HR 88 | Wt 261.0 lb

## 2015-01-08 DIAGNOSIS — R102 Pelvic and perineal pain: Secondary | ICD-10-CM | POA: Diagnosis not present

## 2015-01-08 DIAGNOSIS — K5909 Other constipation: Secondary | ICD-10-CM

## 2015-01-08 DIAGNOSIS — K5900 Constipation, unspecified: Secondary | ICD-10-CM | POA: Diagnosis not present

## 2015-01-08 MED ORDER — POLYETHYLENE GLYCOL 3350 17 GM/SCOOP PO POWD: ORAL | Status: DC

## 2015-01-08 NOTE — Progress Notes (Signed)
Patient ID: Deborah Maddox, female   DOB: Jan 22, 1956, 59 y.o.   MRN: [id number]   Chief Complaint  Patient presents with  . Referral    pelvic and perineal pain.     HPI:    59 y.o. G3P2010 No LMP recorded. Patient is postmenopausal.  Patient had suffered a stroke in her 43s and is currently a long-term care patient at a facility in [facility] She has suffered with constipation now for quite some time She has been on Linzess since March 2014 She also takes Dulcolax and Colace However she is also A couple of meds that would calls constipation and of course given her limited mobility from stroke and poor general health also increases her risk of constipation She states her pain is when she tries to have a bowel movement and has to strain She also has difficulty with hemorrhoids flaring Location:  Vaginal rectal. Quality:  sharp. Severity:  episodic. Timing:  With BM. Duration:  Minutes to hours. Context:  BM. Modifying factors:   Signs/Symptoms:      Current outpatient prescriptions:  .  acidophilus (RISAQUAD) CAPS capsule, Take 1 capsule by mouth daily., Disp: , Rfl:  .  Chlorphen-Pseudoephed-APAP (CORICIDIN D PO), Take 2 tablets by mouth every 4 (four) hours as needed (Congestion). , Disp: , Rfl:  .  Cholecalciferol (VITAMIN D) 2000 UNITS CAPS, Take 1 capsule by mouth daily., Disp: , Rfl:  .  cyanocobalamin (,VITAMIN B-12,) 1000 MCG/ML injection, Inject 1,000 mcg into the muscle every 30 (thirty) days., Disp: , Rfl:  .  diltiazem (TIAZAC) 300 MG 24 hr capsule, Take 300 mg by mouth daily., Disp: , Rfl:  .  docusate sodium (COLACE) 100 MG capsule, Take 100 mg by mouth daily.  , Disp: , Rfl:  .  DULoxetine (CYMBALTA) 60 MG capsule, Take 60 mg by mouth daily.  , Disp: , Rfl:  .  fluticasone (FLONASE) 50 MCG/ACT nasal spray, Place 2 sprays into both nostrils daily., Disp: , Rfl:  .  gabapentin (NEURONTIN) 300 MG capsule, Take 300-600 mg by mouth 3 (three) times daily. Takes 300 mg  three times daily and 600 mg at bedtime., Disp: , Rfl:  .  HYDROcodone-acetaminophen (NORCO/VICODIN) 5-325 MG per tablet, Take 1-2 tablets by mouth See admin instructions. Take 1 tablet at 08:00 AM, 1 tablet at 02:00 PM, and take 2 tablets at bedtime., Disp: , Rfl:  .  lamoTRIgine (LAMICTAL) 100 MG tablet, Take 100 mg by mouth 2 (two) times daily., Disp: , Rfl:  .  Linaclotide (LINZESS) 290 MCG CAPS, Take 1 capsule by mouth daily. 30 minutes before breakfast., Disp: 30 capsule, Rfl: 3 .  lisinopril (PRINIVIL,ZESTRIL) 40 MG tablet, Take 40 mg by mouth daily.  , Disp: , Rfl:  .  loratadine (CLARITIN) 10 MG tablet, Take 10 mg by mouth daily., Disp: , Rfl:  .  lovastatin (MEVACOR) 20 MG tablet, Take 20 mg by mouth at bedtime., Disp: , Rfl:  .  meclizine (ANTIVERT) 50 MG tablet, 1 po tid for dizziness., Disp: 30 tablet, Rfl: 0 .  omeprazole (PRILOSEC) 20 MG capsule, Take 20 mg by mouth daily.  , Disp: , Rfl:  .  polycarbophil (FIBER-LAX) 625 MG tablet, Take 625 mg by mouth daily as needed (constipation)., Disp: , Rfl:  .  prochlorperazine (COMPAZINE) 10 MG tablet, Take 10 mg by mouth every 8 (eight) hours as needed for nausea. , Disp: , Rfl:  .  senna (SENOKOT) 8.6 MG TABS, Take 2 tablets  by mouth daily., Disp: , Rfl:  .  tolterodine (DETROL) 2 MG tablet, Take 2 mg by mouth 2 (two) times daily., Disp: , Rfl:  .  warfarin (COUMADIN) 2.5 MG tablet, Take 2.5-5 mg by mouth daily. Take 2 tablets on Monday and 1 tablet on all other days., Disp: , Rfl:  .  acetaminophen (TYLENOL) 500 MG tablet, Take 1,000 mg by mouth every 6 (six) hours as needed for headache., Disp: , Rfl:  .  guaiFENesin (MUCINEX) 600 MG 12 hr tablet, Take 600 mg by mouth 2 (two) times daily as needed for congestion., Disp: , Rfl:   Problem Pertinent ROS:       No burning with urination, frequency or urgency No nausea, vomiting or diarrhea Nor fever chills or other constitutional symptoms   Extended ROS:        [place]:              Past Medical History  Diagnosis Date  . Coronary artery disease   . Hypertension   . Arthritis   . Sleep apnea   . Cancer     cervical  . Stroke     at age 31, left sided weakness  . PONV (postoperative nausea and vomiting)   . Anxiety   . Seizures     last one 10 years ago  . Foot drop     left foot  . Paralysis     left arm  . 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 Left foot drop     Past Surgical History  Procedure Laterality Date  . Leg surgery    . Cervical cone biopsy    . Breast surgery    . Tonsillectomy    . Leg surgery      left  . Colonoscopy  2006    Dr. Anthony Sar: normal colonoscopy   . Open reduction internal fixation (orif) hand      right hand and arm  . Dilation and curettage of uterus    . Colonoscopy with propofol N/A 10/06/2012    Procedure: COLONOSCOPY WITH PROPOFOL;  Surgeon: Daneil Dolin, MD;  Location: [facility];  Service: Endoscopy;  Laterality: N/A;  entered cecum @ 0905; total cecal withdrawal time=  27minutes  . Polypectomy N/A 10/06/2012    Procedure: POLYPECTOMY;  Surgeon: Daneil Dolin, MD;  Location: [facility];  Service: Endoscopy;  Laterality: N/A;  hepatic flexure polyp    OB History    Gravida Para Term Preterm AB TAB SAB Ectopic Multiple Living   3 2 2  1  1          Allergies  Allergen Reactions  . Latex Rash  . Other Rash    HEART  MONITOR TAPE: severe rash, bleeding    History   Social History  . Marital Status: Single    Spouse Name: N/A  . Number of Children: N/A  . Years of Education: N/A   Social History Main Topics  . Smoking status: Former Smoker -- 5 years    Quit date: 03/25/1981  . Smokeless tobacco: Never Used  . Alcohol Use: No  . Drug Use: No  . Sexual Activity: No   Other Topics Concern  . None   Social History Narrative    Family History  Problem Relation Age of Onset  . Diabetes Mother   . Diabetes Sister   . Cancer Brother     "all over"   .  Cancer Other    . Diabetes Other   . Colon cancer      unsure     Examination:  Vitals:  Blood pressure 110/60, pulse 88, weight 261 lb (118.389 kg).    Physical Examination:     General Appearance:  awake, alert, oriented, in no acute distress  Vulva:  normal appearing vulva with no masses, tenderness or lesions Vagina:  normal mucosa, no discharge, large amount of stoll in vault causing distortion, but not impacted Cervix:  absent Uterus:  uterus absent Adnexa: ovaries:not present,       DATA orders and reviews: Labs were not ordered today:   Imaging studies were not ordered today:    Lab tests were not reviewed today:    Imaging studies were not reviewed today:    I did not independently review/view images, tracing or specimen(not simply the report) myself.  Prescription Drug Management:  New Prescriptions: Miralaz powder 1 to 4 scoops daily for  Renewed Prescriptions:   Current prescription changes:     Impression/Plan(Problem Based): 1.  Chronic constipation      (new problem) : Additional workup is not needed:  Miralax powder  {2.  CVA        {3.   Vaginal Pain due to #1      (new problem:   Follow Up:   prn

## 2015-02-13 ENCOUNTER — Ambulatory Visit (INDEPENDENT_AMBULATORY_CARE_PROVIDER_SITE_OTHER): Payer: Medicare Other | Admitting: *Deleted

## 2015-02-13 DIAGNOSIS — I2699 Other pulmonary embolism without acute cor pulmonale: Secondary | ICD-10-CM | POA: Diagnosis not present

## 2015-02-13 DIAGNOSIS — I4891 Unspecified atrial fibrillation: Secondary | ICD-10-CM

## 2015-02-13 DIAGNOSIS — Z5181 Encounter for therapeutic drug level monitoring: Secondary | ICD-10-CM | POA: Diagnosis not present

## 2015-02-13 LAB — POCT INR: INR: 2.6

## 2015-03-27 ENCOUNTER — Ambulatory Visit (INDEPENDENT_AMBULATORY_CARE_PROVIDER_SITE_OTHER): Payer: Medicare Other | Admitting: *Deleted

## 2015-03-27 DIAGNOSIS — I2699 Other pulmonary embolism without acute cor pulmonale: Secondary | ICD-10-CM | POA: Diagnosis not present

## 2015-03-27 DIAGNOSIS — I4891 Unspecified atrial fibrillation: Secondary | ICD-10-CM

## 2015-03-27 DIAGNOSIS — Z5181 Encounter for therapeutic drug level monitoring: Secondary | ICD-10-CM | POA: Diagnosis not present

## 2015-03-27 LAB — POCT INR: INR: 2.7

## 2015-03-29 ENCOUNTER — Other Ambulatory Visit: Payer: Self-pay | Admitting: Cardiology

## 2015-05-08 ENCOUNTER — Ambulatory Visit (INDEPENDENT_AMBULATORY_CARE_PROVIDER_SITE_OTHER): Payer: Medicare Other | Admitting: *Deleted

## 2015-05-08 DIAGNOSIS — I4891 Unspecified atrial fibrillation: Secondary | ICD-10-CM | POA: Diagnosis not present

## 2015-05-08 DIAGNOSIS — Z5181 Encounter for therapeutic drug level monitoring: Secondary | ICD-10-CM | POA: Diagnosis not present

## 2015-05-08 DIAGNOSIS — I2699 Other pulmonary embolism without acute cor pulmonale: Secondary | ICD-10-CM | POA: Diagnosis not present

## 2015-05-08 LAB — POCT INR: INR: 2.6

## 2015-05-31 ENCOUNTER — Emergency Department (HOSPITAL_COMMUNITY): Payer: Medicare Other

## 2015-05-31 ENCOUNTER — Emergency Department (HOSPITAL_COMMUNITY)
Admission: EM | Admit: 2015-05-31 | Discharge: 2015-05-31 | Disposition: A | Discharge status: Home / Self Care | Payer: Medicare Other | Attending: Emergency Medicine | Admitting: Emergency Medicine

## 2015-05-31 ENCOUNTER — Encounter (HOSPITAL_COMMUNITY): Payer: Self-pay | Admitting: *Deleted

## 2015-05-31 DIAGNOSIS — R42 Dizziness and giddiness: Secondary | ICD-10-CM | POA: Insufficient documentation

## 2015-05-31 DIAGNOSIS — R531 Weakness: Secondary | ICD-10-CM

## 2015-05-31 DIAGNOSIS — I10 Essential (primary) hypertension: Secondary | ICD-10-CM | POA: Diagnosis not present

## 2015-05-31 DIAGNOSIS — Q249 Congenital malformation of heart, unspecified: Secondary | ICD-10-CM | POA: Diagnosis not present

## 2015-05-31 DIAGNOSIS — I2510 Atherosclerotic heart disease of native coronary artery without angina pectoris: Secondary | ICD-10-CM | POA: Diagnosis not present

## 2015-05-31 DIAGNOSIS — M1990 Unspecified osteoarthritis, unspecified site: Secondary | ICD-10-CM | POA: Insufficient documentation

## 2015-05-31 DIAGNOSIS — Z7901 Long term (current) use of anticoagulants: Secondary | ICD-10-CM | POA: Insufficient documentation

## 2015-05-31 DIAGNOSIS — Z87891 Personal history of nicotine dependence: Secondary | ICD-10-CM | POA: Diagnosis not present

## 2015-05-31 DIAGNOSIS — G40909 Epilepsy, unspecified, not intractable, without status epilepticus: Secondary | ICD-10-CM | POA: Insufficient documentation

## 2015-05-31 DIAGNOSIS — Z8673 Personal history of transient ischemic attack (TIA), and cerebral infarction without residual deficits: Secondary | ICD-10-CM | POA: Insufficient documentation

## 2015-05-31 DIAGNOSIS — Z8541 Personal history of malignant neoplasm of cervix uteri: Secondary | ICD-10-CM | POA: Insufficient documentation

## 2015-05-31 DIAGNOSIS — F419 Anxiety disorder, unspecified: Secondary | ICD-10-CM | POA: Insufficient documentation

## 2015-05-31 DIAGNOSIS — Z7951 Long term (current) use of inhaled steroids: Secondary | ICD-10-CM | POA: Diagnosis not present

## 2015-05-31 DIAGNOSIS — Z79899 Other long term (current) drug therapy: Secondary | ICD-10-CM | POA: Insufficient documentation

## 2015-05-31 LAB — COMPREHENSIVE METABOLIC PANEL
ALBUMIN: 4 g/dL (ref 3.5–5.0)
ALT: 13 U/L — ABNORMAL LOW (ref 14–54)
ANION GAP: 7 (ref 5–15)
AST: 15 U/L (ref 15–41)
Alkaline Phosphatase: 70 U/L (ref 38–126)
BUN: 13 mg/dL (ref 6–20)
CO2: 29 mmol/L (ref 22–32)
Calcium: 9.2 mg/dL (ref 8.9–10.3)
Chloride: 106 mmol/L (ref 101–111)
Creatinine, Ser: 0.69 mg/dL (ref 0.44–1.00)
GFR calc Af Amer: >60 mL/min (ref >60)
GFR calc non Af Amer: >60 mL/min (ref >60)
GLUCOSE: 117 mg/dL — AB (ref 65–99)
POTASSIUM: 3.7 mmol/L (ref 3.5–5.1)
SODIUM: 142 mmol/L (ref 135–145)
Total Bilirubin: 0.6 mg/dL (ref 0.3–1.2)
Total Protein: 7.2 g/dL (ref 6.5–8.1)

## 2015-05-31 LAB — CBC WITH DIFFERENTIAL/PLATELET
BASOS ABS: 0 10*3/uL (ref 0.0–0.1)
Basophils Relative: 0 %
Eosinophils Absolute: 0.3 10*3/uL (ref 0.0–0.7)
Eosinophils Relative: 4 %
HEMATOCRIT: 37.4 % (ref 36.0–46.0)
HEMOGLOBIN: 12.2 g/dL (ref 12.0–15.0)
LYMPHS PCT: 49 %
Lymphs Abs: 3.5 10*3/uL (ref 0.7–4.0)
MCH: 31.2 pg (ref 26.0–34.0)
MCHC: 32.6 g/dL (ref 30.0–36.0)
MCV: 95.7 fL (ref 78.0–100.0)
MONO ABS: 1 10*3/uL (ref 0.1–1.0)
MONOS PCT: 14 %
NEUTROS ABS: 2.4 10*3/uL (ref 1.7–7.7)
Neutrophils Relative %: 33 %
Platelets: 287 10*3/uL (ref 150–400)
RBC: 3.91 MIL/uL (ref 3.87–5.11)
RDW: 13.4 % (ref 11.5–15.5)
WBC: 7.2 10*3/uL (ref 4.0–10.5)

## 2015-05-31 LAB — URINALYSIS, ROUTINE W REFLEX MICROSCOPIC
Bilirubin Urine: NEGATIVE
Glucose, UA: NEGATIVE mg/dL
HGB URINE DIPSTICK: NEGATIVE
Ketones, ur: NEGATIVE mg/dL
LEUKOCYTES UA: NEGATIVE
Nitrite: NEGATIVE
Protein, ur: NEGATIVE mg/dL
UROBILINOGEN UA: 0.2 mg/dL (ref 0.0–1.0)
pH: 6.5 (ref 5.0–8.0)

## 2015-05-31 LAB — PROTIME-INR
INR: 2.04 — ABNORMAL HIGH (ref 0.00–1.49)
Prothrombin Time: 22.9 seconds — ABNORMAL HIGH (ref 11.6–15.2)

## 2015-05-31 LAB — I-STAT CG4 LACTIC ACID, ED: LACTIC ACID, VENOUS: 0.97 mmol/L (ref 0.5–2.0)

## 2015-05-31 LAB — TROPONIN I: Troponin I: <0.03 ng/mL (ref <0.031)

## 2015-05-31 NOTE — ED Provider Notes (Addendum)
CSN: [id number]     Arrival date & time 05/31/15  0046 History   First MD Initiated Contact with Patient 05/31/15 0111     Chief Complaint  Patient presents with  . Weakness     (Consider location/radiation/quality/duration/timing/severity/associated sxs/prior Treatment) Patient is a 59 y.o. female presenting with weakness. The history is provided by the patient.  Weakness  She states that she woke up about one hour ago and felt funny. She describes it like half of her was there and half of her wasn't. She has difficulty describing her complaint any further. She denies any headache or vision change. She denies chest pain, heaviness, tightness, pressure. She is feeling somewhat lightheaded. There is no nausea or vomiting. She denies vertigo. She denies arthralgias or myalgias. She denies fever, chills, sweats. She states this happened once before but she does not recall what diagnosis was given. 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  Past Medical History  Diagnosis Date  . Coronary artery disease   . Hypertension   . Arthritis   . Sleep apnea   . Cancer ([place])     cervical  . Stroke ([place])     at age 25, left sided weakness  . PONV (postoperative nausea and vomiting)   . Anxiety   . Seizures ([place])     last one 10 years ago  . Foot drop     left foot  . 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 Left foot drop    Past Surgical History  Procedure Laterality Date  . Leg surgery    . Cervical cone biopsy    . Breast surgery    . Tonsillectomy    . Leg surgery      left  . Colonoscopy  2006    Dr. Anthony Sar: normal colonoscopy   . Open reduction internal fixation (orif) hand      right hand and arm  . Dilation and curettage of uterus    . Colonoscopy with propofol N/A 10/06/2012    Procedure: COLONOSCOPY WITH PROPOFOL;  Surgeon: Daneil Dolin, MD;  Location: [facility];  Service:  Endoscopy;  Laterality: N/A;  entered cecum @ 0905; total cecal withdrawal time=  36minutes  . Polypectomy N/A 10/06/2012    Procedure: POLYPECTOMY;  Surgeon: Daneil Dolin, MD;  Location: [facility];  Service: Endoscopy;  Laterality: N/A;  hepatic flexure polyp   Family History  Problem Relation Age of Onset  . Diabetes Mother   . Diabetes Sister   . Cancer Brother     "all over"   . Cancer Other   . Diabetes Other   . Colon cancer      unsure   Social History  Substance Use Topics  . Smoking status: Former Smoker -- 5 years    Quit date: 03/25/1981  . Smokeless tobacco: Never Used  . Alcohol Use: No   OB History    Gravida Para Term Preterm AB TAB SAB Ectopic Multiple Living   3 2 2  1  1         Review of Systems  Neurological: Positive for weakness.  All other systems reviewed and are negative.     Allergies  Latex and Other  Home Medications   Prior to Admission medications   Medication Sig Start Date End Date Taking? Authorizing Provider  acetaminophen (  TYLENOL) 500 MG tablet Take 1,000 mg by mouth every 6 (six) hours as needed for headache.    Historical Provider, MD  acidophilus (RISAQUAD) CAPS capsule Take 1 capsule by mouth daily.    Historical Provider, MD  Chlorphen-Pseudoephed-APAP (CORICIDIN D PO) Take 2 tablets by mouth every 4 (four) hours as needed (Congestion).     Historical Provider, MD  Cholecalciferol (VITAMIN D) 2000 UNITS CAPS Take 1 capsule by mouth daily.    Historical Provider, MD  cyanocobalamin (,VITAMIN B-12,) 1000 MCG/ML injection Inject 1,000 mcg into the muscle every 30 (thirty) days.    Historical Provider, MD  diltiazem (TIAZAC) 300 MG 24 hr capsule Take 300 mg by mouth daily.    Historical Provider, MD  docusate sodium (COLACE) 100 MG capsule Take 100 mg by mouth daily.      Historical Provider, MD  DULoxetine (CYMBALTA) 60 MG capsule Take 60 mg by mouth daily.      Historical Provider, MD  fluticasone (FLONASE) 50 MCG/ACT nasal spray  Place 2 sprays into both nostrils daily.    Historical Provider, MD  gabapentin (NEURONTIN) 300 MG capsule Take 300-600 mg by mouth 3 (three) times daily. Takes 300 mg three times daily and 600 mg at bedtime.    Historical Provider, MD  guaiFENesin (MUCINEX) 600 MG 12 hr tablet Take 600 mg by mouth 2 (two) times daily as needed for congestion.    Historical Provider, MD  HYDROcodone-acetaminophen (NORCO/VICODIN) 5-325 MG per tablet Take 1-2 tablets by mouth See admin instructions. Take 1 tablet at 08:00 AM, 1 tablet at 02:00 PM, and take 2 tablets at bedtime.    Historical Provider, MD  lamoTRIgine (LAMICTAL) 100 MG tablet Take 100 mg by mouth 2 (two) times daily.    Historical Provider, MD  Linaclotide Rolan Lipa) 290 MCG CAPS Take 1 capsule by mouth daily. 30 minutes before breakfast. 10/13/12   Orvil Feil, NP  lisinopril (PRINIVIL,ZESTRIL) 40 MG tablet Take 40 mg by mouth daily.      Historical Provider, MD  loratadine (CLARITIN) 10 MG tablet Take 10 mg by mouth daily.    Historical Provider, MD  lovastatin (MEVACOR) 20 MG tablet Take 20 mg by mouth at bedtime.    Historical Provider, MD  meclizine (ANTIVERT) 50 MG tablet 1 po tid for dizziness. 05/10/14   Lily Kocher, PA-C  omeprazole (PRILOSEC) 20 MG capsule Take 20 mg by mouth daily.      Historical Provider, MD  polycarbophil (FIBER-LAX) 625 MG tablet Take 625 mg by mouth daily as needed (constipation).    Historical Provider, MD  polyethylene glycol powder (MIRALAX) powder 1 scoop 1 to 4 times daily 01/08/15   Florian Buff, MD  prochlorperazine (COMPAZINE) 10 MG tablet Take 10 mg by mouth every 8 (eight) hours as needed for nausea.     Historical Provider, MD  senna (SENOKOT) 8.6 MG TABS Take 2 tablets by mouth daily.    Historical Provider, MD  tolterodine (DETROL) 2 MG tablet Take 2 mg by mouth 2 (two) times daily.    Historical Provider, MD  warfarin (COUMADIN) 2.5 MG tablet TAKE 2 TABLETS (5mg ) BY MOUTH ON MONDAY. TAKE 1 TABLET (2.5mg ) ONCE  DAILY ON ALL OTHER DAYS. 04/01/15   Satira Sark, MD   BP 125/78 mmHg  Pulse 68  Temp(Src) 95.1 F (35.1 C) (Rectal)  Resp 18  Wt 260 lb (117.935 kg)  SpO2 97% Physical Exam  Nursing note and vitals reviewed.  59 year old  female, resting comfortably and in no acute distress. Vital signs are normal. Oxygen saturation is 97%, which is normal. Head is normocephalic and atraumatic. PERRLA, EOMI. Oropharynx is clear. Fundi show no hemorrhage, exudate, or papilledema. Neck is nontender and supple without adenopathy or JVD. There are no carotid bruits. Back is nontender and there is no CVA tenderness. Lungs are clear without rales, wheezes, or rhonchi. Chest is nontender. Heart has regular rate and rhythm without murmur. Abdomen is soft, flat, nontender without masses or hepatosplenomegaly and peristalsis is normoactive. Extremities have no cyanosis or edema, full range of motion is present. Skin is warm and dry without rash. Neurologic: Mental status is normal, cranial nerves are intact. There is a dense left hemiparesis.  ED Course  Procedures (including critical care time) Labs Review Results for orders placed or performed during the hospital encounter of 05/31/15  Comprehensive metabolic panel  Result Value Ref Range   Sodium 142 135 - 145 mmol/L   Potassium 3.7 3.5 - 5.1 mmol/L   Chloride 106 101 - 111 mmol/L   CO2 29 22 - 32 mmol/L   Glucose, Bld 117 (H) 65 - 99 mg/dL   BUN 13 6 - 20 mg/dL   Creatinine, Ser 0.69 0.44 - 1.00 mg/dL   Calcium 9.2 8.9 - 10.3 mg/dL   Total Protein 7.2 6.5 - 8.1 g/dL   Albumin 4.0 3.5 - 5.0 g/dL   AST 15 15 - 41 U/L   ALT 13 (L) 14 - 54 U/L   Alkaline Phosphatase 70 38 - 126 U/L   Total Bilirubin 0.6 0.3 - 1.2 mg/dL   GFR calc non Af Amer >60 >60 mL/min   GFR calc Af Amer >60 >60 mL/min   Anion gap 7 5 - 15  CBC with Differential  Result Value Ref Range   WBC 7.2 4.0 - 10.5 K/uL   RBC 3.91 3.87 - 5.11 MIL/uL   Hemoglobin 12.2 12.0 -  15.0 g/dL   HCT 37.4 36.0 - 46.0 %   MCV 95.7 78.0 - 100.0 fL   MCH 31.2 26.0 - 34.0 pg   MCHC 32.6 30.0 - 36.0 g/dL   RDW 13.4 11.5 - 15.5 %   Platelets 287 150 - 400 K/uL   Neutrophils Relative % 33 %   Neutro Abs 2.4 1.7 - 7.7 K/uL   Lymphocytes Relative 49 %   Lymphs Abs 3.5 0.7 - 4.0 K/uL   Monocytes Relative 14 %   Monocytes Absolute 1.0 0.1 - 1.0 K/uL   Eosinophils Relative 4 %   Eosinophils Absolute 0.3 0.0 - 0.7 K/uL   Basophils Relative 0 %   Basophils Absolute 0.0 0.0 - 0.1 K/uL  Troponin I  Result Value Ref Range   Troponin I <0.03 <0.031 ng/mL  Urinalysis, Routine w reflex microscopic  Result Value Ref Range   Color, Urine YELLOW YELLOW   APPearance CLEAR CLEAR   Specific Gravity, Urine <1.005 (L) 1.005 - 1.030   pH 6.5 5.0 - 8.0   Glucose, UA NEGATIVE NEGATIVE mg/dL   Hgb urine dipstick NEGATIVE NEGATIVE   Bilirubin Urine NEGATIVE NEGATIVE   Ketones, ur NEGATIVE NEGATIVE mg/dL   Protein, ur NEGATIVE NEGATIVE mg/dL   Urobilinogen, UA 0.2 0.0 - 1.0 mg/dL   Nitrite NEGATIVE NEGATIVE   Leukocytes, UA NEGATIVE NEGATIVE  Protime-INR  Result Value Ref Range   Prothrombin Time 22.9 (H) 11.6 - 15.2 seconds   INR 2.04 (H) 0.00 - 1.49  I-Stat CG4 Lactic Acid, ED  Result Value Ref Range   Lactic Acid, Venous 0.97 0.5 - 2.0 mmol/L    Imaging Review Dg Chest 1 View  05/31/2015  CLINICAL DATA:  Generalized weakness. EXAM: CHEST 1 VIEW COMPARISON:  Radiographs and CT 03/01/2013 FINDINGS: Lower lung volumes from prior exam. Cardiomegaly is unchanged. There is no pulmonary edema, confluent airspace disease, pleural effusion or pneumothorax. Degenerative change in the right shoulder. IMPRESSION: Stable cardiomegaly.  No acute pulmonary process. Electronically Signed   By: Jeb Levering M.D.   On: 05/31/2015 01:57   Ct Head Wo Contrast  05/31/2015  CLINICAL DATA:  Weakness and altered level of consciousness. EXAM: CT HEAD WITHOUT CONTRAST TECHNIQUE: Contiguous axial  images were obtained from the base of the skull through the vertex without intravenous contrast. COMPARISON:  Head CT 08/28/2014 FINDINGS: Chronic right MCA distribution infarct with encephalomalacia, unchanged in appearance from prior exam. Chronic right cerebellar infarcts, also unchanged. No acute ischemia. Mild atrophy is stable. No intracranial hemorrhage, mass effect, or midline shift. No hydrocephalus, mild ex vacuo dilatation of the right lateral ventricle is unchanged. The basilar cisterns are patent. No evidence of territorial infarct. No intracranial fluid collection. Calvarium is intact. Included paranasal sinuses and mastoid air cells are well aerated. IMPRESSION: Remote prior infarcts.  No acute intracranial abnormality. Electronically Signed   By: Jeb Levering M.D.   On: 05/31/2015 02:22   I have personally reviewed and evaluated these images and lab results as part of my medical decision-making.   EKG Interpretation   Date/Time:  Friday May 31 2015 01:03:43 EDT Ventricular Rate:  72 PR Interval:  296 QRS Duration: 104 QT Interval:  383 QTC Calculation: 419 R Axis:   8 Text Interpretation:  Sinus rhythm Prolonged PR interval Low voltage,  precordial leads Borderline T abnormalities, inferior leads When compared  with ECG of 10/1/20115, No significant change was found Confirmed by [facility]   MD, Deneen Slager ([id number]) on 05/31/2015 1:08:25 AM      MDM   Final diagnoses:  Weakness Anticoagulated on warfarin    Malaise of uncertain cause. Description is somewhat unusual. CT is ordered as well as laboratory workup and urinalysis and ECG. Review of past records shows that she is managed for atrial fibrillation and is anticoagulated because of that.  ED workup is unremarkable. CT is unchanged from baseline and chest x-ray is unremarkable. Laboratory workup is normal with the therapeutic INR. Patient is reassured regarding these findings and is discharged with instructions to  follow-up with PCP. Return if symptoms are worsening.  Delora Fuel, MD 44/62/86 [id number]  Delora Fuel, MD 71/16/57 [id number]

## 2015-05-31 NOTE — Discharge Instructions (Signed)
Your evaluation in the emergency department did not show an obvious cause for your symptoms. Please make sure to follow-up with your primary care provider in the next 3 days. Return to the emergency department if symptoms are getting worse.  Weakness Weakness is a lack of strength. It may be felt all over the body (generalized) or in one specific part of the body (focal). Some causes of weakness can be serious. You may need further medical evaluation, especially if you are elderly or you have a history of immunosuppression (such as chemotherapy or HIV), kidney disease, heart disease, or diabetes. CAUSES  Weakness can be caused by many different things, including:  Infection.  Physical exhaustion.  Internal bleeding or other blood loss that results in a lack of red blood cells (anemia).  Dehydration. This cause is more common in elderly people.  Side effects or electrolyte abnormalities from medicines, such as pain medicines or sedatives.  Emotional distress, anxiety, or depression.  Circulation problems, especially severe peripheral arterial disease.  Heart disease, such as rapid atrial fibrillation, bradycardia, or heart failure.  Nervous system disorders, such as Guillain-Barr syndrome, multiple sclerosis, or stroke. DIAGNOSIS  To find the cause of your weakness, your caregiver will take your history and perform a physical exam. Lab tests or X-rays may also be ordered, if needed. TREATMENT  Treatment of weakness depends on the cause of your symptoms and can vary greatly. HOME CARE INSTRUCTIONS   Rest as needed.  Eat a well-balanced diet.  Try to get some exercise every day.  Only take over-the-counter or prescription medicines as directed by your caregiver. SEEK MEDICAL CARE IF:   Your weakness seems to be getting worse or spreads to other parts of your body.  You develop new aches or pains. SEEK IMMEDIATE MEDICAL CARE IF:   You cannot perform your normal daily  activities, such as getting dressed and feeding yourself.  You cannot walk up and down stairs, or you feel exhausted when you do so.  You have shortness of breath or chest pain.  You have difficulty moving parts of your body.  You have weakness in only one area of the body or on only one side of the body.  You have a fever.  You have trouble speaking or swallowing.  You cannot control your bladder or bowel movements.  You have black or bloody vomit or stools. MAKE SURE YOU:  Understand these instructions.  Will watch your condition.  Will get help right away if you are not doing well or get worse.   This information is not intended to replace advice given to you by your health care provider. Make sure you discuss any questions you have with your health care provider.   Document Released: 07/27/2005 Document Revised: 01/26/2012 Document Reviewed: 09/25/2011 Elsevier Interactive Patient Education [employer].

## 2015-05-31 NOTE — ED Notes (Signed)
Pt taken off bedpan, pt unable to urinate at this time

## 2015-05-31 NOTE — ED Notes (Signed)
Pt brought in by [facility] for c/o not feeling well; pt states she can't explain how she feels just like "I'm going in and out"; pt has had a stroke in the past causing deficit to her left side

## 2015-06-19 ENCOUNTER — Ambulatory Visit (INDEPENDENT_AMBULATORY_CARE_PROVIDER_SITE_OTHER): Payer: Medicare Other | Admitting: *Deleted

## 2015-06-19 DIAGNOSIS — I2699 Other pulmonary embolism without acute cor pulmonale: Secondary | ICD-10-CM | POA: Diagnosis not present

## 2015-06-19 DIAGNOSIS — I4891 Unspecified atrial fibrillation: Secondary | ICD-10-CM | POA: Diagnosis not present

## 2015-06-19 DIAGNOSIS — Z5181 Encounter for therapeutic drug level monitoring: Secondary | ICD-10-CM

## 2015-06-19 LAB — POCT INR: INR: 2.5

## 2015-07-31 ENCOUNTER — Ambulatory Visit (INDEPENDENT_AMBULATORY_CARE_PROVIDER_SITE_OTHER): Payer: Medicare Other | Admitting: Pharmacist

## 2015-07-31 DIAGNOSIS — I2699 Other pulmonary embolism without acute cor pulmonale: Secondary | ICD-10-CM | POA: Diagnosis not present

## 2015-07-31 DIAGNOSIS — I4891 Unspecified atrial fibrillation: Secondary | ICD-10-CM | POA: Diagnosis not present

## 2015-07-31 DIAGNOSIS — Z5181 Encounter for therapeutic drug level monitoring: Secondary | ICD-10-CM | POA: Diagnosis not present

## 2015-07-31 LAB — POCT INR: INR: 2.8

## 2015-09-16 ENCOUNTER — Ambulatory Visit (INDEPENDENT_AMBULATORY_CARE_PROVIDER_SITE_OTHER): Payer: Medicare Other | Admitting: *Deleted

## 2015-09-16 DIAGNOSIS — I4891 Unspecified atrial fibrillation: Secondary | ICD-10-CM | POA: Diagnosis not present

## 2015-09-16 DIAGNOSIS — Z5181 Encounter for therapeutic drug level monitoring: Secondary | ICD-10-CM | POA: Diagnosis not present

## 2015-09-16 DIAGNOSIS — I2699 Other pulmonary embolism without acute cor pulmonale: Secondary | ICD-10-CM

## 2015-09-16 LAB — POCT INR: INR: 2

## 2015-10-28 ENCOUNTER — Ambulatory Visit (INDEPENDENT_AMBULATORY_CARE_PROVIDER_SITE_OTHER): Payer: Medicare Other | Admitting: *Deleted

## 2015-10-28 DIAGNOSIS — Z5181 Encounter for therapeutic drug level monitoring: Secondary | ICD-10-CM | POA: Diagnosis not present

## 2015-10-28 DIAGNOSIS — I4891 Unspecified atrial fibrillation: Secondary | ICD-10-CM

## 2015-10-28 DIAGNOSIS — I2699 Other pulmonary embolism without acute cor pulmonale: Secondary | ICD-10-CM | POA: Diagnosis not present

## 2015-10-28 LAB — POCT INR: INR: 1.8

## 2015-11-20 ENCOUNTER — Ambulatory Visit (INDEPENDENT_AMBULATORY_CARE_PROVIDER_SITE_OTHER): Payer: Medicare Other | Admitting: *Deleted

## 2015-11-20 DIAGNOSIS — I2699 Other pulmonary embolism without acute cor pulmonale: Secondary | ICD-10-CM

## 2015-11-20 DIAGNOSIS — I4891 Unspecified atrial fibrillation: Secondary | ICD-10-CM

## 2015-11-20 DIAGNOSIS — Z5181 Encounter for therapeutic drug level monitoring: Secondary | ICD-10-CM

## 2015-11-20 LAB — POCT INR: INR: 2.3

## 2015-12-18 ENCOUNTER — Ambulatory Visit (INDEPENDENT_AMBULATORY_CARE_PROVIDER_SITE_OTHER): Payer: Medicare Other | Admitting: *Deleted

## 2015-12-18 DIAGNOSIS — I2699 Other pulmonary embolism without acute cor pulmonale: Secondary | ICD-10-CM

## 2015-12-18 DIAGNOSIS — I4891 Unspecified atrial fibrillation: Secondary | ICD-10-CM

## 2015-12-18 DIAGNOSIS — Z5181 Encounter for therapeutic drug level monitoring: Secondary | ICD-10-CM | POA: Diagnosis not present

## 2015-12-18 LAB — POCT INR: INR: 3.3

## 2016-01-13 ENCOUNTER — Ambulatory Visit (INDEPENDENT_AMBULATORY_CARE_PROVIDER_SITE_OTHER): Payer: Medicare Other | Admitting: *Deleted

## 2016-01-13 DIAGNOSIS — Z5181 Encounter for therapeutic drug level monitoring: Secondary | ICD-10-CM | POA: Diagnosis not present

## 2016-01-13 DIAGNOSIS — I4891 Unspecified atrial fibrillation: Secondary | ICD-10-CM | POA: Diagnosis not present

## 2016-01-13 DIAGNOSIS — I2699 Other pulmonary embolism without acute cor pulmonale: Secondary | ICD-10-CM

## 2016-01-13 LAB — POCT INR: INR: 2.5

## 2016-02-10 ENCOUNTER — Ambulatory Visit (INDEPENDENT_AMBULATORY_CARE_PROVIDER_SITE_OTHER): Payer: Medicare Other | Admitting: *Deleted

## 2016-02-10 DIAGNOSIS — I2699 Other pulmonary embolism without acute cor pulmonale: Secondary | ICD-10-CM

## 2016-02-10 DIAGNOSIS — I4891 Unspecified atrial fibrillation: Secondary | ICD-10-CM

## 2016-02-10 DIAGNOSIS — Z5181 Encounter for therapeutic drug level monitoring: Secondary | ICD-10-CM | POA: Diagnosis not present

## 2016-02-10 LAB — POCT INR: INR: 2.6

## 2016-03-23 ENCOUNTER — Ambulatory Visit (INDEPENDENT_AMBULATORY_CARE_PROVIDER_SITE_OTHER): Payer: Medicare Other | Admitting: *Deleted

## 2016-03-23 DIAGNOSIS — I2699 Other pulmonary embolism without acute cor pulmonale: Secondary | ICD-10-CM | POA: Diagnosis not present

## 2016-03-23 DIAGNOSIS — Z5181 Encounter for therapeutic drug level monitoring: Secondary | ICD-10-CM | POA: Diagnosis not present

## 2016-03-23 DIAGNOSIS — I4891 Unspecified atrial fibrillation: Secondary | ICD-10-CM

## 2016-03-23 LAB — POCT INR: INR: 3

## 2016-04-19 ENCOUNTER — Encounter (HOSPITAL_COMMUNITY): Payer: Self-pay | Admitting: Emergency Medicine

## 2016-04-19 ENCOUNTER — Emergency Department (HOSPITAL_COMMUNITY)
Admission: EM | Admit: 2016-04-19 | Discharge: 2016-04-19 | Disposition: A | Discharge status: Home / Self Care | Payer: Medicare Other | Attending: Emergency Medicine | Admitting: Emergency Medicine

## 2016-04-19 DIAGNOSIS — Z79899 Other long term (current) drug therapy: Secondary | ICD-10-CM | POA: Diagnosis not present

## 2016-04-19 DIAGNOSIS — Z7901 Long term (current) use of anticoagulants: Secondary | ICD-10-CM | POA: Diagnosis not present

## 2016-04-19 DIAGNOSIS — Z87891 Personal history of nicotine dependence: Secondary | ICD-10-CM | POA: Insufficient documentation

## 2016-04-19 DIAGNOSIS — I2510 Atherosclerotic heart disease of native coronary artery without angina pectoris: Secondary | ICD-10-CM | POA: Diagnosis not present

## 2016-04-19 DIAGNOSIS — I10 Essential (primary) hypertension: Secondary | ICD-10-CM | POA: Insufficient documentation

## 2016-04-19 DIAGNOSIS — L03211 Cellulitis of face: Secondary | ICD-10-CM | POA: Diagnosis not present

## 2016-04-19 DIAGNOSIS — R220 Localized swelling, mass and lump, head: Secondary | ICD-10-CM | POA: Diagnosis present

## 2016-04-19 LAB — CBC WITH DIFFERENTIAL/PLATELET
BASOS ABS: 0 10*3/uL (ref 0.0–0.1)
BASOS PCT: 0 %
EOS ABS: 0.1 10*3/uL (ref 0.0–0.7)
Eosinophils Relative: 0 %
HCT: 38.6 % (ref 36.0–46.0)
HEMOGLOBIN: 12.8 g/dL (ref 12.0–15.0)
Lymphocytes Relative: 22 %
Lymphs Abs: 4 10*3/uL (ref 0.7–4.0)
MCH: 31.3 pg (ref 26.0–34.0)
MCHC: 33.2 g/dL (ref 30.0–36.0)
MCV: 94.4 fL (ref 78.0–100.0)
MONOS PCT: 16 %
Monocytes Absolute: 2.9 10*3/uL — ABNORMAL HIGH (ref 0.1–1.0)
NEUTROS PCT: 62 %
Neutro Abs: 11.6 10*3/uL — ABNORMAL HIGH (ref 1.7–7.7)
Platelets: 384 10*3/uL (ref 150–400)
RBC: 4.09 MIL/uL (ref 3.87–5.11)
RDW: 14.2 % (ref 11.5–15.5)
WBC: 18.6 10*3/uL — AB (ref 4.0–10.5)

## 2016-04-19 LAB — BASIC METABOLIC PANEL
ANION GAP: 10 (ref 5–15)
BUN: 13 mg/dL (ref 6–20)
CALCIUM: 9 mg/dL (ref 8.9–10.3)
CHLORIDE: 105 mmol/L (ref 101–111)
CO2: 22 mmol/L (ref 22–32)
CREATININE: 0.71 mg/dL (ref 0.44–1.00)
GFR calc non Af Amer: >60 mL/min (ref >60)
Glucose, Bld: 123 mg/dL — ABNORMAL HIGH (ref 65–99)
Potassium: 4.1 mmol/L (ref 3.5–5.1)
SODIUM: 137 mmol/L (ref 135–145)

## 2016-04-19 MED ORDER — SODIUM CHLORIDE 0.9 % IV SOLN
INTRAVENOUS | Status: DC
  Administered 2016-04-19: 22:00:00 via INTRAVENOUS

## 2016-04-19 MED ORDER — DEXTROSE 5 % IV SOLN
1.0000 g | Freq: Once | INTRAVENOUS | Status: AC
  Administered 2016-04-19: 1 g via INTRAVENOUS
  Filled 2016-04-19: qty 10

## 2016-04-19 MED ORDER — CEPHALEXIN 500 MG PO CAPS: 500.0000 mg | ORAL_CAPSULE | Freq: Four times a day (QID) | ORAL | 0 refills | Status: DC

## 2016-04-19 MED ORDER — ONDANSETRON HCL 4 MG/2ML IJ SOLN
4.0000 mg | Freq: Once | INTRAMUSCULAR | Status: AC
  Administered 2016-04-19: 4 mg via INTRAVENOUS
  Filled 2016-04-19: qty 2

## 2016-04-19 MED ORDER — SODIUM CHLORIDE 0.9 % IV BOLUS (SEPSIS)
250.0000 mL | Freq: Once | INTRAVENOUS | Status: AC
  Administered 2016-04-19: 250 mL via INTRAVENOUS

## 2016-04-19 NOTE — ED Notes (Signed)
Pt reports three day history of intermittent swelling of her upper lip. She reports that she has not been seen by a medical personnel and has had benadryl once. She denies any new meds, but reports she has a sleep apnea machine that is cleaned by her rarely.   She has a swollen upper lip that she reports came from a infection in her neck but has no other information regarding this.   Her upper lip is markedly swollen as well as reddened she is alert without any airway compromise

## 2016-04-19 NOTE — ED Notes (Signed)
Physician in for initial assessment

## 2016-04-19 NOTE — ED Notes (Addendum)
Completed call to [facility]- pt is going to be picked up by staff who are [place] with [place], [place]

## 2016-04-19 NOTE — ED Notes (Signed)
Call from [place], [place] supervisor from pt ECF- update given -

## 2016-04-19 NOTE — ED Provider Notes (Signed)
[place] DEPT Provider Note   CSN: [id number] Arrival date & time: 04/19/16  1616     History   Chief Complaint Chief Complaint  Patient presents with  . Facial Swelling    HPI JAYSE HAMMOND is a 60 y.o. female.  The patient states that 2-3 day history of pain that started in the left nares area now has upper lip swelling. Patient thinks that she has a cyst or pimple in the left nose area. Has not been trying to squeeze it no purulent discharge. Patient denies any tongue swelling.      Past Medical History:  Diagnosis Date  . Anxiety   . Arthritis   . Cancer (HCC)    cervical  . 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 Coronary artery disease   . Foot drop    left foot  . Hypertension   . Left foot drop   . 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 PONV (postoperative nausea and vomiting)   . Seizures ([place])    last one 10 years ago  . Sleep apnea   . Stroke [facility])    at age 32, left sided weakness    Patient Active Problem List   Diagnosis Date Noted  . Encounter for therapeutic drug monitoring 09/14/2013  . Long term (current) use of anticoagulants 12/14/2012  . Atrial fibrillation ([place]) 12/14/2012  . Acute pulmonary embolism ([place]) 12/05/2012  . Atrial flutter ([place]) 12/05/2012  . Unspecified constipation 09/28/2012  . Rectal bleeding 09/28/2012  . Heart palpitations 02/01/2012  . Hypertension 02/01/2012  . Chest pain at rest 02/01/2012    Past Surgical History:  Procedure Laterality Date  . BREAST SURGERY    . CERVICAL CONE BIOPSY    . COLONOSCOPY  2006   Dr. Anthony Sar: normal colonoscopy   . COLONOSCOPY WITH PROPOFOL N/A 10/06/2012   Procedure: COLONOSCOPY WITH PROPOFOL;  Surgeon: Daneil Dolin, MD;  Location: [facility];  Service: Endoscopy;  Laterality: N/A;  entered cecum @ 0905; total cecal withdrawal time=  94minutes  . DILATION AND CURETTAGE OF UTERUS    . LEG SURGERY    . LEG SURGERY     left  . OPEN REDUCTION INTERNAL  FIXATION (ORIF) HAND     right hand and arm  . POLYPECTOMY N/A 10/06/2012   Procedure: POLYPECTOMY;  Surgeon: Daneil Dolin, MD;  Location: [facility];  Service: Endoscopy;  Laterality: N/A;  hepatic flexure polyp  . TONSILLECTOMY      OB History    Gravida Para Term Preterm AB Living   3 2 2   1      SAB TAB Ectopic Multiple Live Births   1               Home Medications    Prior to Admission medications   Medication Sig Start Date End Date Taking? Authorizing Provider  acetaminophen (TYLENOL) 500 MG tablet Take 1,000 mg by mouth every 6 (six) hours as needed for headache.   Yes Historical Provider, MD  Chlorphen-Pseudoephed-APAP (CORICIDIN D PO) Take 2 tablets by mouth every 4 (four) hours as needed (Congestion).    Yes Historical Provider, MD  Cholecalciferol (VITAMIN D) 2000 UNITS CAPS Take 1 capsule by mouth daily.   Yes Historical Provider, MD  cyanocobalamin (,VITAMIN B-12,) 1000 MCG/ML injection Inject 1,000 mcg into the muscle every 30 (thirty) days. 15th of each month   Yes Historical Provider, MD  diltiazem (TIAZAC) 300 MG 24 hr capsule Take 300 mg by mouth daily.   Yes Historical Provider, MD  docusate sodium (COLACE) 100 MG capsule Take 100 mg by mouth 2 (two) times daily.    Yes Historical Provider, MD  DULoxetine (CYMBALTA) 60 MG capsule Take 60 mg by mouth daily.     Yes Historical Provider, MD  fluticasone (FLONASE) 50 MCG/ACT nasal spray Place 2 sprays into both nostrils daily.   Yes Historical Provider, MD  furosemide (LASIX) 20 MG tablet Take 20 mg by mouth daily. Starting on 04/09/2016, take one tablet once daily for week, then take as needed thereafter   Yes Historical Provider, MD  gabapentin (NEURONTIN) 300 MG capsule Take 300-600 mg by mouth 3 (three) times daily. Takes 300 mg three times daily and 600 mg at bedtime.   Yes Historical Provider, MD  HYDROcodone-acetaminophen (NORCO/VICODIN) 5-325 MG per tablet Take 1-2 tablets by mouth See admin instructions. Take 1  tablet at 08:00 AM, 1 tablet at 02:00 PM, and take 2 tablets at bedtime.   Yes Historical Provider, MD  Linaclotide Rolan Lipa) 290 MCG CAPS Take 1 capsule by mouth daily. 30 minutes before breakfast. 10/13/12  Yes Annitta Needs, NP  lisinopril (PRINIVIL,ZESTRIL) 40 MG tablet Take 40 mg by mouth daily.     Yes Historical Provider, MD  loratadine (CLARITIN) 10 MG tablet Take 10 mg by mouth daily.   Yes Historical Provider, MD  lovastatin (MEVACOR) 20 MG tablet Take 20 mg by mouth at bedtime.   Yes Historical Provider, MD  omeprazole (PRILOSEC) 20 MG capsule Take 20 mg by mouth daily.     Yes Historical Provider, MD  polycarbophil (FIBER-LAX) 625 MG tablet Take 625 mg by mouth daily as needed (constipation).   Yes Historical Provider, MD  polyethylene glycol powder (MIRALAX) powder 1 scoop 1 to 4 times daily Patient taking differently: Take 17 g by mouth 4 (four) times daily as needed for mild constipation or moderate constipation.  01/08/15  Yes Florian Buff, MD  prochlorperazine (COMPAZINE) 10 MG tablet Take 10 mg by mouth every 8 (eight) hours as needed for nausea.    Yes Historical Provider, MD  senna (SENOKOT) 8.6 MG TABS Take 2 tablets by mouth daily.   Yes Historical Provider, MD  tolterodine (DETROL) 2 MG tablet Take 2 mg by mouth 2 (two) times daily.   Yes Historical Provider, MD  topiramate (TOPAMAX) 25 MG tablet Take 50 mg by mouth at bedtime.   Yes Historical Provider, MD  Vitamins A & D (VITAMIN A & D) ointment Apply 1 application topically as needed for dry skin.   Yes Historical Provider, MD  warfarin (COUMADIN) 2.5 MG tablet TAKE 2 TABLETS (5mg ) BY MOUTH ON MONDAY. TAKE 1 TABLET (2.5mg ) ONCE DAILY ON ALL OTHER DAYS. Patient taking differently: TAKE 2 TABLETS (5mg ) BY MOUTH ON MONDAY AND THURSDAY. TAKE 1 TABLET (2.5mg ) ONCE DAILY ON ALL OTHER DAYS. 04/01/15  Yes Satira Sark, MD  cephALEXin (KEFLEX) 500 MG capsule Take 1 capsule (500 mg total) by mouth 4 (four) times daily. 04/19/16    Fredia Sorrow, MD  guaiFENesin (MUCINEX) 600 MG 12 hr tablet Take 600 mg by mouth 2 (two) times daily as needed for congestion.    Historical Provider, MD    Family History Family History  Problem Relation Age of Onset  . Diabetes Mother   . Diabetes Sister   . Cancer Brother     "all over"   . Cancer  Other   . Diabetes Other   . Colon cancer      unsure    Social History Social History  Substance Use Topics  . Smoking status: Former Smoker    Years: 5.00    Quit date: 03/25/1981  . Smokeless tobacco: Never Used  . Alcohol use No     Allergies   Latex and Other   Review of Systems Review of Systems  Constitutional: Negative for fever.  HENT: Positive for facial swelling. Negative for congestion and trouble swallowing.   Eyes: Negative for visual disturbance.  Respiratory: Negative for shortness of breath.   Cardiovascular: Negative for chest pain.  Gastrointestinal: Negative for abdominal pain.  Musculoskeletal: Negative for neck pain.  Neurological: Negative for headaches.  Hematological: Does not bruise/bleed easily.  Psychiatric/Behavioral: Negative for confusion.     Physical Exam Updated Vital Signs BP 151/73   Pulse 98   Temp 99.1 F (37.3 C) (Oral)   Resp 24   Ht 5\' 3"  (1.6 m)   Wt 102.5 kg   SpO2 96%   BMI 40.03 kg/m   Physical Exam  Constitutional: She is oriented to person, place, and time. She appears well-developed and well-nourished. No distress.  HENT:  Head: Normocephalic.  Patient of with area of induration left nares consistent with a boil or cyst. No pointing no drainage. Swelling to the upper lip. No tongue swelling. No fluctuance area of induration measures about the 4 x 4 centimeters. Some erythema.  Eyes: EOM are normal. Pupils are equal, round, and reactive to light.  Neck: Normal range of motion. Neck supple.  Cardiovascular: Normal rate, regular rhythm and normal heart sounds.   Pulmonary/Chest: Effort normal and breath  sounds normal. No respiratory distress.  Abdominal: Soft. Bowel sounds are normal. There is no tenderness.  Musculoskeletal: Normal range of motion.  Neurological: She is alert and oriented to person, place, and time.  Skin: Skin is warm.  Nursing note and vitals reviewed.    ED Treatments / Results  Labs (all labs ordered are listed, but only abnormal results are displayed) Labs Reviewed  CBC WITH DIFFERENTIAL/PLATELET - Abnormal; Notable for the following:       Result Value   WBC 18.6 (*)    Neutro Abs 11.6 (*)    Monocytes Absolute 2.9 (*)    All other components within normal limits  BASIC METABOLIC PANEL - Abnormal; Notable for the following:    Glucose, Bld 123 (*)    All other components within normal limits   Results for orders placed or performed during the hospital encounter of 04/19/16  CBC with Differential/Platelet  Result Value Ref Range   WBC 18.6 (H) 4.0 - 10.5 K/uL   RBC 4.09 3.87 - 5.11 MIL/uL   Hemoglobin 12.8 12.0 - 15.0 g/dL   HCT 38.6 36.0 - 46.0 %   MCV 94.4 78.0 - 100.0 fL   MCH 31.3 26.0 - 34.0 pg   MCHC 33.2 30.0 - 36.0 g/dL   RDW 14.2 11.5 - 15.5 %   Platelets 384 150 - 400 K/uL   Neutrophils Relative % 62 %   Neutro Abs 11.6 (H) 1.7 - 7.7 K/uL   Lymphocytes Relative 22 %   Lymphs Abs 4.0 0.7 - 4.0 K/uL   Monocytes Relative 16 %   Monocytes Absolute 2.9 (H) 0.1 - 1.0 K/uL   Eosinophils Relative 0 %   Eosinophils Absolute 0.1 0.0 - 0.7 K/uL   Basophils Relative 0 %  Basophils Absolute 0.0 0.0 - 0.1 K/uL  Basic metabolic panel  Result Value Ref Range   Sodium 137 135 - 145 mmol/L   Potassium 4.1 3.5 - 5.1 mmol/L   Chloride 105 101 - 111 mmol/L   CO2 22 22 - 32 mmol/L   Glucose, Bld 123 (H) 65 - 99 mg/dL   BUN 13 6 - 20 mg/dL   Creatinine, Ser 0.71 0.44 - 1.00 mg/dL   Calcium 9.0 8.9 - 10.3 mg/dL   GFR calc non Af Amer >60 >60 mL/min   GFR calc Af Amer >60 >60 mL/min   Anion gap 10 5 - 15     EKG  EKG Interpretation None        Radiology No results found.  Procedures Procedures (including critical care time)  Medications Ordered in ED Medications  0.9 %  sodium chloride infusion ( Intravenous Bolus from Bag 04/19/16 2210)  sodium chloride 0.9 % bolus 250 mL (0 mLs Intravenous Stopped 04/19/16 2110)  ondansetron (ZOFRAN) injection 4 mg (4 mg Intravenous Given 04/19/16 2010)  cefTRIAXone (ROCEPHIN) 1 g in dextrose 5 % 50 mL IVPB (0 g Intravenous Stopped 04/19/16 2141)     Initial Impression / Assessment and Plan / ED Course  I have reviewed the triage vital signs and the nursing notes.  Pertinent labs & imaging results that were available during my care of the patient were reviewed by me and considered in my medical decision making (see chart for details).  Clinical Course   The patient with a boil or abscess of left nares causing swelling to the upper lip area. No tongue swelling. Patient nontoxic no acute distress. Improvement with IV Rocephin here will continue on Keflex. No I&D required.  Leukocytosis noted. The patient with normal vital signs.  Final Clinical Impressions(s) / ED Diagnoses   Final diagnoses:  Facial cellulitis    New Prescriptions New Prescriptions   CEPHALEXIN (KEFLEX) 500 MG CAPSULE    Take 1 capsule (500 mg total) by mouth 4 (four) times daily.     Fredia Sorrow, MD 04/19/16 2303

## 2016-04-19 NOTE — Discharge Instructions (Signed)
Patient received IV antibiotics here. Patient with a of boil small abscess nares of left nose. Causing swelling to the upper lip. No I&D required at this time. Will continue on Keflex antibiotic. Should improve over the next several days. Return for any new or worse symptoms.

## 2016-04-19 NOTE — ED Notes (Signed)
Awaiting recheck

## 2016-04-19 NOTE — ED Notes (Signed)
Physician reassessment

## 2016-04-19 NOTE — ED Triage Notes (Signed)
Pt with swelling and redness to her upper lip. Onset of symptoms 2-3 days ago. Pt states edema had been better but started getting larger again. Pt c/o nasal pain. Pt denies difficulty breathing.

## 2016-04-19 NOTE — ED Notes (Signed)
Awaiting disposition.

## 2016-04-19 NOTE — ED Notes (Signed)
Attempt to Call [facility] to give update and inform of pt return- unable complete call due to call going to busy after call

## 2016-04-19 NOTE — ED Notes (Signed)
Pt reports she has had dizziness over the past couple of days.

## 2016-05-04 ENCOUNTER — Ambulatory Visit (INDEPENDENT_AMBULATORY_CARE_PROVIDER_SITE_OTHER): Payer: Medicare Other | Admitting: *Deleted

## 2016-05-04 DIAGNOSIS — Z5181 Encounter for therapeutic drug level monitoring: Secondary | ICD-10-CM | POA: Diagnosis not present

## 2016-05-04 DIAGNOSIS — I2699 Other pulmonary embolism without acute cor pulmonale: Secondary | ICD-10-CM

## 2016-05-04 DIAGNOSIS — I4891 Unspecified atrial fibrillation: Secondary | ICD-10-CM | POA: Diagnosis not present

## 2016-05-04 LAB — POCT INR: INR: 2.3

## 2016-05-14 ENCOUNTER — Ambulatory Visit (INDEPENDENT_AMBULATORY_CARE_PROVIDER_SITE_OTHER): Payer: Medicare Other | Admitting: Cardiology

## 2016-05-14 ENCOUNTER — Encounter: Payer: Self-pay | Admitting: Cardiology

## 2016-05-14 VITALS — BP 112/64 | HR 96 | Ht 65.0 in | Wt 250.0 lb

## 2016-05-14 DIAGNOSIS — I4891 Unspecified atrial fibrillation: Secondary | ICD-10-CM | POA: Diagnosis not present

## 2016-05-14 DIAGNOSIS — I10 Essential (primary) hypertension: Secondary | ICD-10-CM | POA: Diagnosis not present

## 2016-05-14 DIAGNOSIS — I2510 Atherosclerotic heart disease of native coronary artery without angina pectoris: Secondary | ICD-10-CM

## 2016-05-14 NOTE — Progress Notes (Signed)
Clinical Summary Deborah Maddox is a 60 y.o.female seen as a new patient. She was last seen in our clincic over 3 years ago.   1. Afib - occasional palpitations. Occurs 1-2 times a week. Lasts just a few seconds.  - no bleeding issues on coumadin  2. HTN - compliant with meds  3. CAD - recent chest pain. Midchest sharp pain, worst with moving right arm. Lasts just a few seconds. No other associated symptoms. . Better with belching.  Total of 2-3 episodes.  - going on several years, more than 10 years without change.   4. CVA - chornic left sided weakness.   5. History of PE - compliant with coumadin.   Past Medical History:  Diagnosis Date  . Anxiety   . Arthritis   . Cancer (HCC)    cervical  . 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 Coronary artery disease   . Foot drop    left foot  . Hypertension   . Left foot drop   . 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 PONV (postoperative nausea and vomiting)   . Seizures ([place])    last one 10 years ago  . Sleep apnea   . Stroke [facility])    at age 15, left sided weakness     Allergies  Allergen Reactions  . Latex Rash  . Other Rash    HEART  MONITOR TAPE: severe rash, bleeding     Current Outpatient Prescriptions  Medication Sig Dispense Refill  . acetaminophen (TYLENOL) 500 MG tablet Take 1,000 mg by mouth every 6 (six) hours as needed for headache.    . cephALEXin (KEFLEX) 500 MG capsule Take 1 capsule (500 mg total) by mouth 4 (four) times daily. 28 capsule 0  . Chlorphen-Pseudoephed-APAP (CORICIDIN D PO) Take 2 tablets by mouth every 4 (four) hours as needed (Congestion).     . Cholecalciferol (VITAMIN D) 2000 UNITS CAPS Take 1 capsule by mouth daily.    . cyanocobalamin (,VITAMIN B-12,) 1000 MCG/ML injection Inject 1,000 mcg into the muscle every 30 (thirty) days. 15th of each month    . diltiazem (TIAZAC) 300 MG 24 hr capsule Take 300 mg by mouth daily.    Marland Kitchen docusate sodium (COLACE) 100 MG  capsule Take 100 mg by mouth 2 (two) times daily.     . DULoxetine (CYMBALTA) 60 MG capsule Take 60 mg by mouth daily.      . fluticasone (FLONASE) 50 MCG/ACT nasal spray Place 2 sprays into both nostrils daily.    . furosemide (LASIX) 20 MG tablet Take 20 mg by mouth daily. Starting on 04/09/2016, take one tablet once daily for week, then take as needed thereafter    . gabapentin (NEURONTIN) 300 MG capsule Take 300-600 mg by mouth 3 (three) times daily. Takes 300 mg three times daily and 600 mg at bedtime.    Marland Kitchen guaiFENesin (MUCINEX) 600 MG 12 hr tablet Take 600 mg by mouth 2 (two) times daily as needed for congestion.    Marland Kitchen HYDROcodone-acetaminophen (NORCO/VICODIN) 5-325 MG per tablet Take 1-2 tablets by mouth See admin instructions. Take 1 tablet at 08:00 AM, 1 tablet at 02:00 PM, and take 2 tablets at bedtime.    . Linaclotide (LINZESS) 290 MCG CAPS Take 1 capsule by mouth daily. 30 minutes before breakfast. 30 capsule 3  . lisinopril (PRINIVIL,ZESTRIL) 40 MG tablet Take 40 mg by mouth daily.      Marland Kitchen  loratadine (CLARITIN) 10 MG tablet Take 10 mg by mouth daily.    Marland Kitchen lovastatin (MEVACOR) 20 MG tablet Take 20 mg by mouth at bedtime.    Marland Kitchen omeprazole (PRILOSEC) 20 MG capsule Take 20 mg by mouth daily.      . polycarbophil (FIBER-LAX) 625 MG tablet Take 625 mg by mouth daily as needed (constipation).    . polyethylene glycol powder (MIRALAX) powder 1 scoop 1 to 4 times daily (Patient taking differently: Take 17 g by mouth 4 (four) times daily as needed for mild constipation or moderate constipation. ) 255 g 11  . prochlorperazine (COMPAZINE) 10 MG tablet Take 10 mg by mouth every 8 (eight) hours as needed for nausea.     Marland Kitchen senna (SENOKOT) 8.6 MG TABS Take 2 tablets by mouth daily.    Marland Kitchen tolterodine (DETROL) 2 MG tablet Take 2 mg by mouth 2 (two) times daily.    Marland Kitchen topiramate (TOPAMAX) 25 MG tablet Take 50 mg by mouth at bedtime.    . Vitamins A & D (VITAMIN A & D) ointment Apply 1 application topically as  needed for dry skin.    Marland Kitchen warfarin (COUMADIN) 2.5 MG tablet TAKE 2 TABLETS (5mg ) BY MOUTH ON MONDAY. TAKE 1 TABLET (2.5mg ) ONCE DAILY ON ALL OTHER DAYS. (Patient taking differently: TAKE 2 TABLETS (5mg ) BY MOUTH ON MONDAY AND THURSDAY. TAKE 1 TABLET (2.5mg ) ONCE DAILY ON ALL OTHER DAYS.) 35 tablet 6   No current facility-administered medications for this visit.      Past Surgical History:  Procedure Laterality Date  . BREAST SURGERY    . CERVICAL CONE BIOPSY    . COLONOSCOPY  2006   Dr. Anthony Sar: normal colonoscopy   . COLONOSCOPY WITH PROPOFOL N/A 10/06/2012   Procedure: COLONOSCOPY WITH PROPOFOL;  Surgeon: Daneil Dolin, MD;  Location: [facility];  Service: Endoscopy;  Laterality: N/A;  entered cecum @ 0905; total cecal withdrawal time=  60minutes  . DILATION AND CURETTAGE OF UTERUS    . LEG SURGERY    . LEG SURGERY     left  . OPEN REDUCTION INTERNAL FIXATION (ORIF) HAND     right hand and arm  . POLYPECTOMY N/A 10/06/2012   Procedure: POLYPECTOMY;  Surgeon: Daneil Dolin, MD;  Location: [facility];  Service: Endoscopy;  Laterality: N/A;  hepatic flexure polyp  . TONSILLECTOMY       Allergies  Allergen Reactions  . Latex Rash  . Other Rash    HEART  MONITOR TAPE: severe rash, bleeding      Family History  Problem Relation Age of Onset  . Diabetes Mother   . Diabetes Sister   . Cancer Brother     "all over"   . Cancer Other   . Diabetes Other   . Colon cancer      unsure     Social History Deborah Maddox reports that she quit smoking about 35 years ago. She quit after 5.00 years of use. She has never used smokeless tobacco. Deborah Maddox reports that she does not drink alcohol.   Review of Systems CONSTITUTIONAL: No weight loss, fever, chills, weakness or fatigue.  HEENT: Eyes: No visual loss, blurred vision, double vision or yellow sclerae.No hearing loss, sneezing, congestion, runny nose or sore throat.  SKIN: No rash or itching.  CARDIOVASCULAR: per HPI RESPIRATORY: No  shortness of breath, cough or sputum.  GASTROINTESTINAL: No anorexia, nausea, vomiting or diarrhea. No abdominal pain or blood.  GENITOURINARY: No burning on  urination, no polyuria NEUROLOGICAL: No headache, dizziness, syncope, paralysis, ataxia, numbness or tingling in the extremities. No change in bowel or bladder control.  MUSCULOSKELETAL: No muscle, back pain, joint pain or stiffness.  LYMPHATICS: No enlarged nodes. No history of splenectomy.  PSYCHIATRIC: No history of depression or anxiety.  ENDOCRINOLOGIC: No reports of sweating, cold or heat intolerance. No polyuria or polydipsia.  Marland Kitchen   Physical Examination Vitals:   05/14/16 1011  BP: 112/64  Pulse: 96   Vitals:   05/14/16 1011  Weight: 250 lb (113.4 kg)  Height: 5\' 5"  (1.651 m)    Gen: resting comfortably, no acute distress HEENT: no scleral icterus, pupils equal round and reactive, no palptable cervical adenopathy,  CV: RRR, no m/r/g, no jvd Resp: Clear to auscultation bilaterally GI: abdomen is soft, non-tender, non-distended, normal bowel sounds, no hepatosplenomegaly MSK: extremities are warm, no edema.  Skin: warm, no rash Neuro:  no focal deficits Psych: appropriate affect     Assessment and Plan  1. Afib - fairly mild infrequent symptoms - Deborah Maddox will continue current meds at this time  2. HTN - at goal, continue current meds  3. CAD - mild atypical chest pain, unchanged nearly 10 years - continue top monitor at this time  F/u 6 months      Arnoldo Lenis, M.D.

## 2016-05-14 NOTE — Patient Instructions (Signed)

## 2016-06-15 ENCOUNTER — Ambulatory Visit (INDEPENDENT_AMBULATORY_CARE_PROVIDER_SITE_OTHER): Payer: Medicare Other | Admitting: *Deleted

## 2016-06-15 DIAGNOSIS — Z5181 Encounter for therapeutic drug level monitoring: Secondary | ICD-10-CM

## 2016-06-15 DIAGNOSIS — I4891 Unspecified atrial fibrillation: Secondary | ICD-10-CM | POA: Diagnosis not present

## 2016-06-15 DIAGNOSIS — I2699 Other pulmonary embolism without acute cor pulmonale: Secondary | ICD-10-CM | POA: Diagnosis not present

## 2016-06-15 LAB — POCT INR: INR: 2.5

## 2016-07-27 ENCOUNTER — Encounter: Payer: Self-pay | Admitting: *Deleted

## 2016-07-29 ENCOUNTER — Ambulatory Visit (INDEPENDENT_AMBULATORY_CARE_PROVIDER_SITE_OTHER): Payer: Medicare Other | Admitting: *Deleted

## 2016-07-29 DIAGNOSIS — I2699 Other pulmonary embolism without acute cor pulmonale: Secondary | ICD-10-CM | POA: Diagnosis not present

## 2016-07-29 DIAGNOSIS — Z5181 Encounter for therapeutic drug level monitoring: Secondary | ICD-10-CM | POA: Diagnosis not present

## 2016-07-29 DIAGNOSIS — I4891 Unspecified atrial fibrillation: Secondary | ICD-10-CM | POA: Diagnosis not present

## 2016-07-29 DIAGNOSIS — I2510 Atherosclerotic heart disease of native coronary artery without angina pectoris: Secondary | ICD-10-CM

## 2016-07-29 LAB — POCT INR: INR: 2.8

## 2016-09-09 ENCOUNTER — Encounter: Payer: Self-pay | Admitting: *Deleted

## 2016-09-21 ENCOUNTER — Ambulatory Visit (INDEPENDENT_AMBULATORY_CARE_PROVIDER_SITE_OTHER): Payer: Medicare Other | Admitting: *Deleted

## 2016-09-21 DIAGNOSIS — Z5181 Encounter for therapeutic drug level monitoring: Secondary | ICD-10-CM

## 2016-09-21 DIAGNOSIS — I4891 Unspecified atrial fibrillation: Secondary | ICD-10-CM

## 2016-09-21 DIAGNOSIS — I2699 Other pulmonary embolism without acute cor pulmonale: Secondary | ICD-10-CM | POA: Diagnosis not present

## 2016-09-21 LAB — POCT INR: INR: 2.5

## 2016-10-21 ENCOUNTER — Other Ambulatory Visit (HOSPITAL_BASED_OUTPATIENT_CLINIC_OR_DEPARTMENT_OTHER): Payer: Self-pay

## 2016-10-21 DIAGNOSIS — G4733 Obstructive sleep apnea (adult) (pediatric): Secondary | ICD-10-CM

## 2016-10-21 DIAGNOSIS — G40909 Epilepsy, unspecified, not intractable, without status epilepticus: Secondary | ICD-10-CM

## 2016-11-01 ENCOUNTER — Ambulatory Visit: Payer: Medicare Other | Attending: Neurology

## 2016-11-01 DIAGNOSIS — G40909 Epilepsy, unspecified, not intractable, without status epilepticus: Secondary | ICD-10-CM

## 2016-11-01 DIAGNOSIS — G4733 Obstructive sleep apnea (adult) (pediatric): Secondary | ICD-10-CM

## 2016-11-02 ENCOUNTER — Ambulatory Visit (INDEPENDENT_AMBULATORY_CARE_PROVIDER_SITE_OTHER): Payer: Medicare Other | Admitting: *Deleted

## 2016-11-02 DIAGNOSIS — Z5181 Encounter for therapeutic drug level monitoring: Secondary | ICD-10-CM

## 2016-11-02 DIAGNOSIS — I4891 Unspecified atrial fibrillation: Secondary | ICD-10-CM | POA: Diagnosis not present

## 2016-11-02 DIAGNOSIS — I2699 Other pulmonary embolism without acute cor pulmonale: Secondary | ICD-10-CM

## 2016-11-02 LAB — POCT INR: INR: 2.2

## 2016-11-12 ENCOUNTER — Other Ambulatory Visit (HOSPITAL_BASED_OUTPATIENT_CLINIC_OR_DEPARTMENT_OTHER): Payer: Self-pay

## 2016-11-12 DIAGNOSIS — G4730 Sleep apnea, unspecified: Secondary | ICD-10-CM

## 2016-11-17 ENCOUNTER — Ambulatory Visit: Payer: Medicare Other | Attending: Neurology

## 2016-11-17 DIAGNOSIS — G4733 Obstructive sleep apnea (adult) (pediatric): Secondary | ICD-10-CM | POA: Diagnosis not present

## 2016-11-17 DIAGNOSIS — G4730 Sleep apnea, unspecified: Secondary | ICD-10-CM | POA: Diagnosis present

## 2016-11-28 NOTE — Procedures (Unsigned)
[place] A. Merlene Laughter, MD     [email or web address]             HOME SLEEP STUDY  LOCATION: [place]   Patient Name: Deborah Maddox, Deborah Maddox Date: 11/17/2016 Gender: Female D.O.B: 1956/04/22 Age (years): 41 Referring Provider: Phillips Odor MD, ABSM Height (inches): 66 Interpreting Physician: Phillips Odor MD, ABSM Weight (lbs): 255 RPSGT: Rosebud Poles BMI: 76 MRN: [id number] Neck Size: CLINICAL INFORMATION Sleep Study Type: HST  Indication for sleep study: OSA  Epworth Sleepiness Score: NA  SLEEP STUDY TECHNIQUE A multi-channel overnight portable sleep study was performed. The channels recorded were: nasal airflow, thoracic respiratory movement, and oxygen saturation with a pulse oximetry. Snoring was also monitored.  MEDICATIONS Patient self administered medications include: N/A.  Current Outpatient Prescriptions:  .  acetaminophen (TYLENOL) 500 MG tablet, Take 1,000 mg by mouth every 6 (six) hours as needed for headache., Disp: , Rfl:  .  Cholecalciferol (VITAMIN D) 2000 UNITS CAPS, Take 1 capsule by mouth daily., Disp: , Rfl:  .  cyanocobalamin (,VITAMIN B-12,) 1000 MCG/ML injection, Inject 1,000 mcg into the muscle every 30 (thirty) days. 15th of each month, Disp: , Rfl:  .  diltiazem (TIAZAC) 300 MG 24 hr capsule, Take 300 mg by mouth daily., Disp: , Rfl:  .  docusate sodium (COLACE) 100 MG capsule, Take 100 mg by mouth 2 (two) times daily. , Disp: , Rfl:  .  DULoxetine (CYMBALTA) 60 MG capsule, Take 60 mg by mouth daily.  , Disp: , Rfl:  .  fluticasone (FLONASE) 50 MCG/ACT nasal spray, Place 2 sprays into both nostrils daily., Disp: , Rfl:  .  furosemide (LASIX) 20 MG tablet, Take 20 mg by mouth daily. Starting on 04/09/2016, take one tablet once daily for week, then take as needed thereafter, Disp: , Rfl:  .  gabapentin (NEURONTIN) 300 MG capsule, Take 300-600 mg by mouth 3 (three) times daily. Takes 300 mg three times daily and 600 mg at bedtime.,  Disp: , Rfl:  .  guaiFENesin (MUCINEX) 600 MG 12 hr tablet, Take 600 mg by mouth 2 (two) times daily as needed for congestion., Disp: , Rfl:  .  HYDROcodone-acetaminophen (NORCO/VICODIN) 5-325 MG per tablet, Take 1-2 tablets by mouth See admin instructions. Take 1 tablet at 08:00 AM, 1 tablet at 02:00 PM, and take 2 tablets at bedtime., Disp: , Rfl:  .  Linaclotide (LINZESS) 290 MCG CAPS, Take 1 capsule by mouth daily. 30 minutes before breakfast., Disp: 30 capsule, Rfl: 3 .  lisinopril (PRINIVIL,ZESTRIL) 40 MG tablet, Take 40 mg by mouth daily.  , Disp: , Rfl:  .  loratadine (CLARITIN) 10 MG tablet, Take 10 mg by mouth daily., Disp: , Rfl:  .  lovastatin (MEVACOR) 20 MG tablet, Take 20 mg by mouth at bedtime., Disp: , Rfl:  .  omeprazole (PRILOSEC) 20 MG capsule, Take 20 mg by mouth daily.  , Disp: , Rfl:  .  polycarbophil (FIBER-LAX) 625 MG tablet, Take 625 mg by mouth daily as needed (constipation)., Disp: , Rfl:  .  polyethylene glycol powder (MIRALAX) powder, 1 scoop 1 to 4 times daily (Patient taking differently: Take 17 g by mouth 4 (four) times daily as needed for mild constipation or moderate constipation. ), Disp: 255 g, Rfl: 11 .  prochlorperazine (COMPAZINE) 10 MG tablet, Take 10 mg by mouth every 8 (eight) hours as needed for nausea. , Disp: , Rfl:  .  senna (SENOKOT) 8.6 MG TABS, Take 2 tablets by mouth  daily., Disp: , Rfl:  .  tolterodine (DETROL) 2 MG tablet, Take 2 mg by mouth 2 (two) times daily., Disp: , Rfl:  .  topiramate (TOPAMAX) 25 MG tablet, Take 50 mg by mouth at bedtime., Disp: , Rfl:  .  Vitamins A & D (VITAMIN A & D) ointment, Apply 1 application topically as needed for dry skin., Disp: , Rfl:  .  warfarin (COUMADIN) 2.5 MG tablet, TAKE 2 TABLETS (5mg ) BY MOUTH ON MONDAY. TAKE 1 TABLET (2.5mg ) ONCE DAILY ON ALL OTHER DAYS. (Patient taking differently: TAKE 2 TABLETS (5mg ) BY MOUTH ON MONDAY AND THURSDAY. TAKE 1 TABLET (2.5mg ) ONCE DAILY ON ALL OTHER DAYS.), Disp: 35  tablet, Rfl: 6   RESPIRATORY PARAMETERS The overall AHI was 1.4 per hour, with a central apnea index of 0.0 per hour.  The oxygen nadir was 81% during sleep.  CARDIAC DATA Mean heart rate during sleep was 73.6 bpm.  IMPRESSIONS - No significant obstructive sleep apnea occurred during this study (AHI = 1.4/h). - No significant central sleep apnea occurred during this study (CAI = 0.0/h). - Moderate oxygen desaturation was noted during this study (Min O2 = 81%).   Delano Metz, MD Diplomate, American Board of Sleep Medicine.   ELECTRONICALLY SIGNED ON:  11/28/2016, 12:55 PM [place] PH: [phone]   FX: [phone] [place]

## 2016-12-14 ENCOUNTER — Ambulatory Visit (INDEPENDENT_AMBULATORY_CARE_PROVIDER_SITE_OTHER): Payer: Medicare Other | Admitting: *Deleted

## 2016-12-14 DIAGNOSIS — I2699 Other pulmonary embolism without acute cor pulmonale: Secondary | ICD-10-CM

## 2016-12-14 DIAGNOSIS — I4891 Unspecified atrial fibrillation: Secondary | ICD-10-CM

## 2016-12-14 DIAGNOSIS — Z5181 Encounter for therapeutic drug level monitoring: Secondary | ICD-10-CM

## 2016-12-14 LAB — POCT INR: INR: 2.6

## 2016-12-21 ENCOUNTER — Telehealth: Payer: Self-pay | Admitting: *Deleted

## 2016-12-21 NOTE — Telephone Encounter (Signed)
Returned call.  OK to change manufacture.

## 2016-12-21 NOTE — Telephone Encounter (Signed)
Pt's pharmacist Burr Medico called stating that the pt will be taking a new NDC for her [place], it's a new manufacture & Burr Medico wants to make sure it's ok she continues w/ this Designer, fashion/clothing. Burr Medico can be reached @ [phone]

## 2016-12-22 ENCOUNTER — Other Ambulatory Visit (HOSPITAL_COMMUNITY): Payer: Self-pay | Admitting: Internal Medicine

## 2016-12-22 DIAGNOSIS — R600 Localized edema: Secondary | ICD-10-CM

## 2016-12-28 ENCOUNTER — Ambulatory Visit (HOSPITAL_COMMUNITY)
Admission: RE | Admit: 2016-12-28 | Discharge: 2016-12-28 | Disposition: A | Discharge status: Home / Self Care | Payer: Medicare Other | Source: Ambulatory Visit | Attending: Internal Medicine | Admitting: Internal Medicine

## 2016-12-28 DIAGNOSIS — R600 Localized edema: Secondary | ICD-10-CM | POA: Insufficient documentation

## 2017-01-07 ENCOUNTER — Encounter: Payer: Self-pay | Admitting: *Deleted

## 2017-01-27 ENCOUNTER — Ambulatory Visit (INDEPENDENT_AMBULATORY_CARE_PROVIDER_SITE_OTHER): Payer: Medicare Other | Admitting: *Deleted

## 2017-01-27 DIAGNOSIS — I2699 Other pulmonary embolism without acute cor pulmonale: Secondary | ICD-10-CM

## 2017-01-27 DIAGNOSIS — Z5181 Encounter for therapeutic drug level monitoring: Secondary | ICD-10-CM | POA: Diagnosis not present

## 2017-01-27 DIAGNOSIS — I4891 Unspecified atrial fibrillation: Secondary | ICD-10-CM

## 2017-01-27 LAB — POCT INR: INR: 2.5

## 2017-03-10 ENCOUNTER — Ambulatory Visit (INDEPENDENT_AMBULATORY_CARE_PROVIDER_SITE_OTHER): Payer: Medicare Other | Admitting: *Deleted

## 2017-03-10 DIAGNOSIS — Z5181 Encounter for therapeutic drug level monitoring: Secondary | ICD-10-CM | POA: Diagnosis not present

## 2017-03-10 DIAGNOSIS — I4891 Unspecified atrial fibrillation: Secondary | ICD-10-CM

## 2017-03-10 DIAGNOSIS — I2699 Other pulmonary embolism without acute cor pulmonale: Secondary | ICD-10-CM

## 2017-03-10 LAB — POCT INR: INR: 2.3

## 2017-04-20 ENCOUNTER — Ambulatory Visit (INDEPENDENT_AMBULATORY_CARE_PROVIDER_SITE_OTHER): Payer: Medicare Other

## 2017-04-20 ENCOUNTER — Ambulatory Visit (INDEPENDENT_AMBULATORY_CARE_PROVIDER_SITE_OTHER): Payer: Medicare Other | Admitting: Orthopaedic Surgery

## 2017-04-20 ENCOUNTER — Encounter: Payer: Self-pay | Admitting: Orthopaedic Surgery

## 2017-04-20 VITALS — BP 123/76 | HR 81 | Temp 97.1°F

## 2017-04-20 DIAGNOSIS — G8929 Other chronic pain: Secondary | ICD-10-CM | POA: Diagnosis not present

## 2017-04-20 DIAGNOSIS — M25561 Pain in right knee: Secondary | ICD-10-CM

## 2017-04-20 NOTE — Progress Notes (Signed)
Subjective:    Patient ID: Deborah Maddox, female    DOB: 1955/12/11, 61 y.o.   MRN: [id number]  HPI She is a resident at [facility].  She has multiple medical problems and is on Coumadin.  She has had pain increasing in her right knee over the last three months.  She has no known trauma, no redness.  She has giving way and swelling and popping.  She is not getting any better. She cannot take any NSAID.  She has tried heat, ice and rubs with no help.  She does not walk that much now.   Review of Systems  HENT: Negative for congestion.   Respiratory: Negative for cough and shortness of breath.   Cardiovascular: Negative for chest pain and leg swelling.  Endocrine: Positive for cold intolerance.  Musculoskeletal: Positive for arthralgias, gait problem and joint swelling.  Allergic/Immunologic: Positive for environmental allergies.  Psychiatric/Behavioral: The patient is nervous/anxious.    Past Medical History:  Diagnosis Date  . Anxiety   . Arthritis   . Cancer (HCC)    cervical  . 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 Coronary artery disease   . Foot drop    left foot  . Hypertension   . Left foot drop   . 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 PONV (postoperative nausea and vomiting)   . Seizures ([place])    last one 10 years ago  . Sleep apnea   . Stroke [facility])    at age 21, left sided weakness    Past Surgical History:  Procedure Laterality Date  . BREAST SURGERY    . CERVICAL CONE BIOPSY    . COLONOSCOPY  2006   Dr. Anthony Sar: normal colonoscopy   . COLONOSCOPY WITH PROPOFOL N/A 10/06/2012   Procedure: COLONOSCOPY WITH PROPOFOL;  Surgeon: Daneil Dolin, MD;  Location: [facility];  Service: Endoscopy;  Laterality: N/A;  entered cecum @ 0905; total cecal withdrawal time=  38minutes  . DILATION AND CURETTAGE OF UTERUS    . LEG SURGERY    . LEG SURGERY     left  . OPEN REDUCTION INTERNAL FIXATION (ORIF) HAND     right hand and arm  .  POLYPECTOMY N/A 10/06/2012   Procedure: POLYPECTOMY;  Surgeon: Daneil Dolin, MD;  Location: [facility];  Service: Endoscopy;  Laterality: N/A;  hepatic flexure polyp  . TONSILLECTOMY      Current Outpatient Prescriptions on File Prior to Visit  Medication Sig Dispense Refill  . acetaminophen (TYLENOL) 500 MG tablet Take 1,000 mg by mouth every 6 (six) hours as needed for headache.    . Cholecalciferol (VITAMIN D) 2000 UNITS CAPS Take 1 capsule by mouth daily.    . cyanocobalamin (,VITAMIN B-12,) 1000 MCG/ML injection Inject 1,000 mcg into the muscle every 30 (thirty) days. 15th of each month    . diltiazem (TIAZAC) 300 MG 24 hr capsule Take 300 mg by mouth daily.    Marland Kitchen docusate sodium (COLACE) 100 MG capsule Take 100 mg by mouth 2 (two) times daily.     . DULoxetine (CYMBALTA) 60 MG capsule Take 60 mg by mouth daily.      . fluticasone (FLONASE) 50 MCG/ACT nasal spray Place 2 sprays into both nostrils daily.    . furosemide (LASIX) 20 MG tablet Take 20 mg by mouth daily. Starting on 04/09/2016, take one tablet once daily for week, then take  as needed thereafter    . gabapentin (NEURONTIN) 300 MG capsule Take 300-600 mg by mouth 3 (three) times daily. Takes 300 mg three times daily and 600 mg at bedtime.    Marland Kitchen guaiFENesin (MUCINEX) 600 MG 12 hr tablet Take 600 mg by mouth 2 (two) times daily as needed for congestion.    Marland Kitchen HYDROcodone-acetaminophen (NORCO/VICODIN) 5-325 MG per tablet Take 1-2 tablets by mouth See admin instructions. Take 1 tablet at 08:00 AM, 1 tablet at 02:00 PM, and take 2 tablets at bedtime.    . Linaclotide (LINZESS) 290 MCG CAPS Take 1 capsule by mouth daily. 30 minutes before breakfast. 30 capsule 3  . lisinopril (PRINIVIL,ZESTRIL) 40 MG tablet Take 40 mg by mouth daily.      Marland Kitchen loratadine (CLARITIN) 10 MG tablet Take 10 mg by mouth daily.    Marland Kitchen lovastatin (MEVACOR) 20 MG tablet Take 20 mg by mouth at bedtime.    Marland Kitchen omeprazole (PRILOSEC) 20 MG capsule Take 20 mg by mouth daily.        . polycarbophil (FIBER-LAX) 625 MG tablet Take 625 mg by mouth daily as needed (constipation).    . polyethylene glycol powder (MIRALAX) powder 1 scoop 1 to 4 times daily (Patient taking differently: Take 17 g by mouth 4 (four) times daily as needed for mild constipation or moderate constipation. ) 255 g 11  . prochlorperazine (COMPAZINE) 10 MG tablet Take 10 mg by mouth every 8 (eight) hours as needed for nausea.     Marland Kitchen senna (SENOKOT) 8.6 MG TABS Take 2 tablets by mouth daily.    Marland Kitchen tolterodine (DETROL) 2 MG tablet Take 2 mg by mouth 2 (two) times daily.    Marland Kitchen topiramate (TOPAMAX) 25 MG tablet Take 50 mg by mouth at bedtime.    . Vitamins A & D (VITAMIN A & D) ointment Apply 1 application topically as needed for dry skin.    Marland Kitchen warfarin (COUMADIN) 2.5 MG tablet TAKE 2 TABLETS (5mg ) BY MOUTH ON MONDAY. TAKE 1 TABLET (2.5mg ) ONCE DAILY ON ALL OTHER DAYS. (Patient taking differently: TAKE 2 TABLETS (5mg ) BY MOUTH ON MONDAY AND THURSDAY. TAKE 1 TABLET (2.5mg ) ONCE DAILY ON ALL OTHER DAYS.) 35 tablet 6   No current facility-administered medications on file prior to visit.     Social History   Social History  . Marital status: Single    Spouse name: N/A  . Number of children: N/A  . Years of education: N/A   Occupational History  . Not on file.   Social History Main Topics  . Smoking status: Former Smoker    Years: 5.00    Quit date: 03/25/1981  . Smokeless tobacco: Never Used  . Alcohol use No  . Drug use: No  . Sexual activity: No   Other Topics Concern  . Not on file   Social History Narrative  . No narrative on file    Family History  Problem Relation Age of Onset  . Diabetes Mother   . Diabetes Sister   . Cancer Brother        "all over"   . Cancer Other   . Diabetes Other   . Colon cancer Unknown        unsure    BP 123/76   Pulse 81   Temp (!) 97.1 F (36.2 C)      Objective:   Physical Exam  Constitutional: She is oriented to person, place, and time. She  appears well-developed and well-nourished.  HENT:  Head: Normocephalic and atraumatic.  Eyes: Pupils are equal, round, and reactive to light. Conjunctivae and EOM are normal.  Neck: Normal range of motion. Neck supple.  Cardiovascular: Normal rate, regular rhythm and intact distal pulses.   Pulmonary/Chest: Effort normal.  Abdominal: Soft.  Musculoskeletal: She exhibits tenderness (Right knee pain and effusion 1+. Crepitus present. ROM 0 to 105, medial joint line pain, weakly positive medial [place], prefers not to stand or walk, left knee with crepitus but no pain, ROM 0 to 115.).  Neurological: She is alert and oriented to person, place, and time. She displays normal reflexes. No cranial nerve deficit. She exhibits normal muscle tone. Coordination normal.  Skin: Skin is warm and dry.  Psychiatric: She has a normal mood and affect. Her behavior is normal. Judgment and thought content normal.  Vitals reviewed.  X-rays were done of the right knee, reported separately.       Assessment & Plan:   Encounter Diagnosis  Name Primary?  . Chronic pain of right knee Yes   PROCEDURE NOTE:  The patient requests injections of the right knee , verbal consent was obtained.  The right knee was prepped appropriately after time out was performed.   Sterile technique was observed and injection of 1 cc of Depo-Medrol 40 mg with several cc's of plain xylocaine. Anesthesia was provided by ethyl chloride and a 20-gauge needle was used to inject the knee area. The injection was tolerated well.  A band aid dressing was applied.  The patient was advised to apply ice later today and tomorrow to the injection sight as needed.  I will see her back in one month.  Call if any problem.  Precautions discussed.  Forms for rest home completed.  Electronically Signed Sanjuana Kava, MD 9/11/20189:14 AM

## 2017-04-21 ENCOUNTER — Ambulatory Visit (INDEPENDENT_AMBULATORY_CARE_PROVIDER_SITE_OTHER): Payer: Medicare Other | Admitting: *Deleted

## 2017-04-21 DIAGNOSIS — Z5181 Encounter for therapeutic drug level monitoring: Secondary | ICD-10-CM | POA: Diagnosis not present

## 2017-04-21 DIAGNOSIS — I2699 Other pulmonary embolism without acute cor pulmonale: Secondary | ICD-10-CM

## 2017-04-21 DIAGNOSIS — I4891 Unspecified atrial fibrillation: Secondary | ICD-10-CM

## 2017-04-21 LAB — POCT INR: INR: 3.4

## 2017-04-22 NOTE — Addendum Note (Signed)
Addended by: Margretta Sidle on: 04/22/2017 07:31 AM   Modules accepted: Orders

## 2017-05-05 ENCOUNTER — Ambulatory Visit (INDEPENDENT_AMBULATORY_CARE_PROVIDER_SITE_OTHER): Payer: Medicare Other | Admitting: *Deleted

## 2017-05-05 DIAGNOSIS — I4891 Unspecified atrial fibrillation: Secondary | ICD-10-CM

## 2017-05-05 DIAGNOSIS — I2699 Other pulmonary embolism without acute cor pulmonale: Secondary | ICD-10-CM

## 2017-05-05 DIAGNOSIS — Z5181 Encounter for therapeutic drug level monitoring: Secondary | ICD-10-CM

## 2017-05-05 DIAGNOSIS — I4892 Unspecified atrial flutter: Secondary | ICD-10-CM | POA: Diagnosis not present

## 2017-05-05 LAB — POCT INR: INR: 2.9

## 2017-05-19 ENCOUNTER — Ambulatory Visit (INDEPENDENT_AMBULATORY_CARE_PROVIDER_SITE_OTHER): Payer: Medicare Other | Admitting: Orthopaedic Surgery

## 2017-05-19 ENCOUNTER — Encounter: Payer: Self-pay | Admitting: Orthopaedic Surgery

## 2017-05-19 VITALS — BP 95/59 | HR 68 | Temp 98.0°F

## 2017-05-19 DIAGNOSIS — G8929 Other chronic pain: Secondary | ICD-10-CM

## 2017-05-19 DIAGNOSIS — M25561 Pain in right knee: Secondary | ICD-10-CM

## 2017-05-19 NOTE — Progress Notes (Signed)
Patient Deborah Maddox, female DOB:26-May-1956, 61 y.o. [id number]  Chief Complaint  Patient presents with  . Follow-up    right knee    HPI  Deborah Maddox is a 61 y.o. female who has continued pain of the right knee.  She did not have relief with the injection last time.  She continues to have pain and swelling.  She has no new trauma.  She cannot take any NSAID. HPI  There is no height or weight on file to calculate BMI.  ROS  Review of Systems  Past Medical History:  Diagnosis Date  . Anxiety   . Arthritis   . Cancer (HCC)    cervical  . 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 Coronary artery disease   . Foot drop    left foot  . Hypertension   . Left foot drop   . 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 PONV (postoperative nausea and vomiting)   . Seizures ([place])    last one 10 years ago  . Sleep apnea   . Stroke [facility])    at age 22, left sided weakness    Past Surgical History:  Procedure Laterality Date  . BREAST SURGERY    . CERVICAL CONE BIOPSY    . COLONOSCOPY  2006   Dr. Anthony Sar: normal colonoscopy   . COLONOSCOPY WITH PROPOFOL N/A 10/06/2012   Procedure: COLONOSCOPY WITH PROPOFOL;  Surgeon: Daneil Dolin, MD;  Location: [facility];  Service: Endoscopy;  Laterality: N/A;  entered cecum @ 0905; total cecal withdrawal time=  62minutes  . DILATION AND CURETTAGE OF UTERUS    . LEG SURGERY    . LEG SURGERY     left  . OPEN REDUCTION INTERNAL FIXATION (ORIF) HAND     right hand and arm  . POLYPECTOMY N/A 10/06/2012   Procedure: POLYPECTOMY;  Surgeon: Daneil Dolin, MD;  Location: [facility];  Service: Endoscopy;  Laterality: N/A;  hepatic flexure polyp  . TONSILLECTOMY      Family History  Problem Relation Age of Onset  . Diabetes Mother   . Diabetes Sister   . Cancer Brother        "all over"   . Cancer Other   . Diabetes Other   . Colon cancer Unknown        unsure    Social History Social History  Substance Use Topics  . Smoking  status: Former Smoker    Years: 5.00    Quit date: 03/25/1981  . Smokeless tobacco: Never Used  . Alcohol use No    Allergies  Allergen Reactions  . Latex Rash  . Other Rash    HEART  MONITOR TAPE: severe rash, bleeding    Current Outpatient Prescriptions  Medication Sig Dispense Refill  . acetaminophen (TYLENOL) 500 MG tablet Take 1,000 mg by mouth every 6 (six) hours as needed for headache.    Marland Kitchen atorvastatin (LIPITOR) 10 MG tablet Take 10 mg by mouth at bedtime.    . Cholecalciferol (VITAMIN D) 2000 UNITS CAPS Take 1 capsule by mouth daily.    . clotrimazole-betamethasone (LOTRISONE) cream Apply 1 application topically 2 (two) times daily. As needed    . cyanocobalamin (,VITAMIN B-12,) 1000 MCG/ML injection Inject 1,000 mcg into the muscle every 30 (thirty) days. 15th of each month    . diclofenac sodium (VOLTAREN) 1 % GEL Apply topically. Apply 2 to 4 grams topically TID  to knees    . diltiazem (TIAZAC) 300 MG 24 hr capsule Take 300 mg by mouth daily.    Marland Kitchen docusate sodium (COLACE) 100 MG capsule Take 100 mg by mouth 2 (two) times daily.     . DULoxetine (CYMBALTA) 60 MG capsule Take 60 mg by mouth daily.      . fluticasone (FLONASE) 50 MCG/ACT nasal spray Place 2 sprays into both nostrils daily.    . furosemide (LASIX) 20 MG tablet Take tab 1 daily prn    . gabapentin (NEURONTIN) 300 MG capsule Take 300-600 mg by mouth 3 (three) times daily. Takes 300 mg three times daily and 600 mg at bedtime.    Marland Kitchen HYDROcodone-acetaminophen (NORCO) 10-325 MG tablet Take 1 tablet at 8am and 1 tablet at 2pm and 2 tablets at bedtime    . Linaclotide (LINZESS) 290 MCG CAPS Take 1 capsule by mouth daily. 30 minutes before breakfast. 30 capsule 3  . lisinopril (PRINIVIL,ZESTRIL) 40 MG tablet Take 40 mg by mouth daily.      Marland Kitchen loratadine (CLARITIN) 10 MG tablet Take 10 mg by mouth daily.    . montelukast (SINGULAIR) 10 MG tablet Take 10 mg by mouth daily.    Marland Kitchen omeprazole (PRILOSEC) 20 MG capsule Take 20  mg by mouth daily.      Marland Kitchen oxybutynin (DITROPAN) 5 MG tablet Take 5 mg by mouth 2 (two) times daily.    Marland Kitchen senna (SENOKOT) 8.6 MG TABS Take 2 tablets by mouth daily.    Marland Kitchen topiramate (TOPAMAX) 25 MG tablet Take 50 mg by mouth at bedtime.    Marland Kitchen warfarin (COUMADIN) 2.5 MG tablet Take as directed by coumadin clinic     No current facility-administered medications for this visit.      Physical Exam  Blood pressure (!) 95/59, pulse 68, temperature 98 F (36.7 C).  Constitutional: overall normal hygiene, normal nutrition, well developed, normal grooming, normal body habitus. Assistive device:wheelchair  Musculoskeletal: gait and station Limp unable to stand, muscle tone and strength are normal, no tremors or atrophy is present.  .  Neurological: coordination overall normal.  Deep tendon reflex/nerve stretch intact.  Sensation normal.  Cranial nerves II-XII intact.   Skin:   Normal overall no scars, lesions, ulcers or rashes. No psoriasis.  Psychiatric: Alert and oriented x 3.  Recent memory intact, remote memory unclear.  Normal mood and affect. Well groomed.  Good eye contact.  Cardiovascular: overall no swelling, no varicosities, no edema bilaterally, normal temperatures of the legs and arms, no clubbing, cyanosis and good capillary refill.  Lymphatic: palpation is normal.  All other systems reviewed and are negative   The right lower extremity is examined:  Inspection:  Thigh:  Non-tender and no defects  Knee has swelling 1+ effusion.                        Joint tenderness is present                        Patient is tender over the medial joint line  Lower Leg:  Has normal appearance and no tenderness or defects  Ankle:  Non-tender and no defects  Foot:  Non-tender and no defects Range of Motion:  Knee:  Range of motion is: 0-95                        Crepitus is  present  Ankle:  Range of motion is normal. Strength and Tone:  The right lower extremity has normal strength  and tone. Stability:  Knee:  The knee is stable.  Ankle:  The ankle is stable.   The patient has been educated about the nature of the problem(s) and counseled on treatment options.  The patient appeared to understand what I have discussed and is in agreement with it.  Encounter Diagnosis  Name Primary?  . Chronic pain of right knee Yes    PLAN Call if any problems.  Precautions discussed.  Continue current medications.   Return to clinic after MRI of the right knee   Electronically Signed Sanjuana Kava, MD 10/10/20183:09 PM

## 2017-05-26 ENCOUNTER — Ambulatory Visit (HOSPITAL_COMMUNITY)
Admission: RE | Admit: 2017-05-26 | Discharge: 2017-05-26 | Disposition: A | Discharge status: Home / Self Care | Payer: Medicare Other | Source: Ambulatory Visit | Attending: Orthopaedic Surgery | Admitting: Orthopaedic Surgery

## 2017-05-26 DIAGNOSIS — S83241A Other tear of medial meniscus, current injury, right knee, initial encounter: Secondary | ICD-10-CM | POA: Insufficient documentation

## 2017-05-26 DIAGNOSIS — M25561 Pain in right knee: Secondary | ICD-10-CM | POA: Insufficient documentation

## 2017-05-26 DIAGNOSIS — X58XXXA Exposure to other specified factors, initial encounter: Secondary | ICD-10-CM | POA: Diagnosis not present

## 2017-05-26 DIAGNOSIS — M1711 Unilateral primary osteoarthritis, right knee: Secondary | ICD-10-CM | POA: Diagnosis not present

## 2017-05-26 DIAGNOSIS — G8929 Other chronic pain: Secondary | ICD-10-CM | POA: Diagnosis present

## 2017-06-01 ENCOUNTER — Encounter: Payer: Self-pay | Admitting: Orthopaedic Surgery

## 2017-06-01 ENCOUNTER — Ambulatory Visit (INDEPENDENT_AMBULATORY_CARE_PROVIDER_SITE_OTHER): Payer: Medicare Other | Admitting: Orthopaedic Surgery

## 2017-06-01 VITALS — BP 133/74 | HR 91 | Temp 97.8°F | Ht 65.0 in

## 2017-06-01 DIAGNOSIS — G8929 Other chronic pain: Secondary | ICD-10-CM | POA: Diagnosis not present

## 2017-06-01 DIAGNOSIS — M21379 Foot drop, unspecified foot: Secondary | ICD-10-CM | POA: Insufficient documentation

## 2017-06-01 DIAGNOSIS — M25561 Pain in right knee: Secondary | ICD-10-CM

## 2017-06-01 DIAGNOSIS — M21372 Foot drop, left foot: Secondary | ICD-10-CM

## 2017-06-01 NOTE — Progress Notes (Signed)
Patient Deborah Maddox, female DOB:19-Jun-1956, 61 y.o. [id number]  Chief Complaint  Patient presents with  . Results    MRI Right Knee    HPI  Deborah Maddox is a 61 y.o. female who has right knee pain.  She is in a rest home.  She does not walk much.  She has an AFO on the left ankle.  She has less pain in the right knee.  She had a MRI which showed: IMPRESSION: 1. 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 2. Tricompartmental osteoarthritis, severe within the patellofemoral compartment. Large intra-articular loose bodies are again noted as seen on prior radiographs. These may be the sequela of prior trauma or CPPD arthropathy.  I have explained the findings to her.  She is candidate for total knee on the right but she needs to lose weight first.  She has heart disease and is on Coumadin.  I have recommended more physical activity and lose the weight first. Forms for rest home completed. HPI  There is no height or weight on file to calculate BMI.  ROS  Review of Systems  HENT: Negative for congestion.   Respiratory: Negative for cough and shortness of breath.   Cardiovascular: Negative for chest pain and leg swelling.  Endocrine: Positive for cold intolerance.  Musculoskeletal: Positive for arthralgias, gait problem and joint swelling.  Allergic/Immunologic: Positive for environmental allergies.  Psychiatric/Behavioral: The patient is nervous/anxious.     Past Medical History:  Diagnosis Date  . Anxiety   . Arthritis   . Cancer (HCC)    cervical  . 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 Coronary artery disease   . Foot drop    left foot  . Hypertension   . Left foot drop   . 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 PONV (postoperative nausea and vomiting)   . Seizures ([place])    last one 10 years ago  . Sleep apnea   . Stroke [facility])    at age 56, left sided weakness    Past Surgical History:  Procedure Laterality  Date  . BREAST SURGERY    . CERVICAL CONE BIOPSY    . COLONOSCOPY  2006   Dr. Anthony Sar: normal colonoscopy   . COLONOSCOPY WITH PROPOFOL N/A 10/06/2012   Procedure: COLONOSCOPY WITH PROPOFOL;  Surgeon: Daneil Dolin, MD;  Location: [facility];  Service: Endoscopy;  Laterality: N/A;  entered cecum @ 0905; total cecal withdrawal time=  48minutes  . DILATION AND CURETTAGE OF UTERUS    . LEG SURGERY    . LEG SURGERY     left  . OPEN REDUCTION INTERNAL FIXATION (ORIF) HAND     right hand and arm  . POLYPECTOMY N/A 10/06/2012   Procedure: POLYPECTOMY;  Surgeon: Daneil Dolin, MD;  Location: [facility];  Service: Endoscopy;  Laterality: N/A;  hepatic flexure polyp  . TONSILLECTOMY      Family History  Problem Relation Age of Onset  . Diabetes Mother   . Diabetes Sister   . Cancer Brother        "all over"   . Cancer Other   . Diabetes Other   . Colon cancer Unknown        unsure    Social History Social History  Substance Use Topics  . Smoking status: Former Smoker    Years: 5.00    Quit date: 03/25/1981  .  Smokeless tobacco: Never Used  . Alcohol use No    Allergies  Allergen Reactions  . Latex Rash  . Other Rash    HEART  MONITOR TAPE: severe rash, bleeding    Current Outpatient Prescriptions  Medication Sig Dispense Refill  . acetaminophen (TYLENOL) 500 MG tablet Take 1,000 mg by mouth every 6 (six) hours as needed for headache.    Marland Kitchen atorvastatin (LIPITOR) 10 MG tablet Take 10 mg by mouth at bedtime.    . Cholecalciferol (VITAMIN D) 2000 UNITS CAPS Take 1 capsule by mouth daily.    . clotrimazole-betamethasone (LOTRISONE) cream Apply 1 application topically 2 (two) times daily. As needed    . cyanocobalamin (,VITAMIN B-12,) 1000 MCG/ML injection Inject 1,000 mcg into the muscle every 30 (thirty) days. 15th of each month    . diclofenac sodium (VOLTAREN) 1 % GEL Apply topically. Apply 2 to 4 grams topically TID to knees    . diltiazem (TIAZAC) 300 MG 24 hr capsule Take 300 mg  by mouth daily.    Marland Kitchen docusate sodium (COLACE) 100 MG capsule Take 100 mg by mouth 2 (two) times daily.     . DULoxetine (CYMBALTA) 60 MG capsule Take 60 mg by mouth daily.      . fluticasone (FLONASE) 50 MCG/ACT nasal spray Place 2 sprays into both nostrils daily.    . furosemide (LASIX) 20 MG tablet Take tab 1 daily prn    . gabapentin (NEURONTIN) 300 MG capsule Take 300-600 mg by mouth 3 (three) times daily. Takes 300 mg three times daily and 600 mg at bedtime.    Marland Kitchen HYDROcodone-acetaminophen (NORCO) 10-325 MG tablet Take 1 tablet at 8am and 1 tablet at 2pm and 2 tablets at bedtime    . Linaclotide (LINZESS) 290 MCG CAPS Take 1 capsule by mouth daily. 30 minutes before breakfast. 30 capsule 3  . lisinopril (PRINIVIL,ZESTRIL) 40 MG tablet Take 40 mg by mouth daily.      Marland Kitchen loratadine (CLARITIN) 10 MG tablet Take 10 mg by mouth daily.    . montelukast (SINGULAIR) 10 MG tablet Take 10 mg by mouth daily.    Marland Kitchen omeprazole (PRILOSEC) 20 MG capsule Take 20 mg by mouth daily.      Marland Kitchen oxybutynin (DITROPAN) 5 MG tablet Take 5 mg by mouth 2 (two) times daily.    Marland Kitchen senna (SENOKOT) 8.6 MG TABS Take 2 tablets by mouth daily.    Marland Kitchen topiramate (TOPAMAX) 25 MG tablet Take 50 mg by mouth at bedtime.    Marland Kitchen warfarin (COUMADIN) 2.5 MG tablet Take as directed by coumadin clinic     No current facility-administered medications for this visit.      Physical Exam  Blood pressure 133/74, pulse 91, temperature 97.8 F (36.6 C), height 5\' 5"  (1.651 m).  Constitutional: overall normal hygiene, normal nutrition, well developed, normal grooming, normal body habitus. Assistive device:wheelchair, AFO left  Musculoskeletal: gait and station Limp not able to stand today, muscle tone and strength are normal, no tremors or atrophy is present.  .  Neurological: coordination overall normal.  Deep tendon reflex/nerve stretch intact.  Sensation normal.  Cranial nerves II-XII intact.   Skin:   Normal overall no scars, lesions,  ulcers or rashes. No psoriasis.  Psychiatric: Alert and oriented x 3.  Recent memory intact, remote memory unclear.  Normal mood and affect. Well groomed.  Good eye contact.  Cardiovascular: overall she has bilateral edema of both legs with decreased distal pulses.  Lymphatic: palpation  is normal.  All other systems reviewed and are negative   Right knee has effusion 2+, crepitus, medial joint line pain, ROM 0 to 95 with pain, no redness.  AFO on left lower leg.  The patient has been educated about the nature of the problem(s) and counseled on treatment options.  The patient appeared to understand what I have discussed and is in agreement with it.  Encounter Diagnoses  Name Primary?  . Chronic pain of right knee Yes  . Left foot drop   . Class 3 severe obesity due to excess calories in adult, unspecified BMI, unspecified whether serious comorbidity present [facility])     PLAN Call if any problems.  Precautions discussed.  Continue current medications.   Return to clinic 2 months   Electronically Signed Sanjuana Kava, MD 10/23/20189:51 AM

## 2017-06-02 ENCOUNTER — Ambulatory Visit (INDEPENDENT_AMBULATORY_CARE_PROVIDER_SITE_OTHER): Payer: Medicare Other | Admitting: *Deleted

## 2017-06-02 DIAGNOSIS — I2699 Other pulmonary embolism without acute cor pulmonale: Secondary | ICD-10-CM

## 2017-06-02 DIAGNOSIS — Z5181 Encounter for therapeutic drug level monitoring: Secondary | ICD-10-CM

## 2017-06-02 DIAGNOSIS — I4891 Unspecified atrial fibrillation: Secondary | ICD-10-CM

## 2017-06-02 LAB — POCT INR: INR: 3.1

## 2017-06-30 ENCOUNTER — Ambulatory Visit (INDEPENDENT_AMBULATORY_CARE_PROVIDER_SITE_OTHER): Payer: Medicare Other | Admitting: *Deleted

## 2017-06-30 DIAGNOSIS — I2699 Other pulmonary embolism without acute cor pulmonale: Secondary | ICD-10-CM | POA: Diagnosis not present

## 2017-06-30 DIAGNOSIS — Z5181 Encounter for therapeutic drug level monitoring: Secondary | ICD-10-CM

## 2017-06-30 DIAGNOSIS — I4891 Unspecified atrial fibrillation: Secondary | ICD-10-CM | POA: Diagnosis not present

## 2017-06-30 LAB — POCT INR: INR: 2.9

## 2017-07-29 ENCOUNTER — Ambulatory Visit (INDEPENDENT_AMBULATORY_CARE_PROVIDER_SITE_OTHER): Payer: Medicare Other | Admitting: Orthopaedic Surgery

## 2017-07-29 ENCOUNTER — Encounter: Payer: Self-pay | Admitting: Orthopaedic Surgery

## 2017-07-29 VITALS — BP 133/79 | HR 87 | Temp 98.1°F

## 2017-07-29 DIAGNOSIS — M25561 Pain in right knee: Secondary | ICD-10-CM | POA: Diagnosis not present

## 2017-07-29 DIAGNOSIS — G8929 Other chronic pain: Secondary | ICD-10-CM | POA: Diagnosis not present

## 2017-07-29 DIAGNOSIS — M21372 Foot drop, left foot: Secondary | ICD-10-CM

## 2017-07-29 NOTE — Progress Notes (Signed)
CC:  I have pain of my right knee. I would like an injection.  The patient has chronic pain of the right knee.  There is no recent trauma.  There is no redness.  Injections in the past have helped.  The knee has no redness, has an effusion and crepitus present.  ROM of the right knee is 0-100.  Impression:  Chronic knee pain right  Return: 6 weeks  PROCEDURE NOTE:  The patient requests injections of the right knee , verbal consent was obtained.  The right knee was prepped appropriately after time out was performed.   Sterile technique was observed and injection of 1 cc of Depo-Medrol 40 mg with several cc's of plain xylocaine. Anesthesia was provided by ethyl chloride and a 20-gauge needle was used to inject the knee area. The injection was tolerated well.  A band aid dressing was applied.  The patient was advised to apply ice later today and tomorrow to the injection sight as needed.  Electronically Signed Sanjuana Kava, MD 12/20/20189:59 AM

## 2017-08-04 ENCOUNTER — Encounter: Payer: Self-pay | Admitting: *Deleted

## 2017-08-04 ENCOUNTER — Ambulatory Visit (INDEPENDENT_AMBULATORY_CARE_PROVIDER_SITE_OTHER): Payer: Medicare Other | Admitting: *Deleted

## 2017-08-04 DIAGNOSIS — I4891 Unspecified atrial fibrillation: Secondary | ICD-10-CM | POA: Diagnosis not present

## 2017-08-04 DIAGNOSIS — I2699 Other pulmonary embolism without acute cor pulmonale: Secondary | ICD-10-CM | POA: Diagnosis not present

## 2017-08-04 DIAGNOSIS — Z5181 Encounter for therapeutic drug level monitoring: Secondary | ICD-10-CM

## 2017-08-04 LAB — POCT INR: INR: 2.5

## 2017-09-07 ENCOUNTER — Encounter: Payer: Self-pay | Admitting: Internal Medicine

## 2017-09-09 ENCOUNTER — Encounter: Payer: Self-pay | Admitting: Orthopaedic Surgery

## 2017-09-09 ENCOUNTER — Ambulatory Visit (INDEPENDENT_AMBULATORY_CARE_PROVIDER_SITE_OTHER): Payer: Medicare Other | Admitting: Orthopaedic Surgery

## 2017-09-09 VITALS — BP 125/77 | HR 73 | Temp 97.9°F | Ht 65.0 in

## 2017-09-09 DIAGNOSIS — M25561 Pain in right knee: Secondary | ICD-10-CM

## 2017-09-09 DIAGNOSIS — G8929 Other chronic pain: Secondary | ICD-10-CM

## 2017-09-09 NOTE — Progress Notes (Signed)
CC:  I have pain of my right knee. I would like an injection.  The patient has chronic pain of the right knee.  There is no recent trauma.  There is no redness.  Injections in the past have helped.  The knee has no redness, has an effusion and crepitus present.  ROM of the right knee is 0-100.  Impression:  Chronic knee pain right  Return: 1 month  PROCEDURE NOTE:  The patient requests injections of the right knee , verbal consent was obtained.  The right knee was prepped appropriately after time out was performed.   Sterile technique was observed and injection of 1 cc of Depo-Medrol 40 mg with several cc's of plain xylocaine. Anesthesia was provided by ethyl chloride and a 20-gauge needle was used to inject the knee area. The injection was tolerated well.  A band aid dressing was applied.  The patient was advised to apply ice later today and tomorrow to the injection sight as needed.  Form for nursing home completed.  Call if any problem.  Precautions discussed.   Electronically Signed Sanjuana Kava, MD 1/31/201910:30 AM

## 2017-09-15 ENCOUNTER — Ambulatory Visit (INDEPENDENT_AMBULATORY_CARE_PROVIDER_SITE_OTHER): Payer: Medicare Other | Admitting: *Deleted

## 2017-09-15 DIAGNOSIS — Z5181 Encounter for therapeutic drug level monitoring: Secondary | ICD-10-CM

## 2017-09-15 DIAGNOSIS — I4891 Unspecified atrial fibrillation: Secondary | ICD-10-CM

## 2017-09-15 DIAGNOSIS — I2699 Other pulmonary embolism without acute cor pulmonale: Secondary | ICD-10-CM

## 2017-09-15 LAB — POCT INR: INR: 1.9

## 2017-09-15 NOTE — Patient Instructions (Signed)
Take coumadin 1 1/2 tablets tonight (3.75mg ) then resume 1 tablet  (2.5mg ) daily except 2 tablets (5mg )  on Mondays and Thursdays Recheck in 6 weeks

## 2017-09-27 ENCOUNTER — Inpatient Hospital Stay (HOSPITAL_COMMUNITY)
Admission: EM | Admit: 2017-09-27 | Discharge: 2017-10-01 | DRG: 603 | Disposition: A | Discharge status: Skilled Nursing Facility | Payer: Medicare Other | Attending: Internal Medicine | Admitting: Internal Medicine

## 2017-09-27 ENCOUNTER — Encounter (HOSPITAL_COMMUNITY): Payer: Self-pay | Admitting: Emergency Medicine

## 2017-09-27 ENCOUNTER — Emergency Department (HOSPITAL_COMMUNITY): Payer: Medicare Other

## 2017-09-27 ENCOUNTER — Other Ambulatory Visit: Payer: Self-pay

## 2017-09-27 DIAGNOSIS — M1990 Unspecified osteoarthritis, unspecified site: Secondary | ICD-10-CM | POA: Diagnosis present

## 2017-09-27 DIAGNOSIS — Z91040 Latex allergy status: Secondary | ICD-10-CM

## 2017-09-27 DIAGNOSIS — R569 Unspecified convulsions: Secondary | ICD-10-CM

## 2017-09-27 DIAGNOSIS — G40909 Epilepsy, unspecified, not intractable, without status epilepticus: Secondary | ICD-10-CM | POA: Diagnosis present

## 2017-09-27 DIAGNOSIS — Z79899 Other long term (current) drug therapy: Secondary | ICD-10-CM | POA: Diagnosis not present

## 2017-09-27 DIAGNOSIS — I4891 Unspecified atrial fibrillation: Secondary | ICD-10-CM | POA: Diagnosis present

## 2017-09-27 DIAGNOSIS — F419 Anxiety disorder, unspecified: Secondary | ICD-10-CM | POA: Diagnosis present

## 2017-09-27 DIAGNOSIS — I69354 Hemiplegia and hemiparesis following cerebral infarction affecting left non-dominant side: Secondary | ICD-10-CM

## 2017-09-27 DIAGNOSIS — M545 Low back pain, unspecified: Secondary | ICD-10-CM | POA: Diagnosis present

## 2017-09-27 DIAGNOSIS — K5900 Constipation, unspecified: Secondary | ICD-10-CM | POA: Diagnosis not present

## 2017-09-27 DIAGNOSIS — Z881 Allergy status to other antibiotic agents status: Secondary | ICD-10-CM

## 2017-09-27 DIAGNOSIS — M21372 Foot drop, left foot: Secondary | ICD-10-CM | POA: Diagnosis present

## 2017-09-27 DIAGNOSIS — Q210 Ventricular septal defect: Secondary | ICD-10-CM | POA: Diagnosis not present

## 2017-09-27 DIAGNOSIS — G4733 Obstructive sleep apnea (adult) (pediatric): Secondary | ICD-10-CM | POA: Diagnosis not present

## 2017-09-27 DIAGNOSIS — L02211 Cutaneous abscess of abdominal wall: Secondary | ICD-10-CM | POA: Diagnosis present

## 2017-09-27 DIAGNOSIS — Z87891 Personal history of nicotine dependence: Secondary | ICD-10-CM | POA: Diagnosis not present

## 2017-09-27 DIAGNOSIS — Z5329 Procedure and treatment not carried out because of patient's decision for other reasons: Secondary | ICD-10-CM | POA: Diagnosis not present

## 2017-09-27 DIAGNOSIS — I4892 Unspecified atrial flutter: Secondary | ICD-10-CM | POA: Diagnosis present

## 2017-09-27 DIAGNOSIS — G8929 Other chronic pain: Secondary | ICD-10-CM | POA: Diagnosis present

## 2017-09-27 DIAGNOSIS — Z6841 Body Mass Index (BMI) 40.0 and over, adult: Secondary | ICD-10-CM | POA: Diagnosis not present

## 2017-09-27 DIAGNOSIS — G4730 Sleep apnea, unspecified: Secondary | ICD-10-CM | POA: Diagnosis present

## 2017-09-27 DIAGNOSIS — Z7901 Long term (current) use of anticoagulants: Secondary | ICD-10-CM

## 2017-09-27 DIAGNOSIS — Z9989 Dependence on other enabling machines and devices: Secondary | ICD-10-CM

## 2017-09-27 DIAGNOSIS — M5440 Lumbago with sciatica, unspecified side: Secondary | ICD-10-CM | POA: Diagnosis not present

## 2017-09-27 DIAGNOSIS — Z8673 Personal history of transient ischemic attack (TIA), and cerebral infarction without residual deficits: Secondary | ICD-10-CM | POA: Diagnosis not present

## 2017-09-27 DIAGNOSIS — L03311 Cellulitis of abdominal wall: Principal | ICD-10-CM | POA: Diagnosis present

## 2017-09-27 DIAGNOSIS — I10 Essential (primary) hypertension: Secondary | ICD-10-CM | POA: Diagnosis present

## 2017-09-27 DIAGNOSIS — B9562 Methicillin resistant Staphylococcus aureus infection as the cause of diseases classified elsewhere: Secondary | ICD-10-CM | POA: Diagnosis present

## 2017-09-27 DIAGNOSIS — I2510 Atherosclerotic heart disease of native coronary artery without angina pectoris: Secondary | ICD-10-CM | POA: Diagnosis present

## 2017-09-27 DIAGNOSIS — Z22322 Carrier or suspected carrier of Methicillin resistant Staphylococcus aureus: Secondary | ICD-10-CM

## 2017-09-27 DIAGNOSIS — Z91048 Other nonmedicinal substance allergy status: Secondary | ICD-10-CM

## 2017-09-27 LAB — CBC WITH DIFFERENTIAL/PLATELET
BASOS ABS: 0.1 10*3/uL (ref 0.0–0.1)
Basophils Relative: 0 %
EOS ABS: 0.2 10*3/uL (ref 0.0–0.7)
EOS PCT: 2 %
HCT: 43 % (ref 36.0–46.0)
HEMOGLOBIN: 13.6 g/dL (ref 12.0–15.0)
Lymphocytes Relative: 21 %
Lymphs Abs: 2.3 10*3/uL (ref 0.7–4.0)
MCH: 30.6 pg (ref 26.0–34.0)
MCHC: 31.6 g/dL (ref 30.0–36.0)
MCV: 96.8 fL (ref 78.0–100.0)
Monocytes Absolute: 1.5 10*3/uL — ABNORMAL HIGH (ref 0.1–1.0)
Monocytes Relative: 13 %
NEUTROS PCT: 64 %
Neutro Abs: 7.2 10*3/uL (ref 1.7–7.7)
PLATELETS: 289 10*3/uL (ref 150–400)
RBC: 4.44 MIL/uL (ref 3.87–5.11)
RDW: 15.7 % — ABNORMAL HIGH (ref 11.5–15.5)
WBC: 11.2 10*3/uL — AB (ref 4.0–10.5)

## 2017-09-27 LAB — BASIC METABOLIC PANEL
ANION GAP: 9 (ref 5–15)
BUN: 15 mg/dL (ref 6–20)
CO2: 23 mmol/L (ref 22–32)
Calcium: 8.6 mg/dL — ABNORMAL LOW (ref 8.9–10.3)
Chloride: 108 mmol/L (ref 101–111)
Creatinine, Ser: 0.62 mg/dL (ref 0.44–1.00)
Glucose, Bld: 124 mg/dL — ABNORMAL HIGH (ref 65–99)
POTASSIUM: 3.9 mmol/L (ref 3.5–5.1)
SODIUM: 140 mmol/L (ref 135–145)

## 2017-09-27 MED ORDER — CLINDAMYCIN PHOSPHATE 900 MG/50ML IV SOLN
900.0000 mg | Freq: Three times a day (TID) | INTRAVENOUS | Status: DC
  Administered 2017-09-28 (×2): 900 mg via INTRAVENOUS
  Filled 2017-09-27 (×4): qty 50

## 2017-09-27 MED ORDER — DIPHENHYDRAMINE HCL 50 MG/ML IJ SOLN
12.5000 mg | Freq: Once | INTRAMUSCULAR | Status: AC
  Administered 2017-09-27: 12.5 mg via INTRAVENOUS
  Filled 2017-09-27: qty 1

## 2017-09-27 MED ORDER — VANCOMYCIN HCL 10 G IV SOLR
2000.0000 mg | Freq: Once | INTRAVENOUS | Status: AC
  Administered 2017-09-27: 2000 mg via INTRAVENOUS
  Filled 2017-09-27 (×2): qty 2000

## 2017-09-27 NOTE — H&P (Signed)
History and Physical    Deborah Maddox STM:[id number] DOB: 1956-03-25 DOA: 09/27/2017  PCP: Jani Gravel, MD   Patient coming from: Home.  I have personally briefly reviewed patient's old medical records in [place]  Chief Complaint: "Back pain and belly infection"  HPI: Deborah Maddox is a 62 y.o. female with medical history significant of anxiety, osteoarthritis, cervical cancer, ventricular septal defect, CAD, left foot drop, Hypertension, seizures (last one many years ago), sleep apnea on CPAP, history of stroke at age 70 with left-sided weakness who is coming to the emergency department with complaints of 2-week history of left-sided lower back pain radiating to her lower extremity and right lower quadrant abscess minimally tender, with calor and surrounding erythema.  She denies fever, chills, nausea, emesis, fatigue or malaise.  No runny nose, sore throat, dyspnea, wheezing, hemoptysis, chest pain, palpitations, dizziness, diaphoresis, PND orthopnea.  Denies abdominal pain, diarrhea, constipation, melena or hematochezia.  Denies dysuria, frequency or hematuria.  ED Course: Initial vital signs temperature 36.7C (98.1 F), pulse 106, respirations 21, blood pressure 151/96 mmHg and O2 sat 96% on room air.  Her white count was 11.2 with a normal differential, hemoglobin 13.6 g/dL and platelets 289.  Her basic metabolic profile shows a glucose of 124 and calcium of 8.6 mg/dL.  All other values are within normal limits.  Left hip x-ray shows lower lumbar degenerative facet arthropathy.  Please see images and full radiology report for further detail.  She was started on vancomycin initially in the emergency department, but this had to be suspended, since the patient developed a rash.  Clindamycin was subsequently ordered.  Review of Systems: As per HPI otherwise 10 point review of systems negative.    Past Medical History:  Diagnosis Date  . Anxiety   . Arthritis   . Cancer (HCC)    cervical  . 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 Coronary artery disease   . Foot drop    left foot  . Hypertension   . Left foot drop   . 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 PONV (postoperative nausea and vomiting)   . Seizures ([place])    last one 10 years ago  . Sleep apnea   . Stroke [facility])    at age 59, left sided weakness    Past Surgical History:  Procedure Laterality Date  . BREAST SURGERY    . CERVICAL CONE BIOPSY    . COLONOSCOPY  2006   Dr. Anthony Sar: normal colonoscopy   . COLONOSCOPY WITH PROPOFOL N/A 10/06/2012   Procedure: COLONOSCOPY WITH PROPOFOL;  Surgeon: Daneil Dolin, MD;  Location: [facility];  Service: Endoscopy;  Laterality: N/A;  entered cecum @ 0905; total cecal withdrawal time=  78minutes  . DILATION AND CURETTAGE OF UTERUS    . LEG SURGERY    . LEG SURGERY     left  . OPEN REDUCTION INTERNAL FIXATION (ORIF) HAND     right hand and arm  . POLYPECTOMY N/A 10/06/2012   Procedure: POLYPECTOMY;  Surgeon: Daneil Dolin, MD;  Location: [facility];  Service: Endoscopy;  Laterality: N/A;  hepatic flexure polyp  . TONSILLECTOMY       reports that she quit smoking about 36 years ago. She quit after 5.00 years of use. she has never used smokeless tobacco. She reports that she does not drink alcohol or use drugs.  Allergies  Allergen Reactions  .  Vancomycin Hives and Itching  . Latex Rash  . Other Rash    HEART  MONITOR TAPE: severe rash, bleeding    Family History  Problem Relation Age of Onset  . Diabetes Mother   . Diabetes Sister   . Cancer Brother        "all over"   . Cancer Other   . Diabetes Other   . Colon cancer Unknown        unsure    Prior to Admission medications   Medication Sig Start Date End Date Taking? Authorizing Provider  atorvastatin (LIPITOR) 10 MG tablet Take 10 mg by mouth at bedtime.   Yes [provider]  Cholecalciferol (VITAMIN D) 2000 UNITS CAPS Take 1 capsule by mouth daily.   Yes [provider]  cyanocobalamin (,VITAMIN B-12,) 1000 MCG/ML injection Inject 1,000 mcg into the muscle every 30 (thirty) days. 15th of each month   Yes [provider]  diclofenac sodium (VOLTAREN) 1 % GEL Apply 2-4 g topically 3 (three) times daily. to knees   Yes [provider]  diltiazem (TIAZAC) 300 MG 24 hr capsule Take 300 mg by mouth daily.   Yes [provider]  docusate sodium (COLACE) 100 MG capsule Take 100 mg by mouth 2 (two) times daily.    Yes [provider]  DULoxetine (CYMBALTA) 60 MG capsule Take 60 mg by mouth daily.     Yes [provider]  fluticasone (FLONASE) 50 MCG/ACT nasal spray Place 2 sprays into both nostrils daily.   Yes [provider]  furosemide (LASIX) 20 MG tablet Take 20 mg by mouth daily as needed for fluid.    Yes [provider]  gabapentin (NEURONTIN) 300 MG capsule Take 300-600 mg by mouth See admin instructions. Takes 300 mg three times daily and 600 mg at bedtime.    Yes [provider]  Linaclotide (LINZESS) 290 MCG CAPS Take 1 capsule by mouth daily. 30 minutes before breakfast. 10/13/12  Yes Annitta Needs, NP  lisinopril (PRINIVIL,ZESTRIL) 40 MG tablet Take 40 mg by mouth daily.     Yes [provider]  loratadine (CLARITIN) 10 MG tablet Take 10 mg by mouth daily.   Yes [provider]  montelukast (SINGULAIR) 10 MG tablet Take 10 mg by mouth daily.   Yes [provider]  omeprazole (PRILOSEC) 20 MG capsule Take 20 mg by mouth daily.     Yes [provider]  oxybutynin (DITROPAN) 5 MG tablet Take 5 mg by mouth 2 (two) times daily.   Yes [provider]  senna (SENOKOT) 8.6 MG TABS Take 2 tablets by mouth daily.   Yes [provider]  topiramate (TOPAMAX) 50 MG tablet Take 50 mg by mouth 2 (two) times daily.    Yes [provider]  warfarin (COUMADIN) 2.5 MG tablet Take 2.5-5 mg by mouth See admin instructions. 5mg  on  Mondays and Thursdays only. 2.5mg  on all other days at 4pm   Yes [provider]  clotrimazole-betamethasone (LOTRISONE) cream Apply 1 application topically 2 (two) times daily as needed.     [provider]    Physical Exam: Vitals:   09/27/17 1904 09/27/17 1905 09/27/17 2050 09/27/17 2336  BP: (!) 151/96  (!) 131/58 133/89  Pulse: (!) 106  90 97  Resp: (!) 21  18 18   Temp: 98.1 F (36.7 C)     SpO2: 96%  100% 98%  Weight:  115.2 kg (254 lb)  Height:  5\' 5"  (1.651 m)      Constitutional: NAD, calm, comfortable Eyes: PERRL, lids and conjunctivae normal ENMT: Mucous membranes are moist. Posterior pharynx clear of any exudate or lesions. Neck: normal, supple, no masses, no thyromegaly Respiratory: Decreased breath sounds on bases, otherwise clear to auscultation bilaterally, no wheezing, no crackles. Normal respiratory effort. No accessory muscle use.  Cardiovascular: Regular rate and rhythm, no murmurs / rubs / gallops. No extremity edema. 2+ pedal pulses. No carotid bruits.  Abdomen: Obese, no tenderness, no masses palpated. No hepatosplenomegaly. Bowel sounds positive.  Musculoskeletal: no clubbing / cyanosis.  Positive tenderness on left lumbar paraspinal area, mild LLE decrease in ROM, no contractures. Normal muscle tone.  Skin: Right lower quadrant area abscesses with calor, mild tenderness and surrounding erythema.  Please see pictures below. Neurologic: CN 2-12 grossly intact. Sensation intact, DTR normal.  LUE hemiparesis, LLE 3.5/5 strength of with left foot drop.  Psychiatric: Normal judgment and insight. Alert and oriented x 3. Normal mood.         Labs on Admission: I have personally reviewed following labs and imaging studies  CBC: Recent Labs  Lab 09/27/17 2203  WBC 11.2*  NEUTROABS 7.2  HGB 13.6  HCT 43.0  MCV 96.8  PLT 892   Basic Metabolic Panel: Recent Labs  Lab 09/27/17 2330  NA 140  K 3.9  CL 108  CO2 23  GLUCOSE 124*    BUN 15  CREATININE 0.62  CALCIUM 8.6*   GFR: Estimated Creatinine Clearance: 90.1 mL/min (by C-G formula based on SCr of 0.62 mg/dL). Liver Function Tests: No results for input(s): AST, ALT, ALKPHOS, BILITOT, PROT, ALBUMIN in the last 168 hours. No results for input(s): LIPASE, AMYLASE in the last 168 hours. No results for input(s): AMMONIA in the last 168 hours. Coagulation Profile: No results for input(s): INR, PROTIME in the last 168 hours. Cardiac Enzymes: No results for input(s): CKTOTAL, CKMB, CKMBINDEX, TROPONINI in the last 168 hours. BNP (last 3 results) No results for input(s): PROBNP in the last 8760 hours. HbA1C: No results for input(s): HGBA1C in the last 72 hours. CBG: No results for input(s): GLUCAP in the last 168 hours. Lipid Profile: No results for input(s): CHOL, HDL, LDLCALC, TRIG, CHOLHDL, LDLDIRECT in the last 72 hours. Thyroid Function Tests: No results for input(s): TSH, T4TOTAL, FREET4, T3FREE, THYROIDAB in the last 72 hours. Anemia Panel: No results for input(s): VITAMINB12, FOLATE, FERRITIN, TIBC, IRON, RETICCTPCT in the last 72 hours. Urine analysis:    Component Value Date/Time   COLORURINE YELLOW 05/31/2015 [place] 05/31/2015 0235   LABSPEC <1.005 (L) 05/31/2015 0235   PHURINE 6.5 05/31/2015 0235   GLUCOSEU NEGATIVE 05/31/2015 0235   HGBUR NEGATIVE 05/31/2015 0235   BILIRUBINUR NEGATIVE 05/31/2015 0235   KETONESUR NEGATIVE 05/31/2015 0235   PROTEINUR NEGATIVE 05/31/2015 0235   UROBILINOGEN 0.2 05/31/2015 0235   NITRITE NEGATIVE 05/31/2015 0235   LEUKOCYTESUR NEGATIVE 05/31/2015 0235    Radiological Exams on Admission: Dg Hip Unilat W Or Wo Pelvis 2-3 Views Left  Result Date: 09/27/2017 CLINICAL DATA:  Left posterior hip pain x1 week without known injury. EXAM: DG HIP (WITH OR WITHOUT PELVIS) 2-3V LEFT COMPARISON:  None. FINDINGS: AP view of the pelvis as well as AP and frog-leg views of the left hip are provided. No acute  displaced fracture or joint dislocation is seen. There is lower lumbar facet arthropathy at L4-5 and L5-S1. Bony pelvis appears intact. No suspicious osseous lesions.  No soft tissue mass or mineralization. IMPRESSION: No acute osseous abnormality of the bony pelvis and either hip. Lower lumbar degenerative facet arthropathy. Electronically Signed   By: Ashley Royalty M.D.   On: 09/27/2017 22:52    EKG: Independently reviewed.  Assessment/Plan Principal Problem:   Cellulitis of abdominal wall She denies any insect bites or trauma to the area. Admit to MedSurg/inpatient. She was unable to tolerate vancomycin earlier. Started on clindamycin 900 mg IVPB every 8 hours. Currently indurated, but no significant fluctuance. I do not believe that there is enough pause in the area to warrant I&D at this time. She is minimally tender in the area. May consider trying warm compresses if needed.  Active Problems:   Lower back pain After reviewing her imaging, this is likely due to worsening DJD. Due to the patient body habitus, [facility] MRI scanner does not have the capability to obtain lumbar sacral spine.  May consider plain films, but I doubt that this will reveal something significant.  Already on Cymbalta and gabapentin for chronic neuropathic pain.  Continue analgesics as needed. Maybe physical therapy evaluation might be helpful.    Hypertension Continue diltiazem 300 mg p.o. daily. Continue lisinopril 40 mg p.o. daily. Monitor blood pressure, heart rate, renal function and electrolytes.    Atrial flutter (HCC)   Atrial fibrillation (HCC) CHA?DS?-VASc Score of at least 5. Continue Advil 2000 and warfarin.  Sleep apnea Patient declined to use one of our CPAP devices.    Anxiety Continue duloxetine.    Seizures ([place]) No seizure activity many years. Continue topiramate and gabapentin.    Coronary artery disease Asymptomatic at this time. Continue statin, calcium channel blocker and  warfarin.   DVT prophylaxis: On warfarin. Code Status: Full code. Family Communication:  Disposition Plan: Admit for IV antibiotic therapy and lower back MRI imaging Consults called:  Admission status: Inpatient/MedSurg   Reubin Milan MD [facility] Pager [phone].  If 7PM-7AM, please contact night-coverage [email or web address] Password [facility]  09/27/2017, 11:57 PM

## 2017-09-27 NOTE — ED Provider Notes (Signed)
[facility] EMERGENCY DEPARTMENT Provider Note   CSN: [id number] Arrival date & time: 09/27/17  1901     History   Chief Complaint Chief Complaint  Patient presents with  . Back Pain    HPI Deborah Maddox is a 62 y.o. female.  Chief complaint: erythema on right abdomen for 3 days.  No fever, sweats, chills.  She has taken nothing for it.  Review of systems positive for left posterior buttock pain with radiation to left leg.  Severity of symptoms is moderate.      Past Medical History:  Diagnosis Date  . Anxiety   . Arthritis   . Cancer (HCC)    cervical  . 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 Coronary artery disease   . Foot drop    left foot  . Hypertension   . Left foot drop   . 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 PONV (postoperative nausea and vomiting)   . Seizures ([place])    last one 10 years ago  . Sleep apnea   . Stroke [facility])    at age 78, left sided weakness    Patient Active Problem List   Diagnosis Date Noted  . Foot drop 06/01/2017  . Encounter for therapeutic drug monitoring 09/14/2013  . Long term (current) use of anticoagulants 12/14/2012  . Atrial fibrillation ([place]) 12/14/2012  . Acute pulmonary embolism ([place]) 12/05/2012  . Atrial flutter ([place]) 12/05/2012  . Unspecified constipation 09/28/2012  . Rectal bleeding 09/28/2012  . Heart palpitations 02/01/2012  . Hypertension 02/01/2012  . Chest pain at rest 02/01/2012    Past Surgical History:  Procedure Laterality Date  . BREAST SURGERY    . CERVICAL CONE BIOPSY    . COLONOSCOPY  2006   Dr. Anthony Sar: normal colonoscopy   . COLONOSCOPY WITH PROPOFOL N/A 10/06/2012   Procedure: COLONOSCOPY WITH PROPOFOL;  Surgeon: Daneil Dolin, MD;  Location: [facility];  Service: Endoscopy;  Laterality: N/A;  entered cecum @ 0905; total cecal withdrawal time=  69minutes  . DILATION AND CURETTAGE OF UTERUS    . LEG SURGERY    . LEG SURGERY     left  . OPEN REDUCTION INTERNAL FIXATION  (ORIF) HAND     right hand and arm  . POLYPECTOMY N/A 10/06/2012   Procedure: POLYPECTOMY;  Surgeon: Daneil Dolin, MD;  Location: [facility];  Service: Endoscopy;  Laterality: N/A;  hepatic flexure polyp  . TONSILLECTOMY      OB History    Gravida Para Term Preterm AB Living   3 2 2   1      SAB TAB Ectopic Multiple Live Births   1               Home Medications    Prior to Admission medications   Medication Sig Start Date End Date Taking? Authorizing Provider  atorvastatin (LIPITOR) 10 MG tablet Take 10 mg by mouth at bedtime.   Yes [provider]  Cholecalciferol (VITAMIN D) 2000 UNITS CAPS Take 1 capsule by mouth daily.   Yes [provider]  cyanocobalamin (,VITAMIN B-12,) 1000 MCG/ML injection Inject 1,000 mcg into the muscle every 30 (thirty) days. 15th of each month   Yes [provider]  diclofenac sodium (VOLTAREN) 1 % GEL Apply 2-4 g topically 3 (three) times daily. to knees   Yes [provider]  diltiazem (TIAZAC) 300 MG 24 hr capsule Take  300 mg by mouth daily.   Yes [provider]  docusate sodium (COLACE) 100 MG capsule Take 100 mg by mouth 2 (two) times daily.    Yes [provider]  DULoxetine (CYMBALTA) 60 MG capsule Take 60 mg by mouth daily.     Yes [provider]  fluticasone (FLONASE) 50 MCG/ACT nasal spray Place 2 sprays into both nostrils daily.   Yes [provider]  furosemide (LASIX) 20 MG tablet Take 20 mg by mouth daily as needed for fluid.    Yes [provider]  gabapentin (NEURONTIN) 300 MG capsule Take 300-600 mg by mouth See admin instructions. Takes 300 mg three times daily and 600 mg at bedtime.    Yes [provider]  Linaclotide (LINZESS) 290 MCG CAPS Take 1 capsule by mouth daily. 30 minutes before breakfast. 10/13/12  Yes Annitta Needs, NP  lisinopril (PRINIVIL,ZESTRIL) 40 MG tablet Take 40 mg by mouth daily.     Yes [provider]  loratadine  (CLARITIN) 10 MG tablet Take 10 mg by mouth daily.   Yes [provider]  montelukast (SINGULAIR) 10 MG tablet Take 10 mg by mouth daily.   Yes [provider]  omeprazole (PRILOSEC) 20 MG capsule Take 20 mg by mouth daily.     Yes [provider]  oxybutynin (DITROPAN) 5 MG tablet Take 5 mg by mouth 2 (two) times daily.   Yes [provider]  senna (SENOKOT) 8.6 MG TABS Take 2 tablets by mouth daily.   Yes [provider]  topiramate (TOPAMAX) 50 MG tablet Take 50 mg by mouth 2 (two) times daily.    Yes [provider]  warfarin (COUMADIN) 2.5 MG tablet Take 2.5-5 mg by mouth See admin instructions. 5mg  on Mondays and Thursdays only. 2.5mg  on all other days at 4pm   Yes [provider]  clotrimazole-betamethasone (LOTRISONE) cream Apply 1 application topically 2 (two) times daily as needed.     [provider]    Family History Family History  Problem Relation Age of Onset  . Diabetes Mother   . Diabetes Sister   . Cancer Brother        "all over"   . Cancer Other   . Diabetes Other   . Colon cancer Unknown        unsure    Social History Social History   Tobacco Use  . Smoking status: Former Smoker    Years: 5.00    Last attempt to quit: 03/25/1981    Years since quitting: 36.5  . Smokeless tobacco: Never Used  Substance Use Topics  . Alcohol use: No  . Drug use: No     Allergies   Vancomycin; Latex; and Other   Review of Systems Review of Systems  All other systems reviewed and are negative.    Physical Exam Updated Vital Signs BP 133/89 (BP Location: Right Arm)   Pulse 97   Temp 98.1 F (36.7 C)   Resp 18   Ht 5\' 5"  (1.651 m)   Wt 115.2 kg (254 lb)   SpO2 98%   BMI 42.27 kg/m   Physical Exam  Constitutional: She is oriented to person, place, and time. She appears well-developed and well-nourished.  obese  HENT:  Head: Normocephalic and atraumatic.  Eyes: Conjunctivae are  normal.  Neck: Neck supple.  Cardiovascular: Normal rate and regular rhythm.  Pulmonary/Chest: Effort normal and breath sounds normal.  Abdominal: Soft. Bowel sounds are normal.  Musculoskeletal: Normal range of motion.  Neurological: She is alert and oriented to person, place, and time.  Skin:  Abdominal exam: Large area of erythema in the right lower quadrant with a central hyperpigmented core approximately 3 cm in diameter.  Psychiatric: She has a normal mood and affect. Her behavior is normal.  Nursing note and vitals reviewed.    ED Treatments / Results  Labs (all labs ordered are listed, but only abnormal results are displayed) Labs Reviewed  CBC WITH DIFFERENTIAL/PLATELET - Abnormal; Notable for the following components:      Result Value   WBC 11.2 (*)    RDW 15.7 (*)    Monocytes Absolute 1.5 (*)    All other components within normal limits  BASIC METABOLIC PANEL    EKG  EKG Interpretation None       Radiology Dg Hip Unilat W Or Wo Pelvis 2-3 Views Left  Result Date: 09/27/2017 CLINICAL DATA:  Left posterior hip pain x1 week without known injury. EXAM: DG HIP (WITH OR WITHOUT PELVIS) 2-3V LEFT COMPARISON:  None. FINDINGS: AP view of the pelvis as well as AP and frog-leg views of the left hip are provided. No acute displaced fracture or joint dislocation is seen. There is lower lumbar facet arthropathy at L4-5 and L5-S1. Bony pelvis appears intact. No suspicious osseous lesions. No soft tissue mass or mineralization. IMPRESSION: No acute osseous abnormality of the bony pelvis and either hip. Lower lumbar degenerative facet arthropathy. Electronically Signed   By: Ashley Royalty M.D.   On: 09/27/2017 22:52    Procedures Procedures (including critical care time)  Medications Ordered in ED Medications  vancomycin (VANCOCIN) 2,000 mg in sodium chloride 0.9 % 500 mL IVPB (0 mg Intravenous Stopped 09/27/17 2320)  diphenhydrAMINE (BENADRYL) injection 12.5 mg (12.5 mg  Intravenous Given 09/27/17 2325)     Initial Impression / Assessment and Plan / ED Course  I have reviewed the triage vital signs and the nursing notes.  Pertinent labs & imaging results that were available during my care of the patient were reviewed by me and considered in my medical decision making (see chart for details).     Patient presents with erythema in her right lower quadrant.  History and physical consistent with cellulitis.  IV vancomycin ordered which patient appeared to do have an adverse reaction [skin irritation].  Will change antibiotic to IV doxycycline.  Left buttock pain with radiation to the left leg appears to be sciatica.  Admit to general medicine.  Final Clinical Impressions(s) / ED Diagnoses   Final diagnoses:  Cellulitis, abdominal wall    ED Discharge Orders    None       Nat Christen, MD 09/28/17 0003

## 2017-09-27 NOTE — ED Triage Notes (Addendum)
Pt c/o lower back pain that radiates down the left leg. Pt denies any injury. Pt also c/o abscess to the left abd.

## 2017-09-27 NOTE — Progress Notes (Signed)
Pharmacy Note:  Initial antibiotic(s) regimen of Vancomycin ordered by EDP to treat cellulitis.  CrCl cannot be calculated (Patient's most recent lab result is older than the maximum 21 days allowed.).   Allergies  Allergen Reactions  . Latex Rash  . Other Rash    HEART  MONITOR TAPE: severe rash, bleeding    Vitals:   09/27/17 1904 09/27/17 2050  BP: (!) 151/96 (!) 131/58  Pulse: (!) 106 90  Resp: (!) 21 18  Temp: 98.1 F (36.7 C)   SpO2: 96% 100%    Anti-infectives (From admission, onward)   Start     Dose/Rate Route Frequency Ordered Stop   09/27/17 2230  vancomycin (VANCOCIN) 2,000 mg in sodium chloride 0.9 % 500 mL IVPB     2,000 mg 250 mL/hr over 120 Minutes Intravenous  Once 09/27/17 2205        Plan: Initial dose(s) of Vancomycin 2000mg  X 1 ordered. F/U admission orders for further dosing if therapy continued.  Isac Sarna, BS [place], [place] Clinical Pharmacist Pager [phone] 09/27/2017 10:06 PM

## 2017-09-28 ENCOUNTER — Inpatient Hospital Stay (HOSPITAL_COMMUNITY): Payer: Medicare Other

## 2017-09-28 ENCOUNTER — Other Ambulatory Visit: Payer: Self-pay

## 2017-09-28 ENCOUNTER — Encounter (HOSPITAL_COMMUNITY): Payer: Self-pay | Admitting: Emergency Medicine

## 2017-09-28 DIAGNOSIS — M545 Low back pain, unspecified: Secondary | ICD-10-CM | POA: Diagnosis present

## 2017-09-28 DIAGNOSIS — R569 Unspecified convulsions: Secondary | ICD-10-CM

## 2017-09-28 DIAGNOSIS — Z22322 Carrier or suspected carrier of Methicillin resistant Staphylococcus aureus: Secondary | ICD-10-CM

## 2017-09-28 LAB — COMPREHENSIVE METABOLIC PANEL
ALT: 13 U/L — AB (ref 14–54)
AST: 14 U/L — ABNORMAL LOW (ref 15–41)
Albumin: 3.2 g/dL — ABNORMAL LOW (ref 3.5–5.0)
Alkaline Phosphatase: 81 U/L (ref 38–126)
Anion gap: 9 (ref 5–15)
BUN: 17 mg/dL (ref 6–20)
CHLORIDE: 108 mmol/L (ref 101–111)
CO2: 22 mmol/L (ref 22–32)
Calcium: 8.3 mg/dL — ABNORMAL LOW (ref 8.9–10.3)
Creatinine, Ser: 0.57 mg/dL (ref 0.44–1.00)
Glucose, Bld: 115 mg/dL — ABNORMAL HIGH (ref 65–99)
Potassium: 3.8 mmol/L (ref 3.5–5.1)
SODIUM: 139 mmol/L (ref 135–145)
Total Bilirubin: 0.7 mg/dL (ref 0.3–1.2)
Total Protein: 6.5 g/dL (ref 6.5–8.1)

## 2017-09-28 LAB — MRSA PCR SCREENING: MRSA by PCR: POSITIVE — AB

## 2017-09-28 LAB — CBC WITH DIFFERENTIAL/PLATELET
BASOS ABS: 0 10*3/uL (ref 0.0–0.1)
Basophils Relative: 0 %
EOS ABS: 0.1 10*3/uL (ref 0.0–0.7)
Eosinophils Relative: 1 %
HCT: 37.8 % (ref 36.0–46.0)
Hemoglobin: 12 g/dL (ref 12.0–15.0)
LYMPHS ABS: 2.6 10*3/uL (ref 0.7–4.0)
LYMPHS PCT: 24 %
MCH: 30.5 pg (ref 26.0–34.0)
MCHC: 31.7 g/dL (ref 30.0–36.0)
MCV: 96.2 fL (ref 78.0–100.0)
MONO ABS: 1.6 10*3/uL — AB (ref 0.1–1.0)
Monocytes Relative: 15 %
Neutro Abs: 6.5 10*3/uL (ref 1.7–7.7)
Neutrophils Relative %: 60 %
PLATELETS: 278 10*3/uL (ref 150–400)
RBC: 3.93 MIL/uL (ref 3.87–5.11)
RDW: 15.6 % — AB (ref 11.5–15.5)
WBC: 10.9 10*3/uL — AB (ref 4.0–10.5)

## 2017-09-28 LAB — PROTIME-INR
INR: 2.44
PROTHROMBIN TIME: 26.3 s — AB (ref 11.4–15.2)

## 2017-09-28 MED ORDER — GABAPENTIN 300 MG PO CAPS
300.0000 mg | ORAL_CAPSULE | Freq: Three times a day (TID) | ORAL | Status: DC
  Administered 2017-09-28 – 2017-10-01 (×9): 300 mg via ORAL
  Filled 2017-09-28 (×9): qty 1

## 2017-09-28 MED ORDER — WARFARIN SODIUM 2.5 MG PO TABS
2.5000 mg | ORAL_TABLET | Freq: Once | ORAL | Status: AC
Start: 2017-09-28 — End: 2017-09-28
  Administered 2017-09-28: 2.5 mg via ORAL
  Filled 2017-09-28: qty 1

## 2017-09-28 MED ORDER — GABAPENTIN 300 MG PO CAPS
600.0000 mg | ORAL_CAPSULE | Freq: Every day | ORAL | Status: DC
  Administered 2017-09-28 – 2017-09-30 (×3): 600 mg via ORAL
  Filled 2017-09-28 (×3): qty 2

## 2017-09-28 MED ORDER — DOCUSATE SODIUM 100 MG PO CAPS
100.0000 mg | ORAL_CAPSULE | Freq: Two times a day (BID) | ORAL | Status: DC
  Administered 2017-09-28 – 2017-10-01 (×6): 100 mg via ORAL
  Filled 2017-09-28 (×7): qty 1

## 2017-09-28 MED ORDER — SODIUM CHLORIDE 0.9 % IV SOLN
100.0000 mg | Freq: Two times a day (BID) | INTRAVENOUS | Status: DC
  Administered 2017-09-28 – 2017-10-01 (×6): 100 mg via INTRAVENOUS
  Filled 2017-09-28 (×12): qty 100

## 2017-09-28 MED ORDER — ACETAMINOPHEN 325 MG PO TABS
650.0000 mg | ORAL_TABLET | Freq: Four times a day (QID) | ORAL | Status: DC | PRN
  Administered 2017-09-28 – 2017-09-30 (×3): 650 mg via ORAL
  Filled 2017-09-28 (×3): qty 2

## 2017-09-28 MED ORDER — FUROSEMIDE 20 MG PO TABS: 20.0000 mg | ORAL_TABLET | Freq: Every day | ORAL | Status: DC | PRN

## 2017-09-28 MED ORDER — ATORVASTATIN CALCIUM 10 MG PO TABS
10.0000 mg | ORAL_TABLET | Freq: Every day | ORAL | Status: DC
  Administered 2017-09-28 – 2017-09-30 (×3): 10 mg via ORAL
  Filled 2017-09-28 (×4): qty 1

## 2017-09-28 MED ORDER — OXYBUTYNIN CHLORIDE 5 MG PO TABS
5.0000 mg | ORAL_TABLET | Freq: Two times a day (BID) | ORAL | Status: DC
  Administered 2017-09-28 – 2017-10-01 (×7): 5 mg via ORAL
  Filled 2017-09-28 (×10): qty 1

## 2017-09-28 MED ORDER — HYDROMORPHONE HCL 1 MG/ML IJ SOLN
1.0000 mg | Freq: Once | INTRAMUSCULAR | Status: AC
  Administered 2017-09-28: 1 mg via INTRAVENOUS
  Filled 2017-09-28: qty 1

## 2017-09-28 MED ORDER — ONDANSETRON HCL 4 MG PO TABS: 4.0000 mg | ORAL_TABLET | Freq: Four times a day (QID) | ORAL | Status: DC | PRN

## 2017-09-28 MED ORDER — KETOROLAC TROMETHAMINE 30 MG/ML IJ SOLN
30.0000 mg | Freq: Once | INTRAMUSCULAR | Status: AC
  Administered 2017-09-28: 30 mg via INTRAVENOUS
  Filled 2017-09-28: qty 1

## 2017-09-28 MED ORDER — SENNA 8.6 MG PO TABS
2.0000 | ORAL_TABLET | Freq: Every day | ORAL | Status: DC
  Administered 2017-09-28 – 2017-10-01 (×4): 17.2 mg via ORAL
  Filled 2017-09-28 (×6): qty 2

## 2017-09-28 MED ORDER — MONTELUKAST SODIUM 10 MG PO TABS
10.0000 mg | ORAL_TABLET | Freq: Every day | ORAL | Status: DC
  Administered 2017-09-28 – 2017-10-01 (×4): 10 mg via ORAL
  Filled 2017-09-28 (×4): qty 1

## 2017-09-28 MED ORDER — LINACLOTIDE 145 MCG PO CAPS
290.0000 ug | ORAL_CAPSULE | Freq: Every day | ORAL | Status: DC
  Administered 2017-09-28 – 2017-10-01 (×4): 290 ug via ORAL
  Filled 2017-09-28: qty 1
  Filled 2017-09-28 (×6): qty 2
  Filled 2017-09-28: qty 1
  Filled 2017-09-28: qty 2

## 2017-09-28 MED ORDER — LISINOPRIL 10 MG PO TABS
40.0000 mg | ORAL_TABLET | Freq: Every day | ORAL | Status: DC
  Administered 2017-09-28 – 2017-10-01 (×4): 40 mg via ORAL
  Filled 2017-09-28 (×4): qty 4

## 2017-09-28 MED ORDER — DULOXETINE HCL 60 MG PO CPEP
60.0000 mg | ORAL_CAPSULE | Freq: Every day | ORAL | Status: DC
  Administered 2017-09-28 – 2017-10-01 (×4): 60 mg via ORAL
  Filled 2017-09-28 (×4): qty 1

## 2017-09-28 MED ORDER — ONDANSETRON HCL 4 MG/2ML IJ SOLN
4.0000 mg | Freq: Four times a day (QID) | INTRAMUSCULAR | Status: DC | PRN
  Administered 2017-09-28 (×2): 4 mg via INTRAVENOUS
  Filled 2017-09-28 (×2): qty 2

## 2017-09-28 MED ORDER — SODIUM CHLORIDE 0.9 % IV SOLN
3.0000 g | Freq: Four times a day (QID) | INTRAVENOUS | Status: DC
  Administered 2017-09-28: 3 g via INTRAVENOUS
  Filled 2017-09-28 (×5): qty 3

## 2017-09-28 MED ORDER — MUPIROCIN 2 % EX OINT
1.0000 "application " | TOPICAL_OINTMENT | Freq: Two times a day (BID) | CUTANEOUS | Status: DC
  Administered 2017-09-28 – 2017-10-01 (×6): 1 via NASAL
  Filled 2017-09-28: qty 22

## 2017-09-28 MED ORDER — ACETAMINOPHEN 650 MG RE SUPP: 650.0000 mg | Freq: Four times a day (QID) | RECTAL | Status: DC | PRN

## 2017-09-28 MED ORDER — LORATADINE 10 MG PO TABS
10.0000 mg | ORAL_TABLET | Freq: Every day | ORAL | Status: DC
  Administered 2017-09-28 – 2017-10-01 (×4): 10 mg via ORAL
  Filled 2017-09-28 (×4): qty 1

## 2017-09-28 MED ORDER — SODIUM CHLORIDE 0.9 % IV SOLN
INTRAVENOUS | Status: DC
  Administered 2017-09-28 – 2017-10-01 (×3): via INTRAVENOUS

## 2017-09-28 MED ORDER — CHLORHEXIDINE GLUCONATE CLOTH 2 % EX PADS
6.0000 | MEDICATED_PAD | Freq: Every day | CUTANEOUS | Status: DC
  Administered 2017-09-29 – 2017-10-01 (×3): 6 via TOPICAL

## 2017-09-28 MED ORDER — DILTIAZEM HCL ER BEADS 300 MG PO CP24
300.0000 mg | ORAL_CAPSULE | Freq: Every day | ORAL | Status: DC
  Administered 2017-09-28 – 2017-10-01 (×4): 300 mg via ORAL
  Filled 2017-09-28 (×6): qty 1

## 2017-09-28 MED ORDER — WARFARIN - PHARMACIST DOSING INPATIENT
Freq: Every day | Status: DC
  Administered 2017-09-29 – 2017-09-30 (×2)

## 2017-09-28 MED ORDER — CLINDAMYCIN PHOSPHATE 900 MG/50ML IV SOLN
INTRAVENOUS | Status: AC
  Filled 2017-09-28: qty 50

## 2017-09-28 MED ORDER — PANTOPRAZOLE SODIUM 40 MG PO TBEC
40.0000 mg | DELAYED_RELEASE_TABLET | Freq: Every day | ORAL | Status: DC
  Administered 2017-09-28 – 2017-10-01 (×4): 40 mg via ORAL
  Filled 2017-09-28 (×4): qty 1

## 2017-09-28 MED ORDER — TOPIRAMATE 25 MG PO TABS
50.0000 mg | ORAL_TABLET | Freq: Two times a day (BID) | ORAL | Status: DC
  Administered 2017-09-28 – 2017-10-01 (×7): 50 mg via ORAL
  Filled 2017-09-28 (×15): qty 2

## 2017-09-28 MED ORDER — MORPHINE SULFATE (PF) 2 MG/ML IV SOLN
1.0000 mg | INTRAVENOUS | Status: DC | PRN
  Administered 2017-09-28: 1 mg via INTRAVENOUS
  Filled 2017-09-28: qty 1

## 2017-09-28 MED ORDER — FLUTICASONE PROPIONATE 50 MCG/ACT NA SUSP
2.0000 | Freq: Every day | NASAL | Status: DC
  Administered 2017-09-28 – 2017-10-01 (×4): 2 via NASAL
  Filled 2017-09-28 (×3): qty 16

## 2017-09-28 MED ORDER — GABAPENTIN 300 MG PO CAPS: 300.0000 mg | ORAL_CAPSULE | ORAL | Status: DC

## 2017-09-28 NOTE — Progress Notes (Signed)
Patient states she had RLQ pain for 3 days  And is worried it could be her appendix. Pain is near red indurated area over RLQ. Rates pain at 5 and requests morphine sulfate.

## 2017-09-28 NOTE — Progress Notes (Addendum)
ANTICOAGULATION CONSULT NOTE - Initial Consult  Pharmacy Consult for Coumadin Indication: atrial fibrillation  Allergies  Allergen Reactions  . Vancomycin Hives and Itching  . Latex Rash  . Other Rash    HEART  MONITOR TAPE: severe rash, bleeding    Patient Measurements: Height: 5\' 5"  (165.1 cm) Weight: 254 lb (115.2 kg) IBW/kg (Calculated) : 57  Vital Signs: BP: 114/88 (02/19 0619) Pulse Rate: 76 (02/19 0619)  Labs: Recent Labs    09/27/17 2203 09/27/17 2330 09/28/17 0721  HGB 13.6  --  12.0  HCT 43.0  --  37.8  PLT 289  --  278  LABPROT  --   --  26.3*  INR  --   --  2.44  CREATININE  --  0.62 0.57    Estimated Creatinine Clearance: 90.1 mL/min (by C-G formula based on SCr of 0.57 mg/dL).   Medical History: Past Medical History:  Diagnosis Date  . Anxiety   . Arthritis   . Cancer (HCC)    cervical  . 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 Coronary artery disease   . Foot drop    left foot  . Hypertension   . Left foot drop   . 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 PONV (postoperative nausea and vomiting)   . Seizures ([place])    last one 10 years ago  . Sleep apnea   . Stroke [facility])    at age 73, left sided weakness    Medications:  See med rec  Assessment: 62 yo female admitted with back pain. Patient on chronic Coumadin for afib. Pharmacy asked to manage. INR is therapeutic this AM. Home regimen:  5mg  on Mondays and Thursdays only. 2.5mg  on all other days at 4pm  Goal of Therapy:  INR 2-3 Monitor platelets by anticoagulation protocol: Yes   Plan:  Coumadin 2.5mg  x 1 today Daily PT-INR Monitor for S/S of bleeding  Isac Sarna, BS [place], BCPS Clinical Pharmacist Pager [phone] 09/28/2017,8:59 AM

## 2017-09-28 NOTE — Progress Notes (Signed)
Pharmacy Antibiotic Note  Deborah Maddox is a 62 y.o. female admitted on 09/27/2017 with cellulitis.  Pharmacy has been consulted for Unasyn dosing.  Plan: unasyn 3gm IV q6h F/U cxs and clinical progress Monitor V/s, labs  Height: 5\' 5"  (165.1 cm) Weight: 278 lb 7.1 oz (126.3 kg) IBW/kg (Calculated) : 57  Temp (24hrs), Avg:98 F (36.7 C), Min:97.9 F (36.6 C), Max:98.1 F (36.7 C)  Recent Labs  Lab 09/27/17 2203 09/27/17 2330 09/28/17 0721  WBC 11.2*  --  10.9*  CREATININE  --  0.62 0.57    Estimated Creatinine Clearance: 95 mL/min (by C-G formula based on SCr of 0.57 mg/dL).    Allergies  Allergen Reactions  . Vancomycin Hives and Itching  . Latex Rash  . Other Rash    HEART  MONITOR TAPE: severe rash, bleeding    Antimicrobials this admission: Unasyn 2/19 Clindamycin 2/19>>2/19 Vancomycin 2000mg  x 1 dose in ED 2/19  Dose adjustments this admission: n/a  Microbiology results: No cultures as of now  Thank you for allowing pharmacy to be a part of this patient's care.  Isac Sarna, BS Pharm D, BCPS Clinical Pharmacist Pager [phone] 09/28/2017 12:00 PM

## 2017-09-28 NOTE — Progress Notes (Signed)
PROGRESS NOTE   Deborah Maddox  GBT:[id number]  DOB: 06-09-1956  DOA: 09/27/2017 PCP: Jani Gravel, MD   Brief Admission Hx: Deborah Maddox is a 62 y.o. female with medical history significant of anxiety, osteoarthritis, cervical cancer, ventricular septal defect, CAD, left foot drop, Hypertension, seizures (last one many years ago), sleep apnea on CPAP, history of stroke at age 63 with left-sided weakness who is coming to the emergency department with complaints of 2-week history of left-sided lower back pain radiating to her lower extremity and right lower quadrant abscess minimally tender, with calor and surrounding erythema.  She denies fever, chills, nausea, emesis, fatigue or malaise.  No runny nose, sore throat, dyspnea, wheezing, hemoptysis, chest pain, palpitations, dizziness, diaphoresis, PND orthopnea.  Denies abdominal pain, diarrhea, constipation, melena or hematochezia.  Denies dysuria, frequency or hematuria.  MDM/Assessment & Plan:   1. Cellulitis/abscess of the abdominal wall-there is an area of fluctuance that could benefit from I&D.  Will consult general surgery to evaluate for I&D.  Continue IV antibiotics. 2. Lower back pain-this is a chronic condition according to the patient.  Continue pain management. 3. Essential hypertension-resume home blood pressure medications and follow. 4. A. fib/flutter-stable and controlled-resume home medications. 5. Sleep apnea-patient declined CPAP. 6. Epilepsy- resume home topiramate and gabapentin. 7. CAD-stable resume home medications.  DVT prophylaxis: Warfarin Code Status: Full Family Communication: No one present at bedside during rounds Disposition Plan: To be determined  Subjective: The patient reports that she only has pain in the abdominal wall when it is manipulated.  She denies fever and chills.  Objective: Vitals:   09/28/17 0401 09/28/17 0402 09/28/17 0619 09/28/17 0924  BP: 125/72 125/72 114/88 97/67  Pulse: 89  76 69    Resp: 18  18   Temp:    97.9 F (36.6 C)  TempSrc:    Oral  SpO2: 100%  98% 100%  Weight:    126.3 kg (278 lb 7.1 oz)  Height:    5\' 5"  (1.651 m)    Intake/Output Summary (Last 24 hours) at 09/28/2017 1159 Last data filed at 09/28/2017 0900 Gross per 24 hour  Intake 150 ml  Output 300 ml  Net -150 ml   Filed Weights   09/27/17 1905 09/28/17 0924  Weight: 115.2 kg (254 lb) 126.3 kg (278 lb 7.1 oz)   REVIEW OF SYSTEMS  As per history otherwise all reviewed and reported negative  Exam:  General exam: Morbidly obese female sitting up in the bed in no apparent distress, cooperative, oriented x3. Respiratory system: Clear. No increased work of breathing. Cardiovascular system: S1 & S2 heard, RRR. No JVD, murmurs, gallops, clicks or pedal edema. Gastrointestinal system: Abdomen is nondistended, soft and nontender. Normal bowel sounds heard. Skin: Fluctuant tender abdominal skin abscess approximately 10 cm in diameter and tender to palpation surrounded by cellulitis of the skin.    Central nervous system: Alert and oriented. No focal neurological deficits. Extremities: Chronic left foot drop, left foot and ankle in a brace.  Data Reviewed: Basic Metabolic Panel: Recent Labs  Lab 09/27/17 2330 09/28/17 0721  NA 140 139  K 3.9 3.8  CL 108 108  CO2 23 22  GLUCOSE 124* 115*  BUN 15 17  CREATININE 0.62 0.57  CALCIUM 8.6* 8.3*   Liver Function Tests: Recent Labs  Lab 09/28/17 0721  AST 14*  ALT 13*  ALKPHOS 81  BILITOT 0.7  PROT 6.5  ALBUMIN 3.2*   No results for input(s): LIPASE, AMYLASE  in the last 168 hours. No results for input(s): AMMONIA in the last 168 hours. CBC: Recent Labs  Lab 09/27/17 2203 09/28/17 0721  WBC 11.2* 10.9*  NEUTROABS 7.2 6.5  HGB 13.6 12.0  HCT 43.0 37.8  MCV 96.8 96.2  PLT 289 278   Cardiac Enzymes: No results for input(s): CKTOTAL, CKMB, CKMBINDEX, TROPONINI in the last 168 hours. CBG (last 3)  No results for input(s):  GLUCAP in the last 72 hours. No results found for this or any previous visit (from the past 240 hour(s)).   Studies: Dg Hip Unilat W Or Wo Pelvis 2-3 Views Left  Result Date: 09/27/2017 CLINICAL DATA:  Left posterior hip pain x1 week without known injury. EXAM: DG HIP (WITH OR WITHOUT PELVIS) 2-3V LEFT COMPARISON:  None. FINDINGS: AP view of the pelvis as well as AP and frog-leg views of the left hip are provided. No acute displaced fracture or joint dislocation is seen. There is lower lumbar facet arthropathy at L4-5 and L5-S1. Bony pelvis appears intact. No suspicious osseous lesions. No soft tissue mass or mineralization. IMPRESSION: No acute osseous abnormality of the bony pelvis and either hip. Lower lumbar degenerative facet arthropathy. Electronically Signed   By: Ashley Royalty M.D.   On: 09/27/2017 22:52   Scheduled Meds: . atorvastatin  10 mg Oral QHS  . diltiazem  300 mg Oral Daily  . docusate sodium  100 mg Oral BID  . DULoxetine  60 mg Oral Daily  . fluticasone  2 spray Each Nare Daily  . gabapentin  300 mg Oral TID WC  . gabapentin  600 mg Oral QHS  . linaclotide  290 mcg Oral Daily  . lisinopril  40 mg Oral Daily  . loratadine  10 mg Oral Daily  . montelukast  10 mg Oral Daily  . oxybutynin  5 mg Oral BID  . pantoprazole  40 mg Oral Daily  . senna  2 tablet Oral Daily  . topiramate  50 mg Oral BID  . warfarin  2.5 mg Oral Once  . Warfarin - Pharmacist Dosing Inpatient   Does not apply q1800   Continuous Infusions: . sodium chloride 50 mL/hr at 09/28/17 0321  . ampicillin-sulbactam (UNASYN) IV      Principal Problem:   Cellulitis of abdominal wall Active Problems:   Hypertension   Atrial flutter (HCC)   Atrial fibrillation (HCC)   Sleep apnea   Anxiety   Coronary artery disease   Lower back pain   Seizures ([place])  Time spent:   Irwin Brakeman, MD, FAAFP [facility] Pager [phone]  If 7PM-7AM, please contact  night-coverage [email or web address] Password [email or web address] 09/28/2017, 11:59 AM    LOS: 1 day

## 2017-09-28 NOTE — Consult Note (Signed)
[facility] Consult  Reason for Consult: Cellulitis/ abscess right side pannus  Referring Physician: Dr. Wynetta Emery   Chief Complaint    Back Pain      Deborah Maddox is a 62 y.o. female.  HPI: Deborah Maddox is a 62 yo female with CAD, lett hemiparesis from a stroke who is admitted to the hospital with back pain and infection on on abdomen.  She says she has had other abscesses in her nose in the past but none on her skin. No fevers or chills. Otherwise awake and oriented and taking.    Past Medical History:  Diagnosis Date  . Anxiety   . Arthritis   . Cancer (HCC)    cervical  . 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 Coronary artery disease   . Foot drop    left foot  . Hypertension   . Left foot drop   . 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 PONV (postoperative nausea and vomiting)   . Seizures ([place])    last one 10 years ago  . Sleep apnea   . Stroke [facility])    at age 85, left sided weakness    Past Surgical History:  Procedure Laterality Date  . BREAST SURGERY    . CERVICAL CONE BIOPSY    . COLONOSCOPY  2006   Dr. Anthony Sar: normal colonoscopy   . COLONOSCOPY WITH PROPOFOL N/A 10/06/2012   Procedure: COLONOSCOPY WITH PROPOFOL;  Surgeon: Daneil Dolin, MD;  Location: [facility];  Service: Endoscopy;  Laterality: N/A;  entered cecum @ 0905; total cecal withdrawal time=  47mnutes  . DILATION AND CURETTAGE OF UTERUS    . LEG SURGERY    . LEG SURGERY     left  . OPEN REDUCTION INTERNAL FIXATION (ORIF) HAND     right hand and arm  . POLYPECTOMY N/A 10/06/2012   Procedure: POLYPECTOMY;  Surgeon: RDaneil Dolin MD;  Location: [facility];  Service: Endoscopy;  Laterality: N/A;  hepatic flexure polyp  . TONSILLECTOMY      Family History  Problem Relation Age of Onset  . Diabetes Mother   . Diabetes Sister   . Cancer Brother        "all over"   . Cancer Other   . Diabetes Other   . Colon cancer Unknown        unsure    Social History    Tobacco Use  . Smoking status: Former Smoker    Years: 5.00    Last attempt to quit: 03/25/1981    Years since quitting: 36.5  . Smokeless tobacco: Never Used  Substance Use Topics  . Alcohol use: No  . Drug use: No    Medications:  I have reviewed the patient's current medications. Prior to Admission:  Medications Prior to Admission  Medication Sig Dispense Refill Last Dose  . atorvastatin (LIPITOR) 10 MG tablet Take 10 mg by mouth at bedtime.   09/26/2017 at Unknown time  . Cholecalciferol (VITAMIN D) 2000 UNITS CAPS Take 1 capsule by mouth daily.   09/27/2017 at Unknown time  . cyanocobalamin (,VITAMIN B-12,) 1000 MCG/ML injection Inject 1,000 mcg into the muscle every 30 (thirty) days. 15th of each month   09/24/2017 at Unknown time  . diclofenac sodium (VOLTAREN) 1 % GEL Apply 2-4 g topically 3 (three) times daily. to knees   09/27/2017 at Unknown time  . diltiazem (TIAZAC) 300 MG 24  hr capsule Take 300 mg by mouth daily.   09/27/2017 at Unknown time  . docusate sodium (COLACE) 100 MG capsule Take 100 mg by mouth 2 (two) times daily.    09/27/2017 at Unknown time  . DULoxetine (CYMBALTA) 60 MG capsule Take 60 mg by mouth daily.     09/27/2017 at Unknown time  . fluticasone (FLONASE) 50 MCG/ACT nasal spray Place 2 sprays into both nostrils daily.   09/27/2017 at Unknown time  . furosemide (LASIX) 20 MG tablet Take 20 mg by mouth daily as needed for fluid.    unknown  . gabapentin (NEURONTIN) 300 MG capsule Take 300-600 mg by mouth See admin instructions. Takes 300 mg three times daily and 600 mg at bedtime.    09/27/2017 at Unknown time  . Linaclotide (LINZESS) 290 MCG CAPS Take 1 capsule by mouth daily. 30 minutes before breakfast. 30 capsule 3 09/27/2017 at Unknown time  . lisinopril (PRINIVIL,ZESTRIL) 40 MG tablet Take 40 mg by mouth daily.     09/27/2017 at Unknown time  . loratadine (CLARITIN) 10 MG tablet Take 10 mg by mouth daily.   09/27/2017 at Unknown time  . montelukast (SINGULAIR)  10 MG tablet Take 10 mg by mouth daily.   09/27/2017 at Unknown time  . omeprazole (PRILOSEC) 20 MG capsule Take 20 mg by mouth daily.     09/27/2017 at Unknown time  . oxybutynin (DITROPAN) 5 MG tablet Take 5 mg by mouth 2 (two) times daily.   09/27/2017 at Unknown time  . senna (SENOKOT) 8.6 MG TABS Take 2 tablets by mouth daily.   09/27/2017 at Unknown time  . topiramate (TOPAMAX) 50 MG tablet Take 50 mg by mouth 2 (two) times daily.    09/27/2017 at Unknown time  . warfarin (COUMADIN) 2.5 MG tablet Take 2.5-5 mg by mouth See admin instructions. '5mg'$  on Mondays and Thursdays only. 2.'5mg'$  on all other days at 4pm   09/27/2017 at 1600  . clotrimazole-betamethasone (LOTRISONE) cream Apply 1 application topically 2 (two) times daily as needed.    unknown   Scheduled: . atorvastatin  10 mg Oral QHS  . diltiazem  300 mg Oral Daily  . docusate sodium  100 mg Oral BID  . DULoxetine  60 mg Oral Daily  . fluticasone  2 spray Each Nare Daily  . gabapentin  300 mg Oral TID WC  . gabapentin  600 mg Oral QHS  . linaclotide  290 mcg Oral Daily  . lisinopril  40 mg Oral Daily  . loratadine  10 mg Oral Daily  . montelukast  10 mg Oral Daily  . oxybutynin  5 mg Oral BID  . pantoprazole  40 mg Oral Daily  . senna  2 tablet Oral Daily  . topiramate  50 mg Oral BID  . warfarin  2.5 mg Oral Once  . Warfarin - Pharmacist Dosing Inpatient   Does not apply q1800   Continuous: . sodium chloride 50 mL/hr at 09/28/17 0321  . ampicillin-sulbactam (UNASYN) IV     OYD:XAJOINOMVEHMC **OR** acetaminophen, furosemide, morphine injection, ondansetron **OR** ondansetron (ZOFRAN) IV  Allergies: Allergies  Allergen Reactions  . Vancomycin Hives and Itching  . Latex Rash  . Other Rash    HEART  MONITOR TAPE: severe rash, bleeding    ROS:  A comprehensive review of systems was negative except for: Gastrointestinal: positive for pain in right side of abdomen Musculoskeletal: positive for back pain  Blood pressure  97/67, pulse 69, temperature 97.9 F (  36.6 C), temperature source Oral, resp. rate 18, height '5\' 5"'$  (1.651 m), weight 278 lb 7.1 oz (126.3 kg), SpO2 100 %. Physical Exam  Constitutional: She is oriented to person, place, and time and well-developed, well-nourished, and in no distress.  HENT:  Head: Normocephalic.  Eyes: Pupils are equal, round, and reactive to light.  Cardiovascular: Normal rate.  Pulmonary/Chest: Effort normal.  Abdominal: Soft. She exhibits no distension.  Redness marked and decreased from the marks, area of induration at about 2cm, fluctuance not appreciated but difficult to exclude, no drainage  Neurological: She is alert and oriented to person, place, and time.  Skin: Skin is warm and dry.  Psychiatric: Mood, memory, affect and judgment normal.    Results: Results for orders placed or performed during the hospital encounter of 09/27/17 (from the past 48 hour(s))  CBC with Differential     Status: Abnormal   Collection Time: 09/27/17 10:03 PM  Result Value Ref Range   WBC 11.2 (H) 4.0 - 10.5 K/uL   RBC 4.44 3.87 - 5.11 MIL/uL   Hemoglobin 13.6 12.0 - 15.0 g/dL   HCT 43.0 36.0 - 46.0 %   MCV 96.8 78.0 - 100.0 fL   MCH 30.6 26.0 - 34.0 pg   MCHC 31.6 30.0 - 36.0 g/dL   RDW 15.7 (H) 11.5 - 15.5 %   Platelets 289 150 - 400 K/uL   Neutrophils Relative % 64 %   Neutro Abs 7.2 1.7 - 7.7 K/uL   Lymphocytes Relative 21 %   Lymphs Abs 2.3 0.7 - 4.0 K/uL   Monocytes Relative 13 %   Monocytes Absolute 1.5 (H) 0.1 - 1.0 K/uL   Eosinophils Relative 2 %   Eosinophils Absolute 0.2 0.0 - 0.7 K/uL   Basophils Relative 0 %   Basophils Absolute 0.1 0.0 - 0.1 K/uL    Comment: Performed at [facility], [address], [place], [place] [address]  Basic metabolic panel     Status: Abnormal   Collection Time: 09/27/17 11:30 PM  Result Value Ref Range   Sodium 140 135 - 145 mmol/L   Potassium 3.9 3.5 - 5.1 mmol/L   Chloride 108 101 - 111 mmol/L   CO2 23 22 - 32 mmol/L    Glucose, Bld 124 (H) 65 - 99 mg/dL   BUN 15 6 - 20 mg/dL   Creatinine, Ser 0.62 0.44 - 1.00 mg/dL   Calcium 8.6 (L) 8.9 - 10.3 mg/dL   GFR calc non Af Amer >60 >60 mL/min   GFR calc Af Amer >60 >60 mL/min    Comment: (NOTE) The eGFR has been calculated using the CKD EPI equation. This calculation has not been validated in all clinical situations. eGFR's persistently <60 mL/min signify possible Chronic Kidney Disease.    Anion gap 9 5 - 15    Comment: Performed at [facility], [address] [place], [place] [address]  CBC WITH DIFFERENTIAL     Status: Abnormal   Collection Time: 09/28/17  7:21 AM  Result Value Ref Range   WBC 10.9 (H) 4.0 - 10.5 K/uL   RBC 3.93 3.87 - 5.11 MIL/uL   Hemoglobin 12.0 12.0 - 15.0 g/dL   HCT 37.8 36.0 - 46.0 %   MCV 96.2 78.0 - 100.0 fL   MCH 30.5 26.0 - 34.0 pg   MCHC 31.7 30.0 - 36.0 g/dL   RDW 15.6 (H) 11.5 - 15.5 %   Platelets 278 150 - 400 K/uL   Neutrophils Relative %  60 %   Neutro Abs 6.5 1.7 - 7.7 K/uL   Lymphocytes Relative 24 %   Lymphs Abs 2.6 0.7 - 4.0 K/uL   Monocytes Relative 15 %   Monocytes Absolute 1.6 (H) 0.1 - 1.0 K/uL   Eosinophils Relative 1 %   Eosinophils Absolute 0.1 0.0 - 0.7 K/uL   Basophils Relative 0 %   Basophils Absolute 0.0 0.0 - 0.1 K/uL    Comment: Performed at [facility], [address] [place], [place] [address]  Comprehensive metabolic panel     Status: Abnormal   Collection Time: 09/28/17  7:21 AM  Result Value Ref Range   Sodium 139 135 - 145 mmol/L   Potassium 3.8 3.5 - 5.1 mmol/L   Chloride 108 101 - 111 mmol/L   CO2 22 22 - 32 mmol/L   Glucose, Bld 115 (H) 65 - 99 mg/dL   BUN 17 6 - 20 mg/dL   Creatinine, Ser 0.57 0.44 - 1.00 mg/dL   Calcium 8.3 (L) 8.9 - 10.3 mg/dL   Total Protein 6.5 6.5 - 8.1 g/dL   Albumin 3.2 (L) 3.5 - 5.0 g/dL   AST 14 (L) 15 - 41 U/L   ALT 13 (L) 14 - 54 U/L   Alkaline Phosphatase 81 38 - 126 U/L   Total Bilirubin 0.7 0.3 - 1.2 mg/dL   GFR calc non Af Amer >60 >60  mL/min   GFR calc Af Amer >60 >60 mL/min    Comment: (NOTE) The eGFR has been calculated using the CKD EPI equation. This calculation has not been validated in all clinical situations. eGFR's persistently <60 mL/min signify possible Chronic Kidney Disease.    Anion gap 9 5 - 15    Comment: Performed at [facility], [address] [place], [place] [address]  Protime-INR     Status: Abnormal   Collection Time: 09/28/17  7:21 AM  Result Value Ref Range   Prothrombin Time 26.3 (H) 11.4 - 15.2 seconds   INR 2.44     Comment: Performed at [facility], [address] [place], [place] [address]   Bedside US of the indurated area -  Small 0.25cm sliver of possible fluid; aspirated < 0.5 cc on needle aspiration under US guidance    Dg Hip Unilat W Or Wo Pelvis 2-3 Views Left  Result Date: 09/27/2017 CLINICAL DATA:  Left posterior hip pain x1 week without known injury. EXAM: DG HIP (WITH OR WITHOUT PELVIS) 2-3V LEFT COMPARISON:  None. FINDINGS: AP view of the pelvis as well as AP and frog-leg views of the left hip are provided. No acute displaced fracture or joint dislocation is seen. There is lower lumbar facet arthropathy at L4-5 and L5-S1. Bony pelvis appears intact. No suspicious osseous lesions. No soft tissue mass or mineralization. IMPRESSION: No acute osseous abnormality of the bony pelvis and either hip. Lower lumbar degenerative facet arthropathy. Electronically Signed   By: Ashley Royalty M.D.   On: 09/27/2017 22:52   Assessment & Plan:  AMELITA RISINGER is a 62 y.o. female with right sided panniculitis with area of induration and small sliver of abscess s/p aspiration at the bedside with verbal consent from the patient.   -IV antibiotics for cellulitis for now, Unasyn ordered, MRSA positive so prob needs to add Vanc for now -Culture ordered for aspirate   All questions were answered to the satisfaction of the patient.  Virl Cagey 09/28/2017, 1:49 PM

## 2017-09-28 NOTE — Progress Notes (Signed)
Dr Constance Haw paged for consult.

## 2017-09-28 NOTE — ED Notes (Signed)
Pt states her arm was itching and there was some redness. Vancomycin was stopped and edp notified.

## 2017-09-29 DIAGNOSIS — G4733 Obstructive sleep apnea (adult) (pediatric): Secondary | ICD-10-CM

## 2017-09-29 DIAGNOSIS — L03311 Cellulitis of abdominal wall: Principal | ICD-10-CM

## 2017-09-29 DIAGNOSIS — R569 Unspecified convulsions: Secondary | ICD-10-CM

## 2017-09-29 DIAGNOSIS — I10 Essential (primary) hypertension: Secondary | ICD-10-CM

## 2017-09-29 DIAGNOSIS — I4891 Unspecified atrial fibrillation: Secondary | ICD-10-CM

## 2017-09-29 LAB — CBC WITH DIFFERENTIAL/PLATELET
BASOS ABS: 0 10*3/uL (ref 0.0–0.1)
Basophils Relative: 0 %
EOS ABS: 0.2 10*3/uL (ref 0.0–0.7)
Eosinophils Relative: 3 %
HCT: 39.6 % (ref 36.0–46.0)
Hemoglobin: 12.2 g/dL (ref 12.0–15.0)
Lymphocytes Relative: 23 %
Lymphs Abs: 2.2 10*3/uL (ref 0.7–4.0)
MCH: 30.4 pg (ref 26.0–34.0)
MCHC: 30.8 g/dL (ref 30.0–36.0)
MCV: 98.8 fL (ref 78.0–100.0)
MONO ABS: 1.2 10*3/uL — AB (ref 0.1–1.0)
Monocytes Relative: 13 %
Neutro Abs: 5.7 10*3/uL (ref 1.7–7.7)
Neutrophils Relative %: 61 %
Platelets: 293 10*3/uL (ref 150–400)
RBC: 4.01 MIL/uL (ref 3.87–5.11)
RDW: 15.9 % — AB (ref 11.5–15.5)
WBC: 9.4 10*3/uL (ref 4.0–10.5)

## 2017-09-29 LAB — BASIC METABOLIC PANEL
ANION GAP: 9 (ref 5–15)
BUN: 15 mg/dL (ref 6–20)
CALCIUM: 8.2 mg/dL — AB (ref 8.9–10.3)
CO2: 22 mmol/L (ref 22–32)
Chloride: 109 mmol/L (ref 101–111)
Creatinine, Ser: 0.56 mg/dL (ref 0.44–1.00)
GFR calc Af Amer: >60 mL/min (ref >60)
GLUCOSE: 112 mg/dL — AB (ref 65–99)
POTASSIUM: 3.9 mmol/L (ref 3.5–5.1)
SODIUM: 140 mmol/L (ref 135–145)

## 2017-09-29 LAB — PROTIME-INR
INR: 2.83
PROTHROMBIN TIME: 29.6 s — AB (ref 11.4–15.2)

## 2017-09-29 MED ORDER — WARFARIN SODIUM 2 MG PO TABS
2.0000 mg | ORAL_TABLET | Freq: Once | ORAL | Status: AC
  Administered 2017-09-29: 2 mg via ORAL
  Filled 2017-09-29: qty 1

## 2017-09-29 NOTE — Progress Notes (Signed)
PROGRESS NOTE   Deborah Maddox  ZOX:[id number]  DOB: 09-Sep-1955  DOA: 09/27/2017 PCP: Jani Gravel, MD   Brief Admission Hx: Deborah Maddox is a 62 y.o. female with medical history significant of anxiety, osteoarthritis, cervical cancer, ventricular septal defect, CAD, left foot drop, Hypertension, seizures (last one many years ago), sleep apnea on CPAP, history of stroke at age 62 with left-sided weakness who is coming to the emergency department with complaints of 2-week history of left-sided lower back pain radiating to her lower extremity and right lower quadrant abscess minimally tender, with calor and surrounding erythema.  She denies fever, chills, nausea, emesis, fatigue or malaise.  No runny nose, sore throat, dyspnea, wheezing, hemoptysis, chest pain, palpitations, dizziness, diaphoresis, PND orthopnea.  Denies abdominal pain, diarrhea, constipation, melena or hematochezia.  Denies dysuria, frequency or hematuria.  MDM/Assessment & Plan:   1. Cellulitis/abscess of the abdominal wall- currently on intravenous antibiotics.  Seen by general surgery and purulent fluid was aspirated at bedside.  Continue intravenous antibiotics for now. 2. Lower back pain-this is a chronic condition according to the patient.  Continue current pain management 3. Essential hypertension- blood pressures currently stable.  Continue on diltiazem and lisinopril.  Continue to monitor. 4. A. fib/flutter-stable and controlled- rate controlled on diltiazem 5. Sleep apnea-patient declined CPAP. 6. Epilepsy- stable.  Resume home topiramate and gabapentin. 7. CAD-stable, no complaints of chest pain.  Continue current treatments.  DVT prophylaxis: Warfarin Code Status: Full Family Communication: No family present Disposition Plan: Possibly discharge home in the next 24 hours.  Will get physical therapy evaluation  Subjective: Still has some pain in abdomen.  No nausea or vomiting.  Objective: Vitals:   09/28/17 1513  09/28/17 2100 09/29/17 0600 09/29/17 1326  BP: (!) 100/54 111/68 131/68 115/63  Pulse: 94 67 97 100  Resp: 18 20  18   Temp: (!) 97.5 F (36.4 C) 98.4 F (36.9 C) 98 F (36.7 C) 98.3 F (36.8 C)  TempSrc: Oral Oral Oral Oral  SpO2: 98% 95% 99% 100%  Weight:      Height:        Intake/Output Summary (Last 24 hours) at 09/29/2017 2002 Last data filed at 09/29/2017 1757 Gross per 24 hour  Intake 2192.5 ml  Output -  Net 2192.5 ml   Filed Weights   09/27/17 1905 09/28/17 0924  Weight: 115.2 kg (254 lb) 126.3 kg (278 lb 7.1 oz)   REVIEW OF SYSTEMS  As per history otherwise all reviewed and reported negative  Exam:  General exam: Alert, awake, oriented x 3 Respiratory system: Clear to auscultation. Respiratory effort normal. Cardiovascular system:RRR. No murmurs, rubs, gallops. Gastrointestinal system: Abdomen is nondistended, soft and nontender. No organomegaly or masses felt. Normal bowel sounds heard. Central nervous system: Alert and oriented. No focal neurological deficits. Extremities: No C/C/E, +pedal pulses Skin: Demarcated area of erythema in the right lower quadrant of abdomen does not extend beyond margins.  No expressible fluid from incision site still has erythema around incision and is tender Psychiatry: Judgement and insight appear normal. Mood & affect appropriate.    Data Reviewed: Basic Metabolic Panel: Recent Labs  Lab 09/27/17 2330 09/28/17 0721 09/29/17 0535  NA 140 139 140  K 3.9 3.8 3.9  CL 108 108 109  CO2 23 22 22   GLUCOSE 124* 115* 112*  BUN 15 17 15   CREATININE 0.62 0.57 0.56  CALCIUM 8.6* 8.3* 8.2*   Liver Function Tests: Recent Labs  Lab 09/28/17 0721  AST  14*  ALT 13*  ALKPHOS 81  BILITOT 0.7  PROT 6.5  ALBUMIN 3.2*   No results for input(s): LIPASE, AMYLASE in the last 168 hours. No results for input(s): AMMONIA in the last 168 hours. CBC: Recent Labs  Lab 09/27/17 2203 09/28/17 0721 09/29/17 0535  WBC 11.2* 10.9* 9.4    NEUTROABS 7.2 6.5 5.7  HGB 13.6 12.0 12.2  HCT 43.0 37.8 39.6  MCV 96.8 96.2 98.8  PLT 289 278 293   Cardiac Enzymes: No results for input(s): CKTOTAL, CKMB, CKMBINDEX, TROPONINI in the last 168 hours. CBG (last 3)  No results for input(s): GLUCAP in the last 72 hours. Recent Results (from the past 240 hour(s))  MRSA PCR Screening     Status: Abnormal   Collection Time: 09/28/17  9:57 AM  Result Value Ref Range Status   MRSA by PCR POSITIVE (A) NEGATIVE Final    Comment:        The GeneXpert MRSA Assay (FDA approved for NASAL specimens only), is one component of a comprehensive MRSA colonization surveillance program. It is not intended to diagnose MRSA infection nor to guide or monitor treatment for MRSA infections. RESULT CALLED TO, READ BACK BY AND VERIFIED WITH: ELLIS,J. AT 1413 ON 09/28/2017 BY BAUGHAM,M. Performed at [facility], [address] [place], [place] [address]   Aerobic Culture (superficial specimen)     Status: None (Preliminary result)   Collection Time: 09/28/17  2:00 PM  Result Value Ref Range Status   Specimen Description   Final    ABSCESS RIGHT LOWER ABDOMEN Performed at [facility], [address] [place], [place] [address]    Special Requests   Final    NONE Performed at [facility], [address], [place], [place] [address]    Gram Stain   Final    MODERATE WBC PRESENT, PREDOMINANTLY PMN FEW GRAM POSITIVE COCCI    Culture   Final    FEW STAPHYLOCOCCUS AUREUS CULTURE REINCUBATED FOR BETTER GROWTH Performed at [facility], [place] [address] [place], [place] [address]    Report Status PENDING  Incomplete     Studies: Dg Hip Unilat W Or Wo Pelvis 2-3 Views Left  Result Date: 09/27/2017 CLINICAL DATA:  Left posterior hip pain x1 week without known injury. EXAM: DG HIP (WITH OR WITHOUT PELVIS) 2-3V LEFT COMPARISON:  None. FINDINGS: AP view of the pelvis as well as AP and frog-leg views of the left hip are provided. No acute  displaced fracture or joint dislocation is seen. There is lower lumbar facet arthropathy at L4-5 and L5-S1. Bony pelvis appears intact. No suspicious osseous lesions. No soft tissue mass or mineralization. IMPRESSION: No acute osseous abnormality of the bony pelvis and either hip. Lower lumbar degenerative facet arthropathy. Electronically Signed   By: Ashley Royalty M.D.   On: 09/27/2017 22:52   Scheduled Meds: . atorvastatin  10 mg Oral QHS  . Chlorhexidine Gluconate Cloth  6 each Topical Q0600  . diltiazem  300 mg Oral Daily  . docusate sodium  100 mg Oral BID  . DULoxetine  60 mg Oral Daily  . fluticasone  2 spray Each Nare Daily  . gabapentin  300 mg Oral TID WC  . gabapentin  600 mg Oral QHS  . linaclotide  290 mcg Oral Daily  . lisinopril  40 mg Oral Daily  . loratadine  10 mg Oral Daily  . montelukast  10 mg Oral Daily  . mupirocin ointment  1  application Nasal BID  . oxybutynin  5 mg Oral BID  . pantoprazole  40 mg Oral Daily  . senna  2 tablet Oral Daily  . topiramate  50 mg Oral BID  . Warfarin - Pharmacist Dosing Inpatient   Does not apply q1800   Continuous Infusions: . sodium chloride 50 mL/hr at 09/29/17 0200  . doxycycline (VIBRAMYCIN) IV 100 mg (09/29/17 1730)    Principal Problem:   Cellulitis of abdominal wall Active Problems:   Hypertension   Atrial flutter (HCC)   Atrial fibrillation (HCC)   Sleep apnea   Anxiety   Coronary artery disease   Lower back pain   Seizures (HCC)   MRSA (methicillin resistant staph aureus) positive  Time spent: 33mins  Kathie Dike, MD,  [facility] Pager [phone]  If 7PM-7AM, please contact night-coverage [email or web address] Password [email or web address] 09/29/2017, 8:02 PM    LOS: 2 days

## 2017-09-29 NOTE — Progress Notes (Addendum)
[place] for Coumadin Indication: atrial fibrillation  Allergies  Allergen Reactions  . Vancomycin Hives and Itching  . Latex Rash  . Other Rash    HEART  MONITOR TAPE: severe rash, bleeding    Patient Measurements: Height: 5\' 5"  (165.1 cm) Weight: 278 lb 7.1 oz (126.3 kg) IBW/kg (Calculated) : 57  Vital Signs: Temp: 98 F (36.7 C) (02/20 0600) Temp Source: Oral (02/20 0600) BP: 131/68 (02/20 0600) Pulse Rate: 97 (02/20 0600)  Labs: Recent Labs    09/27/17 2203 09/27/17 2330 09/28/17 0721 09/29/17 0535  HGB 13.6  --  12.0 12.2  HCT 43.0  --  37.8 39.6  PLT 289  --  278 293  LABPROT  --   --  26.3* 29.6*  INR  --   --  2.44 2.83  CREATININE  --  0.62 0.57 0.56   Estimated Creatinine Clearance: 95 mL/min (by C-G formula based on SCr of 0.56 mg/dL).  Assessment: 62 yo female admitted with back pain. Patient on chronic Coumadin for afib. Pharmacy asked to manage.  Home regimen:  5mg  on Mondays and Thursdays only. 2.5mg  on all other days at 4pm.  INR is therapeutic this AM but trending up. No bleeding noted.  Goal of Therapy:  INR 2-3 Monitor platelets by anticoagulation protocol: Yes   Plan:  Coumadin 2mg  x 1 today Daily PT-INR Monitor for S/S of bleeding  Pricilla Larsson, [facility]  09/29/2017,12:16 PM

## 2017-09-30 DIAGNOSIS — G8929 Other chronic pain: Secondary | ICD-10-CM

## 2017-09-30 DIAGNOSIS — M5440 Lumbago with sciatica, unspecified side: Secondary | ICD-10-CM

## 2017-09-30 LAB — CBC WITH DIFFERENTIAL/PLATELET
BASOS PCT: 0 %
Basophils Absolute: 0 10*3/uL (ref 0.0–0.1)
Eosinophils Absolute: 0.2 10*3/uL (ref 0.0–0.7)
Eosinophils Relative: 3 %
HEMATOCRIT: 38.9 % (ref 36.0–46.0)
HEMOGLOBIN: 11.9 g/dL — AB (ref 12.0–15.0)
Lymphocytes Relative: 25 %
Lymphs Abs: 2.1 10*3/uL (ref 0.7–4.0)
MCH: 30.1 pg (ref 26.0–34.0)
MCHC: 30.6 g/dL (ref 30.0–36.0)
MCV: 98.5 fL (ref 78.0–100.0)
MONOS PCT: 13 %
Monocytes Absolute: 1.1 10*3/uL — ABNORMAL HIGH (ref 0.1–1.0)
NEUTROS ABS: 4.8 10*3/uL (ref 1.7–7.7)
NEUTROS PCT: 59 %
Platelets: 288 10*3/uL (ref 150–400)
RBC: 3.95 MIL/uL (ref 3.87–5.11)
RDW: 15.6 % — ABNORMAL HIGH (ref 11.5–15.5)
WBC: 8.2 10*3/uL (ref 4.0–10.5)

## 2017-09-30 LAB — PROTIME-INR
INR: 2.34
Prothrombin Time: 25.4 seconds — ABNORMAL HIGH (ref 11.4–15.2)

## 2017-09-30 MED ORDER — DOXYCYCLINE HYCLATE 100 MG PO CAPS: 100.0000 mg | ORAL_CAPSULE | Freq: Two times a day (BID) | ORAL | 0 refills | Status: AC

## 2017-09-30 NOTE — Plan of Care (Signed)
  Acute Rehab PT Goals(only PT should resolve) Pt Will Go Supine/Side To Sit 09/30/2017 1211 - Progressing by Lonell Grandchild, PT Flowsheets Taken 09/30/2017 1211  Pt will go Supine/Side to Sit with minimal assist Patient Will Transfer Sit To/From Stand 09/30/2017 1211 - Progressing by Lonell Grandchild, PT Flowsheets Taken 09/30/2017 1211  Patient will transfer sit to/from stand with minimal assist Pt Will Transfer Bed To Chair/Chair To Bed 09/30/2017 1211 - Progressing by Lonell Grandchild, PT Flowsheets Taken 09/30/2017 1211  Pt will Transfer Bed to Chair/Chair to Bed with min assist Pt Will Ambulate 09/30/2017 1211 - Progressing by Lonell Grandchild, PT Flowsheets Taken 09/30/2017 1211  Pt will Ambulate 25 feet;with minimal assist;with cane Note With SPC, hemi-walker or quad cane if patient can have hers brought to hospital  12:13 PM, 09/30/17 Lonell Grandchild, MPT Physical Therapist with [facility] [phone] office [phone] mobile phone

## 2017-09-30 NOTE — NC FL2 (Signed)
[place] LEVEL OF CARE SCREENING TOOL     IDENTIFICATION  Patient Name: Deborah Maddox Birthdate: 1956/06/18 Sex: female Admission Date (Current Location): 09/27/2017  [facility] and [place] Number:      Facility and Address:  [facility] [address] [place]      Provider Number: [id number]  Attending Physician Name and Address:  Kathie Dike, MD  Relative Name and Phone Number:       Current Level of Care: Hospital Recommended Level of Care: [place] Prior Approval Number:    Date Approved/Denied:   PASRR Number:    Discharge Plan: SNF    Current Diagnoses: Patient Active Problem List   Diagnosis Date Noted  . Lower back pain 09/28/2017  . Seizures ([place]) 09/28/2017  . MRSA (methicillin resistant staph aureus) positive 09/28/2017  . Cellulitis of abdominal wall 09/27/2017  . Sleep apnea 09/27/2017  . Anxiety 09/27/2017  . Coronary artery disease 09/27/2017  . Foot drop 06/01/2017  . Encounter for therapeutic drug monitoring 09/14/2013  . Long term (current) use of anticoagulants 12/14/2012  . Atrial fibrillation ([place]) 12/14/2012  . Acute pulmonary embolism ([place]) 12/05/2012  . Atrial flutter ([place]) 12/05/2012  . Unspecified constipation 09/28/2012  . Rectal bleeding 09/28/2012  . Heart palpitations 02/01/2012  . Hypertension 02/01/2012  . Chest pain at rest 02/01/2012    Orientation RESPIRATION BLADDER Height & Weight     Self, Time, Situation, Place  Normal Continent Weight: 278 lb 7.1 oz (126.3 kg) Height:  5\' 5"  (165.1 cm)  BEHAVIORAL SYMPTOMS/MOOD NEUROLOGICAL BOWEL NUTRITION STATUS      Continent Diet(Heart Healthy)  AMBULATORY STATUS COMMUNICATION OF NEEDS Skin   Limited Assist Verbally Other (Comment)(wound/pimple: abdomen, lower, right)                       Personal Care Assistance Level of Assistance  Bathing, Feeding, Dressing Bathing Assistance: Limited assistance Feeding  assistance: Independent Dressing Assistance: Limited assistance     Functional Limitations Info  Sight, Hearing, Speech Sight Info: Adequate Hearing Info: Adequate Speech Info: Adequate    SPECIAL CARE FACTORS FREQUENCY  PT (By licensed PT)     PT Frequency: 5x/week               Contractures Contractures Info: Not present    Additional Factors Info  Code Status, Allergies, Psychotropic, Isolation Precautions Code Status Info: Full code Allergies Info: Vancomycin, Latex, Other Psychotropic Info: Cymbalta   Isolation Precautions Info: MRSA + by PCR 09/28/2017     Current Medications (09/30/2017):  This is the current hospital active medication list Current Facility-Administered Medications  Medication Dose Route Frequency Provider Last Rate Last Dose  . 0.9 %  sodium chloride infusion   Intravenous Continuous Reubin Milan, MD 50 mL/hr at 09/29/17 0200    . acetaminophen (TYLENOL) tablet 650 mg  650 mg Oral Q6H PRN Reubin Milan, MD   650 mg at 09/29/17 2102   Or  . acetaminophen (TYLENOL) suppository 650 mg  650 mg Rectal Q6H PRN Reubin Milan, MD      . atorvastatin (LIPITOR) tablet 10 mg  10 mg Oral QHS Reubin Milan, MD   10 mg at 09/29/17 2153  . Chlorhexidine Gluconate Cloth 2 % PADS 6 each  6 each Topical Q0600 Murlean Iba, MD   6 each at 09/30/17 0524  . diltiazem (TIAZAC) 24 hr capsule 300 mg  300 mg  Oral Daily Reubin Milan, MD   300 mg at 09/30/17 1051  . docusate sodium (COLACE) capsule 100 mg  100 mg Oral BID Reubin Milan, MD   100 mg at 09/30/17 1051  . doxycycline (VIBRAMYCIN) 100 mg in sodium chloride 0.9 % 250 mL IVPB  100 mg Intravenous Q12H Murlean Iba, MD   Stopped at 09/30/17 0831  . DULoxetine (CYMBALTA) DR capsule 60 mg  60 mg Oral Daily Reubin Milan, MD   60 mg at 09/30/17 1051  . fluticasone (FLONASE) 50 MCG/ACT nasal spray 2 spray  2 spray Each Nare Daily Reubin Milan, MD   2 spray  at 09/30/17 1050  . furosemide (LASIX) tablet 20 mg  20 mg Oral Daily PRN Reubin Milan, MD      . gabapentin (NEURONTIN) capsule 300 mg  300 mg Oral TID WC Johnson, Clanford L, MD   300 mg at 09/30/17 1153  . gabapentin (NEURONTIN) capsule 600 mg  600 mg Oral QHS Johnson, Clanford L, MD   600 mg at 09/29/17 2153  . linaclotide (LINZESS) capsule 290 mcg  290 mcg Oral Daily Johnson, Clanford L, MD   290 mcg at 09/30/17 1051  . lisinopril (PRINIVIL,ZESTRIL) tablet 40 mg  40 mg Oral Daily Reubin Milan, MD   40 mg at 09/30/17 1051  . loratadine (CLARITIN) tablet 10 mg  10 mg Oral Daily Reubin Milan, MD   10 mg at 09/30/17 1051  . montelukast (SINGULAIR) tablet 10 mg  10 mg Oral Daily Reubin Milan, MD   10 mg at 09/30/17 1051  . morphine 2 MG/ML injection 1 mg  1 mg Intravenous Q4H PRN Wynetta Emery, Clanford L, MD   1 mg at 09/28/17 1914  . mupirocin ointment (BACTROBAN) 2 % 1 application  1 application Nasal BID Wynetta Emery, Clanford L, MD   1 application at 47/09/62 1050  . ondansetron (ZOFRAN) tablet 4 mg  4 mg Oral Q6H PRN Reubin Milan, MD       Or  . ondansetron [facility]) injection 4 mg  4 mg Intravenous Q6H PRN Reubin Milan, MD   4 mg at 09/28/17 1914  . oxybutynin (DITROPAN) tablet 5 mg  5 mg Oral BID Reubin Milan, MD   5 mg at 09/30/17 1051  . pantoprazole (PROTONIX) EC tablet 40 mg  40 mg Oral Daily Reubin Milan, MD   40 mg at 09/30/17 1051  . senna (SENOKOT) tablet 17.2 mg  2 tablet Oral Daily Reubin Milan, MD   17.2 mg at 09/30/17 1051  . topiramate (TOPAMAX) tablet 50 mg  50 mg Oral BID Reubin Milan, MD   50 mg at 09/30/17 1051  . Warfarin - Pharmacist Dosing Inpatient   Does not apply q1800 Murlean Iba, MD         Discharge Medications: Please see discharge summary for a list of discharge medications.  Relevant Imaging Results:  Relevant Lab Results:   Additional Information SSN [id number], Clydene Pugh,  LCSW

## 2017-09-30 NOTE — Evaluation (Signed)
Physical Therapy Evaluation Patient Details Name: Deborah Maddox MRN: [id number] DOB: [birth year] Today's Date: 09/30/2017   History of Present Illness  Deborah Maddox is a 62 y.o. female with medical history significant of anxiety, osteoarthritis, cervical cancer, ventricular septal defect, CAD, left foot drop, Hypertension, seizures (last one many years ago), sleep apnea on CPAP, history of stroke at age 30 with left-sided weakness who is coming to the emergency department with complaints of 2-week history of left-sided lower back pain radiating to her lower extremity and right lower quadrant abscess minimally tender, with calor and surrounding erythema.  She denies fever, chills, nausea, emesis, fatigue or malaise.  No runny nose, sore throat, dyspnea, wheezing, hemoptysis, chest pain, palpitations, dizziness, diaphoresis, PND orthopnea.  Denies abdominal pain, diarrhea, constipation, melena or hematochezia.  Denies dysuria, frequency or hematuria.    Clinical Impression  Patient requires much time to attempt/complete functional tasks due to generalized weakness and fear of falling, limited to a couple of shuffling steps when attempting to transfer to chair, mostly limited due to c/o fatigue and poor standing balance.  Patient will benefit from continued physical therapy in hospital and recommended venue below to increase strength, balance, endurance for safe ADLs and gait.    Follow Up Recommendations SNF;Supervision/Assistance - 24 hour    Equipment Recommendations  None recommended by PT    Recommendations for Other Services       Precautions / Restrictions Precautions Precautions: Fall Required Braces or Orthoses: Other Brace/Splint Other Brace/Splint: Left AFO with shoe Restrictions Weight Bearing Restrictions: No      Mobility  Bed Mobility Overal bed mobility: Needs Assistance Bed Mobility: Supine to Sit;Sit to Supine     Supine to sit: Mod assist Sit to supine: Min assist;Mod  assist   General bed mobility comments: requires much time to position self in bed using RUE before pulling self up with RUE  Transfers Overall transfer level: Needs assistance Equipment used: Straight cane Transfers: Sit to/from Stand Sit to Stand: Mod assist;Max assist            Ambulation/Gait Ambulation/Gait assistance: Max assist;Mod assist Ambulation Distance (Feet): 2 Feet Assistive device: Straight cane Gait Pattern/deviations: Decreased step length - right;Decreased step length - left;Decreased stance time - left;Decreased stride length   Gait velocity interpretation: Below normal speed for age/gender General Gait Details: patient limited to a few unsteady side steps with mostly dragging of LLE due to generalized weakness and poor standing balance  Stairs            Wheelchair Mobility    Modified Rankin (Stroke Patients Only)       Balance Overall balance assessment: Needs assistance Sitting-balance support: Feet supported;No upper extremity supported Sitting balance-Leahy Scale: Good     Standing balance support: Single extremity supported;During functional activity Standing balance-Leahy Scale: Poor Standing balance comment: using SPC                             Pertinent Vitals/Pain Pain Assessment: 0-10 Pain Score: 2  Pain Location: left side of lower abomen at site of abcess Pain Descriptors / Indicators: Aching Pain Intervention(s): Limited activity within patient's tolerance    Home Living Family/patient expects to be discharged to:: Group home                      Prior Function Level of Independence: Needs assistance   Gait / Transfers Assistance Needed: Ambulate  short household distances with quad-cane and LLE AFO, uses wheelchair provided by facility most of time  ADL's / Homemaking Assistance Needed: assisted for showers by group home staff        Hand Dominance        Extremity/Trunk Assessment    Upper Extremity Assessment Upper Extremity Assessment: RUE deficits/detail;LUE deficits/detail RUE Deficits / Details: grossly 4/5 LUE Deficits / Details: grossly 0/5, non functional since CVA LUE Sensation: decreased proprioception;decreased light touch    Lower Extremity Assessment Lower Extremity Assessment: Generalized weakness;RLE deficits/detail;LLE deficits/detail RLE Deficits / Details: grossly 4/5 LLE Deficits / Details: grossly -3/5 except left dorsiflexion 0/5 due to drop foot since CVA LLE Sensation: decreased proprioception;decreased light touch    Cervical / Trunk Assessment Cervical / Trunk Assessment: Normal  Communication   Communication: No difficulties  Cognition Arousal/Alertness: Awake/alert Behavior During Therapy: WFL for tasks assessed/performed Overall Cognitive Status: Within Functional Limits for tasks assessed                                        General Comments      Exercises     Assessment/Plan    PT Assessment Patient needs continued PT services  PT Problem List Decreased strength;Decreased activity tolerance;Decreased balance;Decreased mobility       PT Treatment Interventions Gait training;Functional mobility training;Therapeutic activities;Therapeutic exercise;Patient/family education    PT Goals (Current goals can be found in the Care Plan section)  Acute Rehab PT Goals Patient Stated Goal: return to group home at [facility] PT Goal Formulation: With patient Time For Goal Achievement: 10/21/17 Potential to Achieve Goals: Good    Frequency Min 3X/week   Barriers to discharge        Co-evaluation               AM-PAC PT "6 Clicks" Daily Activity  Outcome Measure Difficulty turning over in bed (including adjusting bedclothes, sheets and blankets)?: A Little Difficulty moving from lying on back to sitting on the side of the bed? : A Lot Difficulty sitting down on and standing up from a chair with arms (e.g.,  wheelchair, bedside commode, etc,.)?: A Lot Help needed moving to and from a bed to chair (including a wheelchair)?: A Lot Help needed walking in hospital room?: A Lot Help needed climbing 3-5 steps with a railing? : Total 6 Click Score: 12    End of Session   Activity Tolerance: Patient tolerated treatment well;Patient limited by fatigue Patient left: in bed;with call bell/phone within reach Nurse Communication: Mobility status PT Visit Diagnosis: Unsteadiness on feet (R26.81);Other abnormalities of gait and mobility (R26.89);Muscle weakness (generalized) (M62.81)    Time: 8592-9244 PT Time Calculation (min) (ACUTE ONLY): 37 min   Charges:   PT Evaluation $PT Eval Moderate Complexity: 1 Mod PT Treatments $Therapeutic Activity: 23-37 mins   PT G Codes:        12:09 PM, 10/01/2017 Lonell Grandchild, MPT Physical Therapist with [facility] [phone] office [phone] mobile phone

## 2017-09-30 NOTE — Clinical Social Work Note (Addendum)
Patient's facility notified of patient going to SNF.   No answer was received when attempt was made to call patient's sister, Maryann Alar.   LCSW notified [facility] and sent discharge clinicals. LCSW signing off.   LCSW signing off.     Yarrow Linhart, Clydene Pugh, LCSW

## 2017-09-30 NOTE — Care Management Important Message (Signed)
Important Message  Patient Details  Name: COURTLAND COPPA MRN: [id number] Date of Birth: 10/28/1955   Medicare Important Message Given:  Yes    Jinnifer Montejano, Chauncey Reading, RN 09/30/2017, 3:31 PM

## 2017-09-30 NOTE — Clinical Social Work Note (Signed)
Clinical Social Work Assessment  Patient Details  Name: Deborah Maddox MRN: [id number] Date of Birth: 1956/02/09  Date of referral:  09/30/17               Reason for consult:  Facility Placement                Permission sought to share information with:    Permission granted to share information::     Name::        Agency::  Maudie Mercury at [facility]   Relationship::     Contact Information:     Housing/Transportation Living arrangements for the past 2 months:  [place] of Information:  Patient Patient Interpreter Needed:  None Criminal Activity/Legal Involvement Pertinent to Current Situation/Hospitalization:  No - Comment as needed Significant Relationships:  Other Family Members Lives with:  Facility Resident Do you feel safe going back to the place where you live?  Yes Need for family participation in patient care:  No (Coment)  Care giving concerns:  Facility resident.    Social Worker assessment / plan:  Maudie Mercury at [facility] advised that patient has been a resident at the facility for years. She requires assistance with bathing and dressing. Patient uses a wheelchair.  Patient is incontinent sometimes, but will indicate when she has had an accident.  Patient can return at discharge.  Patient confirmed statements. Patient is agreeable to SNF as a result of PT recommendation.   Employment status:  Retired Forensic scientist:  Medicare PT Recommendations:  [place] / Referral to community resources:  [place]  Patient/Family's Response to care:  Patient is agreeable to SNF.   Patient/Family's Understanding of and Emotional Response to Diagnosis, Current Treatment, and Prognosis:  Patient understands her diagnosis, treatment and prognosis.   Emotional Assessment Appearance:  Appears stated age Attitude/Demeanor/Rapport:    Affect (typically observed):  Accepting Orientation:  Oriented to Self, Oriented to Place,  Oriented to  Time, Oriented to Situation Alcohol / Substance use:  Not Applicable Psych involvement (Current and /or in the community):  No (Comment)  Discharge Needs  Concerns to be addressed:  Discharge Planning Concerns Readmission within the last 30 days:  No Current discharge risk:  None Barriers to Discharge:  No Barriers Identified   Ihor Gully, LCSW 09/30/2017, 3:35 PM

## 2017-09-30 NOTE — Discharge Summary (Signed)
Physician Discharge Summary  Deborah Maddox AOZ:[id number] DOB: 1955-11-02 DOA: 09/27/2017  PCP: Jani Gravel, MD  Admit date: 09/27/2017 Discharge date: 09/30/2017  Admitted From: Assisted living Disposition: Skilled nursing facility  Recommendations for Outpatient Follow-up:  1. Follow up with PCP in 1-2 weeks 2. 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 She denies fever, chills, nausea, emesis, fatigue or malaise. No runny nose, sore throat, dyspnea, wheezing, hemoptysis, chest pain, palpitations, dizziness, diaphoresis, PND orthopnea. Denies abdominal pain, diarrhea, constipation, melena or hematochezia. Denies dysuria, frequency or hematuria.  Discharge Diagnoses:  Principal Problem:   Cellulitis of abdominal wall Active Problems:   Hypertension   Atrial flutter (HCC)   Atrial fibrillation (HCC)   Sleep apnea   Anxiety   Coronary artery disease   Lower back pain   Seizures (HCC)   MRSA (methicillin resistant staph aureus) positive  1. Cellulitis/abscess of the abdominal wall-  patient was treated with intravenous antibiotics.  She was seen by general surgery and purulent fluid was aspirated at the bedside.  Her overall erythema/pain/swelling in right lower quadrant abdominal wall has improved.  She is no longer having any purulent discharge.  Erythema has significantly retracted  from demarcated margins.  Will continue on oral doxycycline.. 2. Lower back pain- chronic condition.  Continue chronic pain management. 3. Essential hypertension- blood pressure stable.  Continue on diltiazem and lisinopril.. 4. A. fib/flutter-stable and controlled-  rate controlled on diltiazem.  Anticoagulated with warfarin. 5. Sleep apnea-patient declined CPAP. 6. Epilepsy- stable.    Continue on topiramate and gabapentin 7. CAD-stable, no complaints of chest pain.  Continue current treatments.     Discharge Instructions   Allergies as of 09/30/2017      Reactions   Vancomycin Hives, Itching   Latex Rash   Other Rash   HEART  MONITOR TAPE: severe rash, bleeding      Medication List    TAKE these medications   atorvastatin 10 MG tablet Commonly known as:  LIPITOR Take 10 mg by mouth at bedtime.   clotrimazole-betamethasone cream Commonly known as:  LOTRISONE Apply 1 application topically 2 (two) times daily as needed.   cyanocobalamin 1000 MCG/ML injection Commonly known as:  (VITAMIN B-12) Inject 1,000 mcg into the muscle every 30 (thirty) days. 15th of each month   diclofenac sodium 1 % Gel Commonly known as:  VOLTAREN Apply 2-4 g topically 3 (three) times daily. to knees   diltiazem 300 MG 24 hr capsule Commonly known as:  TIAZAC Take 300 mg by mouth daily.   docusate sodium 100 MG capsule Commonly known as:  COLACE Take 100 mg by mouth 2 (two) times daily.   doxycycline 100 MG capsule Commonly known as:  VIBRAMYCIN Take 1 capsule (100 mg total) by mouth 2 (two) times daily for 7 days.   DULoxetine 60 MG capsule Commonly known as:  CYMBALTA Take 60 mg by mouth daily.   fluticasone 50 MCG/ACT nasal spray Commonly known as:  FLONASE  Place 2 sprays into both nostrils daily.   furosemide 20 MG tablet Commonly known as:  LASIX Take 20 mg by mouth daily as needed for fluid.   gabapentin 300 MG capsule Commonly known as:  NEURONTIN Take 300-600 mg by  mouth See admin instructions. Takes 300 mg three times daily and 600 mg at bedtime.   linaclotide 290 MCG Caps capsule Commonly known as:  LINZESS Take 1 capsule by mouth daily. 30 minutes before breakfast.   lisinopril 40 MG tablet Commonly known as:  PRINIVIL,ZESTRIL Take 40 mg by mouth daily.   loratadine 10 MG tablet Commonly known as:  CLARITIN Take 10 mg by mouth daily.   montelukast 10 MG tablet Commonly known as:  SINGULAIR Take 10 mg by mouth daily.   omeprazole 20 MG capsule Commonly known as:  PRILOSEC Take 20 mg by mouth daily.   oxybutynin 5 MG tablet Commonly known as:  DITROPAN Take 5 mg by mouth 2 (two) times daily.   senna 8.6 MG Tabs tablet Commonly known as:  SENOKOT Take 2 tablets by mouth daily.   topiramate 50 MG tablet Commonly known as:  TOPAMAX Take 50 mg by mouth 2 (two) times daily.   Vitamin D 2000 units Caps Take 1 capsule by mouth daily.   warfarin 2.5 MG tablet Commonly known as:  COUMADIN Take as directed. If you are unsure how to take this medication, talk to your nurse or doctor. Original instructions:  Take 2.5-5 mg by mouth See admin instructions. 5mg  on Mondays and Thursdays only. 2.5mg  on all other days at 4pm       Allergies  Allergen Reactions  . Vancomycin Hives and Itching  . Latex Rash  . Other Rash    HEART  MONITOR TAPE: severe rash, bleeding    Consultations:  General surgery   Procedures/Studies: Dg Hip Unilat W Or Wo Pelvis 2-3 Views Left  Result Date: 09/27/2017 CLINICAL DATA:  Left posterior hip pain x1 week without known injury. EXAM: DG HIP (WITH OR WITHOUT PELVIS) 2-3V LEFT COMPARISON:  None. FINDINGS: AP view of the pelvis as well as AP and frog-leg views of the left hip are provided. No acute displaced fracture or joint dislocation is seen. There is lower lumbar facet arthropathy at L4-5 and L5-S1. Bony pelvis appears intact. No suspicious osseous lesions. No soft tissue mass or mineralization.  IMPRESSION: No acute osseous abnormality of the bony pelvis and either hip. Lower lumbar degenerative facet arthropathy. Electronically Signed   By: Ashley Royalty M.D.   On: 09/27/2017 22:52       Subjective: Feels better today.  Overall pain in abdomen is doing better.  Discharge Exam: Vitals:   09/29/17 2100 09/30/17 0404  BP: 118/60 (!) 108/50  Pulse: 94 92  Resp: 20 17  Temp: 99.1 F (37.3 C) 98.5 F (36.9 C)  SpO2: 94% 97%   Vitals:   09/29/17 0600 09/29/17 1326 09/29/17 2100 09/30/17 0404  BP: 131/68 115/63 118/60 (!) 108/50  Pulse: 97 100 94 92  Resp:  18 20 17   Temp: 98 F (36.7 C) 98.3 F (36.8 C) 99.1 F (37.3 C) 98.5 F (36.9 C)  TempSrc: Oral Oral Oral   SpO2: 99% 100% 94% 97%  Weight:      Height:        General: Pt is alert, awake, not in acute distress Cardiovascular: RRR, S1/S2 +, no rubs, no gallops Respiratory: CTA bilaterally, no wheezing, no rhonchi Abdominal: Soft, NT, ND, bowel  sounds + Extremities: no edema, no cyanosis Skin: Area of erythema has significantly improved, retracted from demarcated margins.  No purulent fluid expressed.    The results of significant diagnostics from this hospitalization (including imaging, microbiology, ancillary and laboratory) are listed below for reference.     Microbiology: Recent Results (from the past 240 hour(s))  MRSA PCR Screening     Status: Abnormal   Collection Time: 09/28/17  9:57 AM  Result Value Ref Range Status   MRSA by PCR POSITIVE (A) NEGATIVE Final    Comment:        The GeneXpert MRSA Assay (FDA approved for NASAL specimens only), is one component of a comprehensive MRSA colonization surveillance program. It is not intended to diagnose MRSA infection nor to guide or monitor treatment for MRSA infections. RESULT CALLED TO, READ BACK BY AND VERIFIED WITH: ELLIS,J. AT 1413 ON 09/28/2017 BY BAUGHAM,M. Performed at [facility] [address], [place], [place] [address]   Aerobic  Culture (superficial specimen)     Status: None (Preliminary result)   Collection Time: 09/28/17  2:00 PM  Result Value Ref Range Status   Specimen Description   Final    ABSCESS RIGHT LOWER ABDOMEN Performed at [facility], [address] [place], [place] [address]    Special Requests   Final    NONE Performed at [facility], [address], [place], [place] [address]    Gram Stain   Final    MODERATE WBC PRESENT, PREDOMINANTLY PMN FEW GRAM POSITIVE COCCI    Culture   Final    FEW STAPHYLOCOCCUS AUREUS CULTURE REINCUBATED FOR BETTER GROWTH Performed at [facility], [place] [address], [place], [place] [address]    Report Status PENDING  Incomplete     Labs: BNP (last 3 results) No results for input(s): BNP in the last 8760 hours. Basic Metabolic Panel: Recent Labs  Lab 09/27/17 2330 09/28/17 0721 09/29/17 0535  NA 140 139 140  K 3.9 3.8 3.9  CL 108 108 109  CO2 23 22 22   GLUCOSE 124* 115* 112*  BUN 15 17 15   CREATININE 0.62 0.57 0.56  CALCIUM 8.6* 8.3* 8.2*   Liver Function Tests: Recent Labs  Lab 09/28/17 0721  AST 14*  ALT 13*  ALKPHOS 81  BILITOT 0.7  PROT 6.5  ALBUMIN 3.2*   No results for input(s): LIPASE, AMYLASE in the last 168 hours. No results for input(s): AMMONIA in the last 168 hours. CBC: Recent Labs  Lab 09/27/17 2203 09/28/17 0721 09/29/17 0535 09/30/17 0626  WBC 11.2* 10.9* 9.4 8.2  NEUTROABS 7.2 6.5 5.7 4.8  HGB 13.6 12.0 12.2 11.9*  HCT 43.0 37.8 39.6 38.9  MCV 96.8 96.2 98.8 98.5  PLT 289 278 293 288   Cardiac Enzymes: No results for input(s): CKTOTAL, CKMB, CKMBINDEX, TROPONINI in the last 168 hours. BNP: Invalid input(s): POCBNP CBG: No results for input(s): GLUCAP in the last 168 hours. D-Dimer No results for input(s): DDIMER in the last 72 hours. Hgb A1c No results for input(s): HGBA1C in the last 72 hours. Lipid Profile No results for input(s): CHOL, HDL, LDLCALC, TRIG, CHOLHDL, LDLDIRECT in the last 72  hours. Thyroid function studies No results for input(s): TSH, T4TOTAL, T3FREE, THYROIDAB in the last 72 hours.  Invalid input(s): FREET3 Anemia work up No results for input(s): VITAMINB12, FOLATE, FERRITIN, TIBC, IRON, RETICCTPCT in the last 72 hours. Urinalysis    Component Value Date/Time   COLORURINE YELLOW 05/31/2015 0235  APPEARANCEUR CLEAR 05/31/2015 0235   LABSPEC <1.005 (L) 05/31/2015 0235   PHURINE 6.5 05/31/2015 0235   GLUCOSEU NEGATIVE 05/31/2015 0235   HGBUR NEGATIVE 05/31/2015 0235   BILIRUBINUR NEGATIVE 05/31/2015 0235   KETONESUR NEGATIVE 05/31/2015 0235   PROTEINUR NEGATIVE 05/31/2015 0235   UROBILINOGEN 0.2 05/31/2015 0235   NITRITE NEGATIVE 05/31/2015 0235   LEUKOCYTESUR NEGATIVE 05/31/2015 0235   Sepsis Labs Invalid input(s): PROCALCITONIN,  WBC,  LACTICIDVEN Microbiology Recent Results (from the past 240 hour(s))  MRSA PCR Screening     Status: Abnormal   Collection Time: 09/28/17  9:57 AM  Result Value Ref Range Status   MRSA by PCR POSITIVE (A) NEGATIVE Final    Comment:        The GeneXpert MRSA Assay (FDA approved for NASAL specimens only), is one component of a comprehensive MRSA colonization surveillance program. It is not intended to diagnose MRSA infection nor to guide or monitor treatment for MRSA infections. RESULT CALLED TO, READ BACK BY AND VERIFIED WITH: ELLIS,J. AT 1413 ON 09/28/2017 BY BAUGHAM,M. Performed at [facility], [address] [place], [place] [address]   Aerobic Culture (superficial specimen)     Status: None (Preliminary result)   Collection Time: 09/28/17  2:00 PM  Result Value Ref Range Status   Specimen Description   Final    ABSCESS RIGHT LOWER ABDOMEN Performed at [facility], [address], [place], [place] [address]    Special Requests   Final    NONE Performed at [facility], [address] [place], [place] [address]    Gram Stain   Final    MODERATE WBC PRESENT, PREDOMINANTLY PMN FEW GRAM POSITIVE  COCCI    Culture   Final    FEW STAPHYLOCOCCUS AUREUS CULTURE REINCUBATED FOR BETTER GROWTH Performed at [facility], [place] [address] [place], [place] [address]    Report Status PENDING  Incomplete     Time coordinating discharge: Over 30 minutes  SIGNED:   Kathie Dike, MD  [facility] 09/30/2017, 1:11 PM Pager   If 7PM-7AM, please contact night-coverage [email or web address] Password [email or web address]

## 2017-10-01 ENCOUNTER — Encounter (HOSPITAL_COMMUNITY): Admission: RE | Admit: 2017-10-01 | Payer: Medicare Other | Source: Skilled Nursing Facility | Admitting: *Deleted

## 2017-10-01 ENCOUNTER — Inpatient Hospital Stay
Admission: RE | Admit: 2017-10-01 | Discharge: 2017-10-22 | Disposition: A | Discharge status: Skilled Nursing Facility | Payer: Medicare Other | Source: Ambulatory Visit | Attending: Internal Medicine | Admitting: Internal Medicine

## 2017-10-01 ENCOUNTER — Non-Acute Institutional Stay (SKILLED_NURSING_FACILITY): Payer: Medicare Other | Admitting: Internal Medicine

## 2017-10-01 DIAGNOSIS — Z8673 Personal history of transient ischemic attack (TIA), and cerebral infarction without residual deficits: Secondary | ICD-10-CM

## 2017-10-01 DIAGNOSIS — K5900 Constipation, unspecified: Secondary | ICD-10-CM

## 2017-10-01 DIAGNOSIS — R569 Unspecified convulsions: Secondary | ICD-10-CM

## 2017-10-01 DIAGNOSIS — L03311 Cellulitis of abdominal wall: Secondary | ICD-10-CM

## 2017-10-01 DIAGNOSIS — I4891 Unspecified atrial fibrillation: Secondary | ICD-10-CM

## 2017-10-01 DIAGNOSIS — I10 Essential (primary) hypertension: Secondary | ICD-10-CM | POA: Diagnosis not present

## 2017-10-01 LAB — AEROBIC CULTURE  (SUPERFICIAL SPECIMEN)

## 2017-10-01 LAB — CBC WITH DIFFERENTIAL/PLATELET
BASOS ABS: 0 10*3/uL (ref 0.0–0.1)
Basophils Relative: 0 %
EOS ABS: 0.3 10*3/uL (ref 0.0–0.7)
EOS PCT: 3 %
HCT: 36.5 % (ref 36.0–46.0)
Hemoglobin: 11.3 g/dL — ABNORMAL LOW (ref 12.0–15.0)
Lymphocytes Relative: 29 %
Lymphs Abs: 2.7 10*3/uL (ref 0.7–4.0)
MCH: 30.4 pg (ref 26.0–34.0)
MCHC: 31 g/dL (ref 30.0–36.0)
MCV: 98.1 fL (ref 78.0–100.0)
MONO ABS: 1.3 10*3/uL — AB (ref 0.1–1.0)
Monocytes Relative: 14 %
Neutro Abs: 5.1 10*3/uL (ref 1.7–7.7)
Neutrophils Relative %: 54 %
PLATELETS: 296 10*3/uL (ref 150–400)
RBC: 3.72 MIL/uL — AB (ref 3.87–5.11)
RDW: 15.5 % (ref 11.5–15.5)
WBC: 9.5 10*3/uL (ref 4.0–10.5)

## 2017-10-01 LAB — PROTIME-INR
INR: 2
PROTHROMBIN TIME: 22.5 s — AB (ref 11.4–15.2)

## 2017-10-01 LAB — AEROBIC CULTURE W GRAM STAIN (SUPERFICIAL SPECIMEN)

## 2017-10-01 NOTE — Progress Notes (Signed)
Report called to [facility].  Pt discharged to SNF in stable condition.

## 2017-10-01 NOTE — Progress Notes (Signed)
Patient did not go to SNF yesterday.  Patient seen and examined. She is complaining of back pain which she attributes to laying in bed. Vitals are stable overnight and she does not appear to be in distress  Patient was admitted to the hospital with cellulitis and abscess of right lower quadrant abdominal wall. This was aspirated by general surgery and she has been on antibiotics. She is improving. Plan is unchanged from discharge summary done yesterday. No new meds. Plan will be to discharge to SNF today if bed available.  Time: 23mins  [employer]

## 2017-10-02 ENCOUNTER — Encounter: Payer: Self-pay | Admitting: Internal Medicine

## 2017-10-02 ENCOUNTER — Encounter (HOSPITAL_COMMUNITY)
Admission: RE | Admit: 2017-10-02 | Discharge: 2017-10-02 | Disposition: A | Discharge status: Home / Self Care | Payer: Medicare Other | Source: Skilled Nursing Facility

## 2017-10-02 DIAGNOSIS — I482 Chronic atrial fibrillation: Secondary | ICD-10-CM | POA: Insufficient documentation

## 2017-10-02 LAB — PROTIME-INR
INR: 1.57
Prothrombin Time: 18.6 seconds — ABNORMAL HIGH (ref 11.4–15.2)

## 2017-10-02 NOTE — Progress Notes (Signed)
+ This is an acute visit.  Level of care skilled.  Facility is [facility].  Chief complaint acute visit follow-up hospitalization for abdominal wall cellulitis.  History of present illness.  Patient is a pleasant 62 year old female with a history of anxiety as well as osteoarthritis cervical cancer-ventricular septal defect coronary artery disease left foot drop hypertension seizures as well as sleep apnea on CPAP-she also has a history of CVA at age 27 with left-sided residual weakness.  She came to the ER complaining of a 2-week history of left-sided lower back pain that radiated to her lower extremity and right lower quadrant abscess with surrounding erythema  The abdominal cellulitis was treated with IV antibiotics initially she also was seen by surgery in purulent fluid was aspirated.  This did improve significantly- discharge it is no longer exists erythema has improved.  She has been discharged on a one-week course of oral doxycycline.  She has also had lower back pain thought to be chronic and continued on her current pain medications which include Neurontin as well as Cymbalta.  Regards to hypertension this was stable on diltiazem and lisinopril-she also has a history of A. fib a flutter's on diltiazem- she is also on Coumadin for anticoagulation INR today is 2.0--her target is 2.0-3.0.  She also has a history of seizure disorder is on Topamax as well as Neurontin.  Currently she has no complaints the erythema appears to be significantly improved on her abdomen-she is not complaining of any fever chills.  Past Medical History:  Diagnosis Date  . Anxiety   . Arthritis   . Cancer (HCC)    cervical  . 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 Coronary artery disease   . Foot drop    left foot  . Hypertension   . Left foot drop   . 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 PONV (postoperative nausea and vomiting)   . Seizures ([place])      last one 10 years ago  . Sleep apnea   . Stroke [facility])    at age 35, left sided weakness         Past Surgical History:  Procedure Laterality Date  . BREAST SURGERY    . CERVICAL CONE BIOPSY    . COLONOSCOPY  2006   Dr. Anthony Sar: normal colonoscopy   . COLONOSCOPY WITH PROPOFOL N/A 10/06/2012   Procedure: COLONOSCOPY WITH PROPOFOL;  Surgeon: Daneil Dolin, MD;  Location: [facility];  Service: Endoscopy;  Laterality: N/A;  entered cecum @ 0905; total cecal withdrawal time=  49minutes  . DILATION AND CURETTAGE OF UTERUS    . LEG SURGERY    . LEG SURGERY     left  . OPEN REDUCTION INTERNAL FIXATION (ORIF) HAND     right hand and arm  . POLYPECTOMY N/A 10/06/2012   Procedure: POLYPECTOMY;  Surgeon: Daneil Dolin, MD;  Location: [facility];  Service: Endoscopy;  Laterality: N/A;  hepatic flexure polyp  . TONSILLECTOMY       reports that she quit smoking about 36 years ago. She quit after 5.00 years of use. she has never used smokeless tobacco. She reports that she does not drink alcohol or use drugs.       Allergies  Allergen Reactions  . Vancomycin Hives and Itching  . Latex Rash  . Other Rash    HEART  MONITOR TAPE: severe rash, bleeding  Family History  Problem Relation Age of Onset  . Diabetes Mother   . Diabetes Sister   . Cancer Brother        "all over"   . Cancer Other   . Diabetes Other   . Colon cancer Unknown        unsure    Review of systems.  In general she is not complaining of any fever chills.  Skin does not complain of rashes or itching cellulitis abdominal wall appears to be resolving.  Head ears eyes nose mouth and throat does not complain of visual changes or sore throat.  Respiratory is not complaining of shortness of breath or cough.  Cardiac does not complain of chest pain has some lower extremity edema also left upper extremity edema with history of CVA.  GI is not complaining of abdominal pain  nausea vomiting diarrhea constipation does of course have the resolving cellulitis.  GU does not complain of dysuria.  Musculoskeletal is not complaining of joint pain does have left-sided hemiparalysis with history of CVA.  Has complained of back pain but is not currently complaining of that.  Neurologic does not complain of dizziness headache syncope does have a history of left-sided weakness with history of CVA.--Does have left foot drop  Psych does not complain of overt anxiety or depression appears to be in good spirits.  Physical exam.  Temperature is 98.0 pulse 83 respirations 18 blood pressure 114/50.  In general this is a somewhat obese middle-aged female in no distress lying comfortably in bed.  Her skin is warm and dry she does appear to have resolving erythema right lower abdomen there is a small area that is dressed this appears to have some scaling underneath-but there is no drainage this I suspect was the previous area of drainage .  Eyes she does have large pupils they do appear to be reactive visual acuity appears to be grossly intact sclera and conjunctive are clear.  Oropharynx is clear mucous membranes moist she has numerous extractions.  Chest is clear to auscultation there is no labored breathing.  Heart is regular irregular rate and rhythm without murmur gallop or rub she does have some left upper and lower extremity edema status post CVA this is cool to touch nonerythematous nontender.  Her abdomen is obese soft nontender with positive bowel sounds again she does have the erythematous changes which appear to be resolving as noted above.  Musculoskeletal does have left-sided hemiparalysis with contracture of her left hand does move her right upper and lower extremity limited exam since she is in bed.  Neurologic as noted above she does have a slight mouth droop her speech is clear otherwise cranial nerves appear to be grossly intact.  Psych she is alert and  oriented pleasant and appropriate.  Labs.  September 30, 2017.  INR 2.0.  WBC 9.5 hemoglobin 11.3 platelets 290.   February 20 2 04/10/2018-sodium 140 potassium 3.9 BUN 15 creatinine 0.56   Assessment and plan.  1.  History of cellulitis of abdominal wall continues on doxycycline for approximately 1 more week-this appears to be resolving she has been afebrile white count is within normal range will update a CBC with differential on Monday, February 25.  2.  History of CVA with left-sided weakness again she appears to have done well with supportive care she apparently had been living with somewhat of a group home assisted living situation and did well there.  3.  History of atrial fibrillation as well  as history of pulmonary embolism in the past- she is on Coumadin as well as diltiazem for rate control-INR is in therapeutic range but low  End---2.0 will update this tomorrow   #4- 3 of hypertension so far minimal readings with this appears to be stable stable in the hospital apparently she is on diltiazem and lisinopril will continue to monitor.  5.  History of seizure disorder currently distant past is on Neurontin as well as Topamax.  6.  History of coronary artery disease this appears relatively asymptomatic she is on a statin as well as anticoagulation with Coumadin.  7.  History of constipation she is on Linzess and Colace apparently this has been an issue in the past but is not complaining of that currently.  8.  History of osteoarthritis she is not complaining currently of back pain and apparently this has been an issue at times.  I do note she is on Neurontin as well as Cymbalta.  9.  History of anxiety with depression she has been on Cymbalta at this point appears stable but will need monitoring.  10.  History of allergic rhinitis continues on Claritin as well as Flonase and Singulair.  Again will update a CBC with differential on Monday as well as a metabolic panel-will  check an INR tomorrow-.  [id number]- of note greater than 40 minutes spent assessing patient-reviewing her chart labs- and coordinating and formulating a plan of care for numerous diagnoses- of note greater than 50% of time spent coordinating plan of care .

## 2017-10-03 ENCOUNTER — Encounter (HOSPITAL_COMMUNITY)
Admission: RE | Admit: 2017-10-03 | Discharge: 2017-10-03 | Disposition: A | Discharge status: Home / Self Care | Payer: Medicare Other | Source: Skilled Nursing Facility | Attending: Internal Medicine | Admitting: Internal Medicine

## 2017-10-03 LAB — PROTIME-INR
INR: 1.58
Prothrombin Time: 18.7 seconds — ABNORMAL HIGH (ref 11.4–15.2)

## 2017-10-04 ENCOUNTER — Encounter: Payer: Self-pay | Admitting: Internal Medicine

## 2017-10-04 ENCOUNTER — Encounter (HOSPITAL_COMMUNITY)
Admission: RE | Admit: 2017-10-04 | Discharge: 2017-10-04 | Disposition: A | Discharge status: Home / Self Care | Payer: Medicare Other | Source: Skilled Nursing Facility | Attending: *Deleted | Admitting: *Deleted

## 2017-10-04 LAB — CBC WITH DIFFERENTIAL/PLATELET
BASOS ABS: 0 10*3/uL (ref 0.0–0.1)
Basophils Relative: 1 %
EOS ABS: 0.3 10*3/uL (ref 0.0–0.7)
EOS PCT: 5 %
HCT: 40.7 % (ref 36.0–46.0)
Hemoglobin: 12.6 g/dL (ref 12.0–15.0)
Lymphocytes Relative: 34 %
Lymphs Abs: 2 10*3/uL (ref 0.7–4.0)
MCH: 30.1 pg (ref 26.0–34.0)
MCHC: 31 g/dL (ref 30.0–36.0)
MCV: 97.4 fL (ref 78.0–100.0)
Monocytes Absolute: 0.9 10*3/uL (ref 0.1–1.0)
Monocytes Relative: 15 %
Neutro Abs: 2.7 10*3/uL (ref 1.7–7.7)
Neutrophils Relative %: 45 %
Platelets: 356 10*3/uL (ref 150–400)
RBC: 4.18 MIL/uL (ref 3.87–5.11)
RDW: 15.5 % (ref 11.5–15.5)
WBC: 6 10*3/uL (ref 4.0–10.5)

## 2017-10-04 LAB — BASIC METABOLIC PANEL
Anion gap: 9 (ref 5–15)
BUN: 16 mg/dL (ref 6–20)
CO2: 25 mmol/L (ref 22–32)
CREATININE: 0.57 mg/dL (ref 0.44–1.00)
Calcium: 8.8 mg/dL — ABNORMAL LOW (ref 8.9–10.3)
Chloride: 108 mmol/L (ref 101–111)
GFR calc Af Amer: >60 mL/min (ref >60)
Glucose, Bld: 122 mg/dL — ABNORMAL HIGH (ref 65–99)
POTASSIUM: 4 mmol/L (ref 3.5–5.1)
SODIUM: 142 mmol/L (ref 135–145)

## 2017-10-04 LAB — PROTIME-INR
INR: 1.83
PROTHROMBIN TIME: 21 s — AB (ref 11.4–15.2)

## 2017-10-04 NOTE — Progress Notes (Signed)
Provider: Veleta Miners  Location:   [facility] Room Number: 158/P Place of Service:  SNF (31)  PCP: Jani Gravel, MD Patient Care Team: Jani Gravel, MD as PCP - General (Internal Medicine) Gala Romney [name], MD as Attending Physician (Gastroenterology)  Extended Emergency Contact Information Primary Emergency Contact: Taylor,Fern Address: [address] [place] [address] Johnnette Litter of [place] Phone: [phone] Relation: Sister  Code Status: Full Code Goals of Care: Advanced Directive information Advanced Directives 10/04/2017  Does Patient Have a Medical Advance Directive? Yes  Type of Advance Directive (No Data)  Does patient want to make changes to medical advance directive? No - Patient declined  Copy of [place] in Chart? -  Would patient like information on creating a medical advance directive? No - Patient declined  Pre-existing out of facility DNR order (yellow form or pink MOST form) -      Chief Complaint  Patient presents with  . New Admit To SNF    New Admission Visit    HPI: Patient is a 62 y.o. female seen today for admission to  Past Medical History:  Diagnosis Date  . Anxiety   . Arthritis   . Cancer (HCC)    cervical  . 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 Coronary artery disease   . Foot drop    left foot  . Hypertension   . Left foot drop   . 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 PONV (postoperative nausea and vomiting)   . Seizures ([place])    last one 10 years ago  . Sleep apnea   . Stroke [facility])    at age 25, left sided weakness   Past Surgical History:  Procedure Laterality Date  . BREAST SURGERY    . CERVICAL CONE BIOPSY    . COLONOSCOPY  2006   Dr. Anthony Sar: normal colonoscopy   . COLONOSCOPY WITH PROPOFOL N/A 10/06/2012   Procedure: COLONOSCOPY WITH PROPOFOL;  Surgeon: Daneil Dolin, MD;  Location: [facility];  Service: Endoscopy;  Laterality: N/A;   entered cecum @ 0905; total cecal withdrawal time=  57minutes  . DILATION AND CURETTAGE OF UTERUS    . LEG SURGERY    . LEG SURGERY     left  . OPEN REDUCTION INTERNAL FIXATION (ORIF) HAND     right hand and arm  . POLYPECTOMY N/A 10/06/2012   Procedure: POLYPECTOMY;  Surgeon: Daneil Dolin, MD;  Location: [facility];  Service: Endoscopy;  Laterality: N/A;  hepatic flexure polyp  . TONSILLECTOMY      reports that she quit smoking about 36 years ago. She quit after 5.00 years of use. she has never used smokeless tobacco. She reports that she does not drink alcohol or use drugs. Social History   Socioeconomic History  . Marital status: Single    Spouse name: Not on file  . Number of children: Not on file  . Years of education: Not on file  . Highest education level: Not on file  Social Needs  . Financial resource strain: Not on file  . Food insecurity - worry: Not on file  . Food insecurity - inability: Not on file  . Transportation needs - medical: Not on file  . Transportation needs - non-medical: Not on file  Occupational History  . Not on file  Tobacco Use  .  Smoking status: Former Smoker    Years: 5.00    Last attempt to quit: 03/25/1981    Years since quitting: 36.5  . Smokeless tobacco: Never Used  Substance and Sexual Activity  . Alcohol use: No  . Drug use: No  . Sexual activity: No    Birth control/protection: Post-menopausal  Other Topics Concern  . Not on file  Social History Narrative  . Not on file    Functional Status Survey:    Family History  Problem Relation Age of Onset  . Diabetes Mother   . Diabetes Sister   . Cancer Brother        "all over"   . Cancer Other   . Diabetes Other   . Colon cancer Unknown        unsure    Health Maintenance  Topic Date Due  . INFLUENZA VACCINE  11/01/2017 (Originally 03/10/2017)  . MAMMOGRAM  11/01/2017 (Originally 08/31/2003)  . PAP SMEAR  11/01/2017 (Originally 08/30/1974)  . TETANUS/TDAP  11/01/2017  (Originally 08/30/1972)  . Hepatitis C Screening  11/01/2017 (Originally [birth year])  . HIV Screening  11/01/2017 (Originally 08/30/1968)  . COLONOSCOPY  10/06/2022    Allergies  Allergen Reactions  . Vancomycin Hives and Itching  . Latex Rash  . Other Rash    HEART  MONITOR TAPE: severe rash, bleeding    Outpatient Encounter Medications as of 10/04/2017  Medication Sig  . atorvastatin (LIPITOR) 10 MG tablet Take 10 mg by mouth at bedtime.  . Cholecalciferol (VITAMIN D) 2000 UNITS CAPS Take 1 capsule by mouth daily.  . cyanocobalamin (,VITAMIN B-12,) 1000 MCG/ML injection Inject 1,000 mcg into the muscle every 30 (thirty) days. 15th of each month  . diclofenac sodium (VOLTAREN) 1 % GEL Apply 2-4 g topically 3 (three) times daily. to knees  . diltiazem (TIAZAC) 300 MG 24 hr capsule Take 300 mg by mouth daily.  Marland Kitchen docusate sodium (COLACE) 100 MG capsule Take 100 mg by mouth 2 (two) times daily.   Marland Kitchen doxycycline (VIBRAMYCIN) 100 MG capsule Take 1 capsule (100 mg total) by mouth 2 (two) times daily for 7 days.  . DULoxetine (CYMBALTA) 60 MG capsule Take 60 mg by mouth daily.    . fluticasone (FLONASE) 50 MCG/ACT nasal spray Place 2 sprays into both nostrils daily.  Marland Kitchen gabapentin (NEURONTIN) 300 MG capsule Take 300 mg by mouth 3 (three) times daily.  Marland Kitchen gabapentin (NEURONTIN) 600 MG tablet Take 600 mg by mouth at bedtime.  . Linaclotide (LINZESS) 290 MCG CAPS Take 1 capsule by mouth daily. 30 minutes before breakfast.  . lisinopril (PRINIVIL,ZESTRIL) 40 MG tablet Take 40 mg by mouth daily.    Marland Kitchen loratadine (CLARITIN) 10 MG tablet Take 10 mg by mouth daily.  . montelukast (SINGULAIR) 10 MG tablet Take 10 mg by mouth daily.  Marland Kitchen omeprazole (PRILOSEC) 20 MG capsule Take 20 mg by mouth daily.    Marland Kitchen oxybutynin (DITROPAN) 5 MG tablet Take 5 mg by mouth 2 (two) times daily.  Marland Kitchen senna (SENOKOT) 8.6 MG TABS Take 2 tablets by mouth daily.  Marland Kitchen topiramate (TOPAMAX) 50 MG tablet Take 50 mg by mouth 2 (two) times  daily.   . [DISCONTINUED] gabapentin (NEURONTIN) 300 MG capsule Take 300-600 mg by mouth See admin instructions. Takes 300 mg three times daily and 600 mg at bedtime.   . [DISCONTINUED] clotrimazole-betamethasone (LOTRISONE) cream Apply 1 application topically 2 (two) times daily as needed.   . [DISCONTINUED] furosemide (LASIX) 20 MG tablet Take  20 mg by mouth daily as needed for fluid.   . [DISCONTINUED] warfarin (COUMADIN) 2.5 MG tablet Take 2.5-5 mg by mouth See admin instructions. 5mg  on Mondays and Thursdays only. 2.5mg  on all other days at 4pm   No facility-administered encounter medications on file as of 10/04/2017.      Review of Systems  Vitals:   10/04/17 1309  BP: 130/75  Pulse: 78  Resp: 16  Temp: 97.8 F (36.6 C)  TempSrc: Oral   There is no height or weight on file to calculate BMI. Physical Exam  Labs reviewed: Basic Metabolic Panel: Recent Labs    09/28/17 0721 09/29/17 0535 10/04/17 0300  NA 139 140 142  K 3.8 3.9 4.0  CL 108 109 108  CO2 22 22 25   GLUCOSE 115* 112* 122*  BUN 17 15 16   CREATININE 0.57 0.56 0.57  CALCIUM 8.3* 8.2* 8.8*   Liver Function Tests: Recent Labs    09/28/17 0721  AST 14*  ALT 13*  ALKPHOS 81  BILITOT 0.7  PROT 6.5  ALBUMIN 3.2*   No results for input(s): LIPASE, AMYLASE in the last 8760 hours. No results for input(s): AMMONIA in the last 8760 hours. CBC: Recent Labs    09/30/17 0626 10/01/17 0408 10/04/17 0300  WBC 8.2 9.5 6.0  NEUTROABS 4.8 5.1 2.7  HGB 11.9* 11.3* 12.6  HCT 38.9 36.5 40.7  MCV 98.5 98.1 97.4  PLT 288 296 356   Cardiac Enzymes: No results for input(s): CKTOTAL, CKMB, CKMBINDEX, TROPONINI in the last 8760 hours. BNP: Invalid input(s): POCBNP No results found for: HGBA1C Lab Results  Component Value Date   TSH 1.284 02/01/2012   No results found for: VITAMINB12 No results found for: FOLATE No results found for: IRON, TIBC, FERRITIN  Imaging and Procedures obtained prior to SNF  admission: No results found.  Assessment/Plan There are no diagnoses linked to this encounter.   Family/ staff Communication:   Labs/tests ordered:   This encounter was created in error - please disregard.

## 2017-10-05 ENCOUNTER — Encounter: Payer: Self-pay | Admitting: Internal Medicine

## 2017-10-05 ENCOUNTER — Non-Acute Institutional Stay (SKILLED_NURSING_FACILITY): Payer: Medicare Other | Admitting: Internal Medicine

## 2017-10-05 DIAGNOSIS — I10 Essential (primary) hypertension: Secondary | ICD-10-CM | POA: Diagnosis not present

## 2017-10-05 DIAGNOSIS — L03311 Cellulitis of abdominal wall: Secondary | ICD-10-CM | POA: Diagnosis not present

## 2017-10-05 DIAGNOSIS — R569 Unspecified convulsions: Secondary | ICD-10-CM

## 2017-10-05 DIAGNOSIS — G4733 Obstructive sleep apnea (adult) (pediatric): Secondary | ICD-10-CM

## 2017-10-05 DIAGNOSIS — I4891 Unspecified atrial fibrillation: Secondary | ICD-10-CM | POA: Diagnosis not present

## 2017-10-05 NOTE — Progress Notes (Signed)
Provider:Donta Mcinroy, Lyndel Safe   Location:   [facility] Room Number: 158/P Place of Service:  SNF ([phone])  PCP: Jani Gravel, MD Patient Care Team: Jani Gravel, MD as PCP - General (Internal Medicine) Gala Romney [name], MD as Attending Physician (Gastroenterology)  Extended Emergency Contact Information Primary Emergency Contact: Taylor,Fern Address: [address] [place] [address] Johnnette Litter of [place] Phone: [phone] Relation: Sister  Code Status: Full Code Goals of Care: Advanced Directive information Advanced Directives 10/05/2017  Does Patient Have a Medical Advance Directive? Yes  Type of Advance Directive (No Data)  Does patient want to make changes to medical advance directive? No - Patient declined  Copy of [place] in Chart? -  Would patient like information on creating a medical advance directive? No - Patient declined  Pre-existing out of facility DNR order (yellow form or pink MOST form) -      Chief Complaint  Patient presents with  . New Admit To SNF    New Admission Visit    HPI: Patient is a 62 y.o. female seen today for admission to SNF for therapy after hospitalization for Abdominal Wall Cellulitis. 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 She was noticed to have Blister  In her lower abdominal area. [facility]. And they were using topical antibiotics. But the redness got worse and she started to c/o Pain so she was send to Hospital. She was treated with IV clindamycin and discharged on Po Vibramycin Her Erythema is much better. She does not have increased White Count.No Fever or chills. Her pain is better.  Her only complain is Constipation.  She has been living in [place] for many years. Was Wheelchair bound there due to recurrent  Falls.    Past Medical History:  Diagnosis Date  . Anxiety   . Arthritis   . Cancer (HCC)    cervical  . 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 Coronary artery disease   . Foot drop    left foot  . Hypertension   . Left foot drop   . 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 PONV (postoperative nausea and vomiting)   . Seizures ([place])    last one 10 years ago  . Sleep apnea   . Stroke [facility])    at age 68, left sided weakness   Past Surgical History:  Procedure Laterality Date  . BREAST SURGERY    . CERVICAL CONE BIOPSY    . COLONOSCOPY  2006   Dr. Anthony Sar: normal colonoscopy   . COLONOSCOPY WITH PROPOFOL N/A 10/06/2012   Procedure: COLONOSCOPY WITH PROPOFOL;  Surgeon: Daneil Dolin, MD;  Location: [facility];  Service: Endoscopy;  Laterality: N/A;  entered cecum @ 0905; total cecal withdrawal time=  61minutes  . DILATION AND CURETTAGE OF UTERUS    . LEG SURGERY    . LEG SURGERY     left  . OPEN REDUCTION INTERNAL FIXATION (ORIF) HAND     right hand and arm  . POLYPECTOMY N/A 10/06/2012   Procedure: POLYPECTOMY;  Surgeon: Daneil Dolin, MD;  Location: [facility];  Service: Endoscopy;  Laterality: N/A;  hepatic flexure polyp  . TONSILLECTOMY      reports that she  quit smoking about 36 years ago. She quit after 5.00 years of use. she has never used smokeless tobacco. She reports that she does not drink alcohol or use drugs. Social History   Socioeconomic History  . Marital status: Single    Spouse name: Not on file  . Number of children: Not on file  . Years of education: Not on file  . Highest education level: Not on file  Social Needs  . Financial resource strain: Not on file  . Food insecurity - worry: Not on file  . Food insecurity - inability: Not on file  . Transportation needs - medical: Not on file  . Transportation needs - non-medical: Not on file  Occupational History  . Not on file  Tobacco Use  . Smoking status: Former Smoker    Years: 5.00     Last attempt to quit: 03/25/1981    Years since quitting: 36.5  . Smokeless tobacco: Never Used  Substance and Sexual Activity  . Alcohol use: No  . Drug use: No  . Sexual activity: No    Birth control/protection: Post-menopausal  Other Topics Concern  . Not on file  Social History Narrative  . Not on file    Functional Status Survey:    Family History  Problem Relation Age of Onset  . Diabetes Mother   . Diabetes Sister   . Cancer Brother        "all over"   . Cancer Other   . Diabetes Other   . Colon cancer Unknown        unsure    Health Maintenance  Topic Date Due  . INFLUENZA VACCINE  11/01/2017 (Originally 03/10/2017)  . MAMMOGRAM  11/01/2017 (Originally 08/31/2003)  . PAP SMEAR  11/01/2017 (Originally 08/30/1974)  . TETANUS/TDAP  11/01/2017 (Originally 08/30/1972)  . Hepatitis C Screening  11/01/2017 (Originally [birth year])  . HIV Screening  11/01/2017 (Originally 08/30/1968)  . COLONOSCOPY  10/06/2022    Allergies  Allergen Reactions  . Vancomycin Hives and Itching  . Latex Rash  . Other Rash    HEART  MONITOR TAPE: severe rash, bleeding    Outpatient Encounter Medications as of 10/05/2017  Medication Sig  . atorvastatin (LIPITOR) 10 MG tablet Take 10 mg by mouth at bedtime.  . Cholecalciferol (VITAMIN D) 2000 UNITS CAPS Take 1 capsule by mouth daily.  . cyanocobalamin (,VITAMIN B-12,) 1000 MCG/ML injection Inject 1,000 mcg into the muscle every 30 (thirty) days. 15th of each month  . diclofenac sodium (VOLTAREN) 1 % GEL Apply 2-4 g topically 3 (three) times daily. to knees  . diltiazem (TIAZAC) 300 MG 24 hr capsule Take 300 mg by mouth daily.  Marland Kitchen docusate sodium (COLACE) 100 MG capsule Take 100 mg by mouth 2 (two) times daily.   Marland Kitchen doxycycline (VIBRAMYCIN) 100 MG capsule Take 1 capsule (100 mg total) by mouth 2 (two) times daily for 7 days.  . DULoxetine (CYMBALTA) 60 MG capsule Take 60 mg by mouth daily.    . fluticasone (FLONASE) 50 MCG/ACT nasal spray  Place 2 sprays into both nostrils daily.  Marland Kitchen gabapentin (NEURONTIN) 300 MG capsule Take 300 mg by mouth 3 (three) times daily.  Marland Kitchen gabapentin (NEURONTIN) 600 MG tablet Take 600 mg by mouth at bedtime.  . Linaclotide (LINZESS) 290 MCG CAPS Take 1 capsule by mouth daily. 30 minutes before breakfast.  . lisinopril (PRINIVIL,ZESTRIL) 40 MG tablet Take 40 mg by mouth daily.    Marland Kitchen loratadine (CLARITIN) 10 MG tablet Take  10 mg by mouth daily.  . montelukast (SINGULAIR) 10 MG tablet Take 10 mg by mouth daily.  Marland Kitchen omeprazole (PRILOSEC) 20 MG capsule Take 20 mg by mouth daily.    Marland Kitchen oxybutynin (DITROPAN) 5 MG tablet Take 5 mg by mouth 2 (two) times daily.  Marland Kitchen senna (SENOKOT) 8.6 MG TABS Take 2 tablets by mouth daily.  Marland Kitchen topiramate (TOPAMAX) 50 MG tablet Take 50 mg by mouth 2 (two) times daily.    No facility-administered encounter medications on file as of 10/05/2017.      Review of Systems  Review of Systems  Constitutional: Negative for activity change, appetite change, chills, diaphoresis, fatigue and fever.  HENT: Negative for mouth sores, postnasal drip, rhinorrhea, sinus pain and sore throat.   Respiratory: Negative for apnea, cough, chest tightness, shortness of breath and wheezing.   Cardiovascular: Negative for chest pain, palpitations and leg swelling.  Gastrointestinal: Negative for abdominal distention, abdominal pain,  diarrhea, nausea and vomiting.  Genitourinary: Negative for dysuria and frequency.  Musculoskeletal: Negative for arthralgias, joint swelling and myalgias.  Skin: Negative for rash.  Neurological: Negative for dizziness, syncope, weakness, light-headedness and numbness.  Psychiatric/Behavioral: Negative for behavioral problems, confusion and sleep disturbance.     Vitals:   10/05/17 1525  BP: 113/77  Pulse: 71  Resp: 20  Temp: 98.2 F (36.8 C)   There is no height or weight on file to calculate BMI. Physical Exam  Constitutional: She is oriented to person, place,  and time. She appears well-developed and well-nourished.  HENT:  Head: Normocephalic.  Mouth/Throat: Oropharynx is clear and moist.  Eyes: Pupils are equal, round, and reactive to light.  Neck: Neck supple.  Cardiovascular: Normal rate and normal heart sounds.  No murmur heard. Pulmonary/Chest: Effort normal and breath sounds normal. No respiratory distress. She has no wheezes.  Abdominal: Soft. Bowel sounds are normal. She exhibits no distension. There is no tenderness. There is no rebound.  Musculoskeletal: She exhibits no edema.  Lymphadenopathy:    She has no cervical adenopathy.  Neurological: She is alert and oriented to person, place, and time.  Has 2/5 in left UE and foot drop of LLE 5/5 in Right UE and LE  Skin: Skin is warm and dry.  Redness is decreased Significantly   Psychiatric: She has a normal mood and affect. Her behavior is normal. Thought content normal.    Labs reviewed: Basic Metabolic Panel: Recent Labs    09/28/17 0721 09/29/17 0535 10/04/17 0300  NA 139 140 142  K 3.8 3.9 4.0  CL 108 109 108  CO2 22 22 25   GLUCOSE 115* 112* 122*  BUN 17 15 16   CREATININE 0.57 0.56 0.57  CALCIUM 8.3* 8.2* 8.8*   Liver Function Tests: Recent Labs    09/28/17 0721  AST 14*  ALT 13*  ALKPHOS 81  BILITOT 0.7  PROT 6.5  ALBUMIN 3.2*   No results for input(s): LIPASE, AMYLASE in the last 8760 hours. No results for input(s): AMMONIA in the last 8760 hours. CBC: Recent Labs    09/30/17 0626 10/01/17 0408 10/04/17 0300  WBC 8.2 9.5 6.0  NEUTROABS 4.8 5.1 2.7  HGB 11.9* 11.3* 12.6  HCT 38.9 36.5 40.7  MCV 98.5 98.1 97.4  PLT 288 296 356   Cardiac Enzymes: No results for input(s): CKTOTAL, CKMB, CKMBINDEX, TROPONINI in the last 8760 hours. BNP: Invalid input(s): POCBNP No results found for: HGBA1C Lab Results  Component Value Date   TSH 1.284 02/01/2012  No results found for: VITAMINB12 No results found for: FOLATE No results found for: IRON,  TIBC, FERRITIN  Imaging and Procedures obtained prior to SNF admission: No results found.  Assessment/Plan Cellulitis of abdominal Wall Patient is on Vibramycin for 7 days. Redness almost resolved. Patient wasn't some topical Antibiotics Will try some Neomycin  Atrial Fibrillation On Diltiazem and Coumadin Dose adjusted for INR  Sleep Apnea Patient refusing to use one in Facility  H/O Seizures On Topiramate and Gabapentin H/o Depression On Cymbalta Overactive Bladder On oxybutynin Constipation Patient wants to try MOM.  Family/ staff Communication:   Labs/tests ordered: Total time spent in this patient care encounter was 45_ minutes; greater than 50% of the visit spent counseling patient, reviewing records , Labs and coordinating care for problems addressed at this encounter.

## 2017-10-07 ENCOUNTER — Ambulatory Visit: Payer: Medicare Other | Admitting: Orthopaedic Surgery

## 2017-10-11 ENCOUNTER — Other Ambulatory Visit (HOSPITAL_COMMUNITY)
Admission: RE | Admit: 2017-10-11 | Discharge: 2017-10-11 | Disposition: A | Discharge status: Home / Self Care | Payer: Medicare Other | Source: Skilled Nursing Facility | Attending: Internal Medicine | Admitting: Internal Medicine

## 2017-10-11 DIAGNOSIS — I10 Essential (primary) hypertension: Secondary | ICD-10-CM | POA: Insufficient documentation

## 2017-10-11 LAB — BASIC METABOLIC PANEL
ANION GAP: 9 (ref 5–15)
BUN: 20 mg/dL (ref 6–20)
CALCIUM: 9 mg/dL (ref 8.9–10.3)
CO2: 24 mmol/L (ref 22–32)
Chloride: 107 mmol/L (ref 101–111)
Creatinine, Ser: 0.65 mg/dL (ref 0.44–1.00)
Glucose, Bld: 95 mg/dL (ref 65–99)
Potassium: 4 mmol/L (ref 3.5–5.1)
Sodium: 140 mmol/L (ref 135–145)

## 2017-10-11 LAB — CBC WITH DIFFERENTIAL/PLATELET
BASOS ABS: 0.1 10*3/uL (ref 0.0–0.1)
Basophils Relative: 1 %
EOS PCT: 3 %
Eosinophils Absolute: 0.2 10*3/uL (ref 0.0–0.7)
HEMATOCRIT: 39.1 % (ref 36.0–46.0)
Hemoglobin: 12.2 g/dL (ref 12.0–15.0)
LYMPHS PCT: 38 %
Lymphs Abs: 2.7 10*3/uL (ref 0.7–4.0)
MCH: 30.4 pg (ref 26.0–34.0)
MCHC: 31.2 g/dL (ref 30.0–36.0)
MCV: 97.5 fL (ref 78.0–100.0)
MONO ABS: 1.2 10*3/uL — AB (ref 0.1–1.0)
Monocytes Relative: 17 %
NEUTROS ABS: 2.9 10*3/uL (ref 1.7–7.7)
Neutrophils Relative %: 41 %
PLATELETS: 400 10*3/uL (ref 150–400)
RBC: 4.01 MIL/uL (ref 3.87–5.11)
RDW: 15.1 % (ref 11.5–15.5)
WBC: 7.1 10*3/uL (ref 4.0–10.5)

## 2017-10-11 LAB — PROTIME-INR
INR: 1.94
Prothrombin Time: 22 seconds — ABNORMAL HIGH (ref 11.4–15.2)

## 2017-10-12 ENCOUNTER — Encounter (HOSPITAL_COMMUNITY)
Admission: RE | Admit: 2017-10-12 | Discharge: 2017-10-12 | Disposition: A | Discharge status: Home / Self Care | Payer: Medicare Other | Source: Skilled Nursing Facility | Attending: Internal Medicine | Admitting: Internal Medicine

## 2017-10-12 DIAGNOSIS — G4733 Obstructive sleep apnea (adult) (pediatric): Secondary | ICD-10-CM | POA: Insufficient documentation

## 2017-10-12 DIAGNOSIS — I10 Essential (primary) hypertension: Secondary | ICD-10-CM | POA: Insufficient documentation

## 2017-10-12 DIAGNOSIS — A4902 Methicillin resistant Staphylococcus aureus infection, unspecified site: Secondary | ICD-10-CM | POA: Insufficient documentation

## 2017-10-12 DIAGNOSIS — G40909 Epilepsy, unspecified, not intractable, without status epilepticus: Secondary | ICD-10-CM | POA: Insufficient documentation

## 2017-10-12 DIAGNOSIS — F411 Generalized anxiety disorder: Secondary | ICD-10-CM | POA: Insufficient documentation

## 2017-10-18 ENCOUNTER — Encounter (HOSPITAL_COMMUNITY)
Admission: RE | Admit: 2017-10-18 | Discharge: 2017-10-18 | Disposition: A | Discharge status: Home / Self Care | Payer: Medicare Other | Source: Skilled Nursing Facility | Attending: *Deleted | Admitting: *Deleted

## 2017-10-18 LAB — PROTIME-INR
INR: 1.95
PROTHROMBIN TIME: 22.1 s — AB (ref 11.4–15.2)

## 2017-10-19 ENCOUNTER — Encounter: Payer: Self-pay | Admitting: Internal Medicine

## 2017-10-19 NOTE — Progress Notes (Signed)
Location:    [facility] Room Number: 158/P Place of Service:  SNF (31)  Provider: Granville Lewis  PCP: Jani Gravel, MD Patient Care Team: Jani Gravel, MD as PCP - General (Internal Medicine) Gala Romney [name], MD as Attending Physician (Gastroenterology)  Extended Emergency Contact Information Primary Emergency Contact: Taylor,Fern Address: [address] [place] [address] Johnnette Litter of [address] Phone: [phone] Relation: Sister  Code Status: Full Code Goals of care:  Advanced Directive information Advanced Directives 10/19/2017  Does Patient Have a Medical Advance Directive? Yes  Type of Advance Directive (No Data)  Does patient want to make changes to medical advance directive? No - Patient declined  Copy of [place] in Chart? -  Would patient like information on creating a medical advance directive? No - Patient declined  Pre-existing out of facility DNR order (yellow form or pink MOST form) -     Allergies  Allergen Reactions  . Vancomycin Hives and Itching  . Latex Rash  . Other Rash    HEART  MONITOR TAPE: severe rash, bleeding    Chief Complaint  Patient presents with  . Discharge Note    Discharge Visit    HPI:  62 y.o. female      Past Medical History:  Diagnosis Date  . Anxiety   . Arthritis   . Cancer (HCC)    cervical  . 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 Coronary artery disease   . Foot drop    left foot  . Hypertension   . Left foot drop   . 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 PONV (postoperative nausea and vomiting)   . Seizures ([place])    last one 10 years ago  . Sleep apnea   . Stroke [facility])    at age 28, left sided weakness    Past Surgical History:  Procedure Laterality Date  . BREAST SURGERY    . CERVICAL CONE BIOPSY    . COLONOSCOPY  2006   Dr. Anthony Sar: normal colonoscopy   . COLONOSCOPY WITH PROPOFOL N/A 10/06/2012   Procedure:  COLONOSCOPY WITH PROPOFOL;  Surgeon: Daneil Dolin, MD;  Location: [facility];  Service: Endoscopy;  Laterality: N/A;  entered cecum @ 0905; total cecal withdrawal time=  59minutes  . DILATION AND CURETTAGE OF UTERUS    . LEG SURGERY    . LEG SURGERY     left  . OPEN REDUCTION INTERNAL FIXATION (ORIF) HAND     right hand and arm  . POLYPECTOMY N/A 10/06/2012   Procedure: POLYPECTOMY;  Surgeon: Daneil Dolin, MD;  Location: [facility];  Service: Endoscopy;  Laterality: N/A;  hepatic flexure polyp  . TONSILLECTOMY        reports that she quit smoking about 36 years ago. She quit after 5.00 years of use. she has never used smokeless tobacco. She reports that she does not drink alcohol or use drugs. Social History   Socioeconomic History  . Marital status: Single    Spouse name: Not on file  . Number of children: Not on file  . Years of education: Not on file  . Highest education level: Not on file  Social Needs  . Financial resource strain: Not on file  . Food insecurity - worry: Not on file  . Food insecurity - inability: Not on file  .  Transportation needs - medical: Not on file  . Transportation needs - non-medical: Not on file  Occupational History  . Not on file  Tobacco Use  . Smoking status: Former Smoker    Years: 5.00    Last attempt to quit: 03/25/1981    Years since quitting: 36.5  . Smokeless tobacco: Never Used  Substance and Sexual Activity  . Alcohol use: No  . Drug use: No  . Sexual activity: No    Birth control/protection: Post-menopausal  Other Topics Concern  . Not on file  Social History Narrative  . Not on file   Functional Status Survey:    Allergies  Allergen Reactions  . Vancomycin Hives and Itching  . Latex Rash  . Other Rash    HEART  MONITOR TAPE: severe rash, bleeding    Pertinent  Health Maintenance Due  Topic Date Due  . INFLUENZA VACCINE  11/01/2017 (Originally 03/10/2017)  . MAMMOGRAM  11/01/2017 (Originally 08/31/2003)  . PAP SMEAR   11/01/2017 (Originally 08/30/1974)  . COLONOSCOPY  10/06/2022    Medications: Outpatient Encounter Medications as of 10/19/2017  Medication Sig  . atorvastatin (LIPITOR) 10 MG tablet Take 10 mg by mouth at bedtime.  . Cholecalciferol (VITAMIN D) 2000 UNITS CAPS Take 1 capsule by mouth daily.  . cyanocobalamin (,VITAMIN B-12,) 1000 MCG/ML injection Inject 1,000 mcg into the muscle every 30 (thirty) days. 15th of each month  . diclofenac sodium (VOLTAREN) 1 % GEL Apply 2-4 g topically 3 (three) times daily. to knees  . diltiazem (TIAZAC) 300 MG 24 hr capsule Take 300 mg by mouth daily.  Marland Kitchen docusate sodium (COLACE) 100 MG capsule Take 100 mg by mouth 2 (two) times daily.   . DULoxetine (CYMBALTA) 60 MG capsule Take 60 mg by mouth daily.    . fluticasone (FLONASE) 50 MCG/ACT nasal spray Place 2 sprays into both nostrils daily.  Marland Kitchen gabapentin (NEURONTIN) 300 MG capsule Take 300 mg by mouth 3 (three) times daily.  Marland Kitchen gabapentin (NEURONTIN) 600 MG tablet Take 600 mg by mouth at bedtime.  . Linaclotide (LINZESS) 290 MCG CAPS Take 1 capsule by mouth daily. 30 minutes before breakfast.  . lisinopril (PRINIVIL,ZESTRIL) 40 MG tablet Take 40 mg by mouth daily.    Marland Kitchen loratadine (CLARITIN) 10 MG tablet Take 10 mg by mouth daily.  . montelukast (SINGULAIR) 10 MG tablet Take 10 mg by mouth daily.  Marland Kitchen omeprazole (PRILOSEC) 20 MG capsule Take 20 mg by mouth daily.    Marland Kitchen oxybutynin (DITROPAN) 5 MG tablet Take 5 mg by mouth 2 (two) times daily.  Marland Kitchen senna (SENOKOT) 8.6 MG TABS Take 2 tablets by mouth daily.  Marland Kitchen topiramate (TOPAMAX) 50 MG tablet Take 50 mg by mouth 2 (two) times daily.   Marland Kitchen warfarin (COUMADIN) 1 MG tablet Give 0.5 mg by mouth  once a evening on Mon., and Thurs., with 5 mg to = 5.5 mg  . warfarin (COUMADIN) 2.5 MG tablet Take 2.5 mg by mouth every evening. On Sun., Tues., Wed., Fri., Sat.  . warfarin (COUMADIN) 5 MG tablet Give 5 mg by mouth along with 0.5 mg on Mon., and Thurs., once a evening to = 5.5 mg     No facility-administered encounter medications on file as of 10/19/2017.      Review of Systems  Vitals:   10/19/17 1420  BP: 106/72  Pulse: 93  Resp: 20  Temp: 97.6 F (36.4 C)  TempSrc: Oral  SpO2: 95%   There is no  height or weight on file to calculate BMI. Physical Exam  Labs reviewed: Basic Metabolic Panel: Recent Labs    09/29/17 0535 10/04/17 0300 10/11/17 0800  NA 140 142 140  K 3.9 4.0 4.0  CL 109 108 107  CO2 22 25 24   GLUCOSE 112* 122* 95  BUN 15 16 20   CREATININE 0.56 0.57 0.65  CALCIUM 8.2* 8.8* 9.0   Liver Function Tests: Recent Labs    09/28/17 0721  AST 14*  ALT 13*  ALKPHOS 81  BILITOT 0.7  PROT 6.5  ALBUMIN 3.2*   No results for input(s): LIPASE, AMYLASE in the last 8760 hours. No results for input(s): AMMONIA in the last 8760 hours. CBC: Recent Labs    10/01/17 0408 10/04/17 0300 10/11/17 0800  WBC 9.5 6.0 7.1  NEUTROABS 5.1 2.7 2.9  HGB 11.3* 12.6 12.2  HCT 36.5 40.7 39.1  MCV 98.1 97.4 97.5  PLT 296 356 400   Cardiac Enzymes: No results for input(s): CKTOTAL, CKMB, CKMBINDEX, TROPONINI in the last 8760 hours. BNP: Invalid input(s): POCBNP CBG: No results for input(s): GLUCAP in the last 8760 hours.  Procedures and Imaging Studies During Stay: Dg Hip Unilat W Or Wo Pelvis 2-3 Views Left  Result Date: 09/27/2017 CLINICAL DATA:  Left posterior hip pain x1 week without known injury. EXAM: DG HIP (WITH OR WITHOUT PELVIS) 2-3V LEFT COMPARISON:  None. FINDINGS: AP view of the pelvis as well as AP and frog-leg views of the left hip are provided. No acute displaced fracture or joint dislocation is seen. There is lower lumbar facet arthropathy at L4-5 and L5-S1. Bony pelvis appears intact. No suspicious osseous lesions. No soft tissue mass or mineralization. IMPRESSION: No acute osseous abnormality of the bony pelvis and either hip. Lower lumbar degenerative facet arthropathy. Electronically Signed   By: Ashley Royalty M.D.   On:  09/27/2017 22:52    Assessment/Plan:    Future labs/tests needed:     This encounter was created in error - please disregard.

## 2017-10-20 ENCOUNTER — Non-Acute Institutional Stay (SKILLED_NURSING_FACILITY): Payer: Medicare Other | Admitting: Internal Medicine

## 2017-10-20 ENCOUNTER — Encounter: Payer: Self-pay | Admitting: Internal Medicine

## 2017-10-20 DIAGNOSIS — I4891 Unspecified atrial fibrillation: Secondary | ICD-10-CM

## 2017-10-20 DIAGNOSIS — R569 Unspecified convulsions: Secondary | ICD-10-CM | POA: Diagnosis not present

## 2017-10-20 DIAGNOSIS — K5900 Constipation, unspecified: Secondary | ICD-10-CM | POA: Diagnosis not present

## 2017-10-20 DIAGNOSIS — L03311 Cellulitis of abdominal wall: Secondary | ICD-10-CM | POA: Diagnosis not present

## 2017-10-20 DIAGNOSIS — I10 Essential (primary) hypertension: Secondary | ICD-10-CM | POA: Diagnosis not present

## 2017-10-20 NOTE — Progress Notes (Signed)
Location:   [facility] Room Number: 158/P Place of Service:  SNF (31)  Provider: Alene Mires. Oscar La  PCP: Jani Gravel, MD Patient Care Team: Jani Gravel, MD as PCP - General (Internal Medicine) Gala Romney [name], MD as Attending Physician (Gastroenterology)  Extended Emergency Contact Information Primary Emergency Contact: Taylor,Fern Address: [address] [place] [address] Johnnette Litter of [place] Phone: [phone] Relation: Sister  Code Status: Full Code Goals of care:  Advanced Directive information Advanced Directives 10/20/2017  Does Patient Have a Medical Advance Directive? Yes  Type of Advance Directive (No Data)  Does patient want to make changes to medical advance directive? No - Patient declined  Copy of [place] in Chart? -  Would patient like information on creating a medical advance directive? No - Patient declined  Pre-existing out of facility DNR order (yellow form or pink MOST form) -     Allergies  Allergen Reactions  . Vancomycin Hives and Itching  . Latex Rash  . Other Rash    HEART  MONITOR TAPE: severe rash, bleeding    Chief Complaint  Patient presents with  . Discharge Note    Discharge Visit    HPI:  62 y.o. female seen today for discharge from facility later this week.  Patient was here for rehab after hospitalization for abdominal wall cellulitis which is significantly improved she is completed her course of Vibramycin.  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  Her other medical issues include atrial fibrillation which is rate controlled on diltiazem she is on Coumadin for anticoagulation INR earlier this week was 1.95 and update INR has been ordered for next week.  She also has a history of pulmonary embolism in the past.  In regards to seizure  disorder she continues on Topamax as well as Neurontin and this has been stable during her stay here.  She also has a history  constipation which appears to be better on Linzess as well as Colace  .  Regards to hypertension she is on diltiazem as well as lisinopril-this appears stable --recent blood pressures 125/80-118/67- 126/79.          Past Medical History:  Diagnosis Date  . Anxiety   . Arthritis   . Cancer (HCC)    cervical  . 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 Coronary artery disease   . Foot drop    left foot  . Hypertension   . Left foot drop   . 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 PONV (postoperative nausea and vomiting)   . Seizures ([place])    last one 10 years ago  . Sleep apnea   . Stroke [facility])    at age 11, left sided weakness    Past Surgical History:  Procedure Laterality Date  . BREAST SURGERY    . CERVICAL CONE BIOPSY    . COLONOSCOPY  2006   Dr. Anthony Sar: normal colonoscopy   . COLONOSCOPY WITH PROPOFOL N/A 10/06/2012   Procedure: COLONOSCOPY WITH PROPOFOL;  Surgeon: Daneil Dolin, MD;  Location: [facility];  Service: Endoscopy;  Laterality: N/A;  entered cecum @ 0905; total cecal withdrawal time=  32minutes  . DILATION AND CURETTAGE OF UTERUS    .  LEG SURGERY    . LEG SURGERY     left  . OPEN REDUCTION INTERNAL FIXATION (ORIF) HAND     right hand and arm  . POLYPECTOMY N/A 10/06/2012   Procedure: POLYPECTOMY;  Surgeon: Daneil Dolin, MD;  Location: [facility];  Service: Endoscopy;  Laterality: N/A;  hepatic flexure polyp  . TONSILLECTOMY        reports that she quit smoking about 36 years ago. She quit after 5.00 years of use. she has never used smokeless tobacco. She reports that she does not drink alcohol or use drugs. Social History   Socioeconomic History  . Marital status: Single    Spouse name: Not on file  . Number of children: Not on file  . Years of education: Not on file  . Highest education level: Not on  file  Social Needs  . Financial resource strain: Not on file  . Food insecurity - worry: Not on file  . Food insecurity - inability: Not on file  . Transportation needs - medical: Not on file  . Transportation needs - non-medical: Not on file  Occupational History  . Not on file  Tobacco Use  . Smoking status: Former Smoker    Years: 5.00    Last attempt to quit: 03/25/1981    Years since quitting: 36.5  . Smokeless tobacco: Never Used  Substance and Sexual Activity  . Alcohol use: No  . Drug use: No  . Sexual activity: No    Birth control/protection: Post-menopausal  Other Topics Concern  . Not on file  Social History Narrative  . Not on file   Functional Status Survey:    Allergies  Allergen Reactions  . Vancomycin Hives and Itching  . Latex Rash  . Other Rash    HEART  MONITOR TAPE: severe rash, bleeding    Pertinent  Health Maintenance Due  Topic Date Due  . INFLUENZA VACCINE  11/01/2017 (Originally 03/10/2017)  . MAMMOGRAM  11/01/2017 (Originally 08/31/2003)  . PAP SMEAR  11/01/2017 (Originally 08/30/1974)  . COLONOSCOPY  10/06/2022    Medications: Outpatient Encounter Medications as of 10/20/2017  Medication Sig  . atorvastatin (LIPITOR) 10 MG tablet Take 10 mg by mouth at bedtime.  . Cholecalciferol (VITAMIN D) 2000 UNITS CAPS Take 1 capsule by mouth daily.  . cyanocobalamin (,VITAMIN B-12,) 1000 MCG/ML injection Inject 1,000 mcg into the muscle every 30 (thirty) days. 15th of each month  . diclofenac sodium (VOLTAREN) 1 % GEL Apply 2-4 g topically 3 (three) times daily. to knees  . diltiazem (TIAZAC) 300 MG 24 hr capsule Take 300 mg by mouth daily.  Marland Kitchen docusate sodium (COLACE) 100 MG capsule Take 100 mg by mouth 2 (two) times daily.   . DULoxetine (CYMBALTA) 60 MG capsule Take 60 mg by mouth daily.    . fluticasone (FLONASE) 50 MCG/ACT nasal spray Place 2 sprays into both nostrils daily.  Marland Kitchen gabapentin (NEURONTIN) 300 MG capsule Take 300 mg by mouth 3 (three)  times daily.  Marland Kitchen gabapentin (NEURONTIN) 600 MG tablet Take 600 mg by mouth at bedtime.  . Linaclotide (LINZESS) 290 MCG CAPS Take 1 capsule by mouth daily. 30 minutes before breakfast.  . lisinopril (PRINIVIL,ZESTRIL) 40 MG tablet Take 40 mg by mouth daily.    Marland Kitchen loratadine (CLARITIN) 10 MG tablet Take 10 mg by mouth daily.  . montelukast (SINGULAIR) 10 MG tablet Take 10 mg by mouth daily.  Marland Kitchen omeprazole (PRILOSEC) 20 MG capsule Take 20 mg by mouth  daily.    . oxybutynin (DITROPAN) 5 MG tablet Take 5 mg by mouth 2 (two) times daily.  Marland Kitchen senna (SENOKOT) 8.6 MG TABS Take 2 tablets by mouth daily.  Marland Kitchen topiramate (TOPAMAX) 50 MG tablet Take 50 mg by mouth 2 (two) times daily.   Marland Kitchen warfarin (COUMADIN) 1 MG tablet Give 0.5 mg by mouth  once a evening on Mon., and Thurs., with 5 mg to = 5.5 mg  . warfarin (COUMADIN) 2.5 MG tablet Take 2.5 mg by mouth every evening. On Sun., Tues., Wed., Fri., Sat.  . warfarin (COUMADIN) 5 MG tablet Give 5 mg by mouth along with 0.5 mg on Mon., and Thurs., once a evening to = 5.5 mg   No facility-administered encounter medications on file as of 10/20/2017.      Review of Systems   General she is not complaining any fever chills says she feels well.  Skin is not complain of rashes or itching cellulitis abdominal area appears to be resolved.  Head ears eyes nose mouth and throat is not complaining of any sore throat or visual changes.  Respiratory denies shortness of breath or cough.  Cardiac denies chest pain does not really have significant lower extremity edema.  GI is not complaining of any abdominal discomfort nausea vomiting diarrhea or constipation.  GU does have a history of urinary frequency on oxybutynin does not complain of dysuria today.  Muscular skeletal has somewhat chronic back and leg discomfort she says has been chronic and pain medication does help   neurologic is not complaining of dizziness headache or numbness does have a history of CVA with  left-sided weakness at baseline with a left foot drop.  Psych does not complain of overt anxiety or depression she does have some history of depression and anxiety appears stable on Cymbalta  Vitals:   10/20/17 1048  BP: 120/78  Pulse: 92  Resp: 20  Temp: (!) 97.5 F (36.4 C)  TempSrc: Oral  SpO2: 98%    Physical Exam   In general this is a pleasant middle-age female no distress sitting comfortably in her wheelchair.  Her skin is warm and dry cellulitis site on abdomen appears to be resolved.  Eyes she does have somewhat large pupils these appear to be reactive visual acuity is intact sclera and conjunctive are clear.  Oropharynx is clear mucous membranes moist.  Chest is clear to auscultation there is no labored breathing.  Heart is irregular irregular rate and rhythm without murmur gallop or rub she does not really have significant lower extremity edema.  Her abdomen is soft nontender with positive bowel sounds it is somewhat obese.  Musculoskeletal does have left-sided weakness is able to move her left r extremities but has significant weakness compared to the right she does have a brace applied to her left lower extremity with a history of left foot drop  Neurologic as noted above cranial nerves appear to be intact her speech is clear.  Psych she appears largely alert and oriented pleasant and appropriate [place] in the assessment and plan scanner make sure I have a chance so he sometimes it would just pop up there might  You are here apply would be swearing endocrine finger on  Labs reviewed: Basic Metabolic Panel: Recent Labs    09/29/17 0535 10/04/17 0300 10/11/17 0800  NA 140 142 140  K 3.9 4.0 4.0  CL 109 108 107  CO2 22 25 24   GLUCOSE 112* 122* 95  BUN 15 16  20  CREATININE 0.56 0.57 0.65  CALCIUM 8.2* 8.8* 9.0   Liver Function Tests: Recent Labs    09/28/17 0721  AST 14*  ALT 13*  ALKPHOS 81  BILITOT 0.7  PROT 6.5  ALBUMIN 3.2*   No results  for input(s): LIPASE, AMYLASE in the last 8760 hours. No results for input(s): AMMONIA in the last 8760 hours. CBC: Recent Labs    10/01/17 0408 10/04/17 0300 10/11/17 0800  WBC 9.5 6.0 7.1  NEUTROABS 5.1 2.7 2.9  HGB 11.3* 12.6 12.2  HCT 36.5 40.7 39.1  MCV 98.1 97.4 97.5  PLT 296 356 400   Cardiac Enzymes: No results for input(s): CKTOTAL, CKMB, CKMBINDEX, TROPONINI in the last 8760 hours. BNP: Invalid input(s): POCBNP CBG: No results for input(s): GLUCAP in the last 8760 hours.  Procedures and Imaging Studies During Stay: Dg Hip Unilat W Or Wo Pelvis 2-3 Views Left  Result Date: 09/27/2017 CLINICAL DATA:  Left posterior hip pain x1 week without known injury. EXAM: DG HIP (WITH OR WITHOUT PELVIS) 2-3V LEFT COMPARISON:  None. FINDINGS: AP view of the pelvis as well as AP and frog-leg views of the left hip are provided. No acute displaced fracture or joint dislocation is seen. There is lower lumbar facet arthropathy at L4-5 and L5-S1. Bony pelvis appears intact. No suspicious osseous lesions. No soft tissue mass or mineralization. IMPRESSION: No acute osseous abnormality of the bony pelvis and either hip. Lower lumbar degenerative facet arthropathy. Electronically Signed   By: Ashley Royalty M.D.   On: 09/27/2017 22:52    Assessment/Plan:    #1 history of abdominal wall cellulitis is has essentially resolved she is completed her antibiotic at this point monitor will need follow-up by primary care provider.  2.  Atrial fibrillation this appears rate controlled on diltiazem-INR is 1.95 on lab done earlier this week there was a slight adjustment made in her Coumadin dose update INR is pending for next week --- this will have to be drawn by home health and primary care provider notified of results.  3.  History of pulmonary embolism-of note again she continues on Coumadin INR is being monitored.  4.  History of CVA with left-sided weakness she appears to be doing well with supportive  care again will be going back to assisted living which apparently she has done quite well with.  5.-History of hypertension as noted above this appears stable on diltiazem and lisinopril.  6.  History of depression she is on Cymbalta this has not really been an issue during her stay here.  7.  History of overactive bladder she is on oxybutynin.  8.  History of allergic rhinitis she continues on Claritin as well as Singulair this ihasbeen stable as well during her stay here.  9.  History of constipation which is apparently improved on the Linzess as well as Colace.  10.  History of sleep apnea she has been on CPAP but apparently has refused this during her stay here-.  Apparently she has used it at the assisted living facility.  11.  History of coronary artery disease this appears to have been asymptomatic during her stay here she is on anticoagulation as well and ACE inhibitor-since her stay here was quite short will defer to primary care provider for follow-up of her lipids.  12.  History of seizure disorder again.  She is on Neurontin as well as Topamax   Again she will be going back to her assisted living facility is looking  forward to this will need expedient follow-up especially in regards to her INR and apparently primary care provider will be following up on this next Monday  [id number] note greater than 30 minutes spent  on this discharge summary-greater than 50% of time spent coordinating  Plan of the unsteady 3 times a week district once a week. His weight care for numerous diagnoses--

## 2017-10-25 ENCOUNTER — Encounter (HOSPITAL_COMMUNITY)
Admission: RE | Admit: 2017-10-25 | Discharge: 2017-10-25 | Disposition: A | Discharge status: Home / Self Care | Payer: Medicare Other | Source: Skilled Nursing Facility | Attending: *Deleted | Admitting: *Deleted

## 2017-11-05 ENCOUNTER — Telehealth: Payer: Self-pay

## 2017-11-05 NOTE — Telephone Encounter (Signed)
Pt is on the schedule for a nurse visit to schedule tcs. Pt is on coumadin and last tcs in 2014 was done in the OR. Pt will need an office visit. Please schedule ov and cancel nurse visit.

## 2017-11-08 ENCOUNTER — Ambulatory Visit (INDEPENDENT_AMBULATORY_CARE_PROVIDER_SITE_OTHER): Payer: Medicare Other | Admitting: *Deleted

## 2017-11-08 DIAGNOSIS — I4891 Unspecified atrial fibrillation: Secondary | ICD-10-CM

## 2017-11-08 DIAGNOSIS — I4892 Unspecified atrial flutter: Secondary | ICD-10-CM | POA: Diagnosis not present

## 2017-11-08 DIAGNOSIS — Z5181 Encounter for therapeutic drug level monitoring: Secondary | ICD-10-CM | POA: Diagnosis not present

## 2017-11-08 DIAGNOSIS — I2699 Other pulmonary embolism without acute cor pulmonale: Secondary | ICD-10-CM | POA: Diagnosis not present

## 2017-11-08 LAB — POCT INR: INR: 2.6

## 2017-11-08 NOTE — Patient Instructions (Signed)
Take coumadin 1 1/2 tablets tonight (3.75mg ) then resume 1 tablet  (2.5mg ) daily except 2 tablets (5mg )  on Mondays and Thursdays Recheck in 6 weeks

## 2017-11-11 ENCOUNTER — Ambulatory Visit: Payer: Medicare Other

## 2017-12-08 IMAGING — MR MR KNEE*R* W/O CM
4 of 5 series · 16 of 40 positions shown · non-contrast
Comparison: Radiographs 04/20/2017.

CLINICAL DATA: Right knee pain for more than 5 years. No known
injury for reported relevant prior surgery.

EXAM:
MRI OF THE RIGHT KNEE WITHOUT CONTRAST
TECHNIQUE: Multiplanar, multisequence MR imaging of the knee was performed. No
intravenous contrast was administered.

[Series 3: pdfs axial blade · axial · 3.0mm · 0.60mm/px · z∈[-73,-1]mm · 3 of 30 slices shown]
[im 5/30]
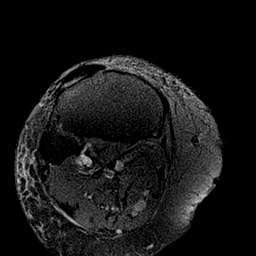
[im 17/30]
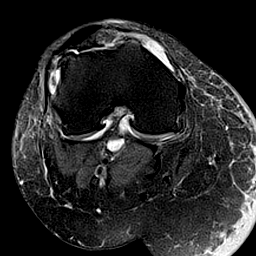
[im 25/30]
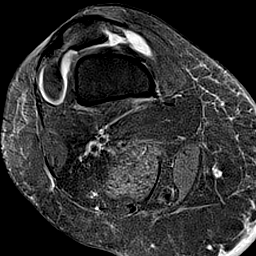

[Series 4: T1 · coronal · 3.0mm · 0.22mm/px · 7 of 30 slices shown]
[im 1/30]
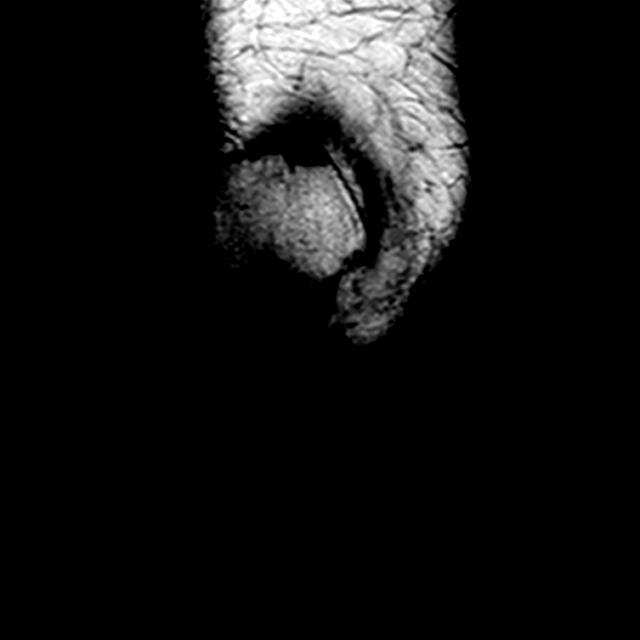
[im 5/30]
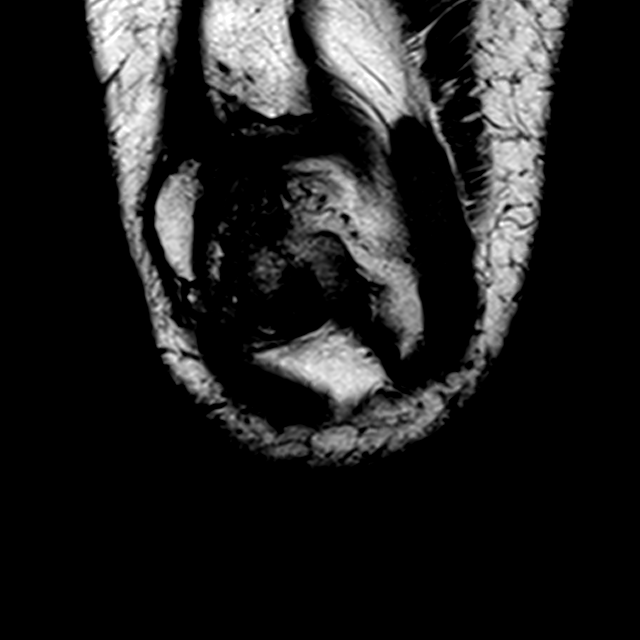
[im 9/30]
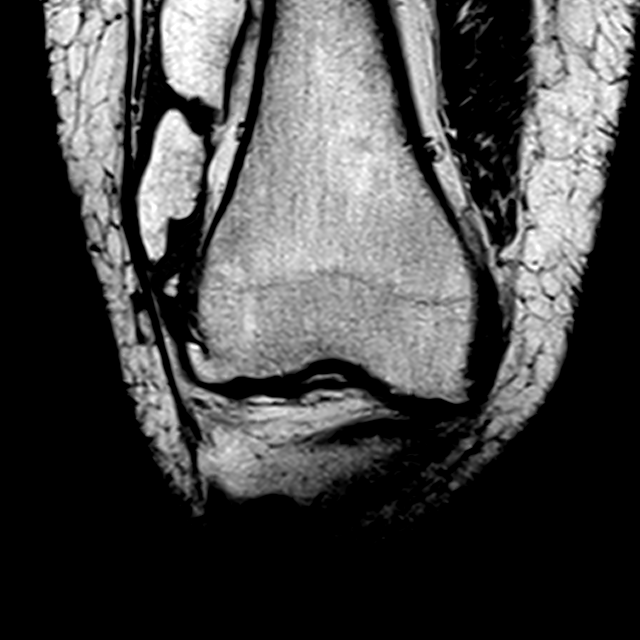
[im 13/30]
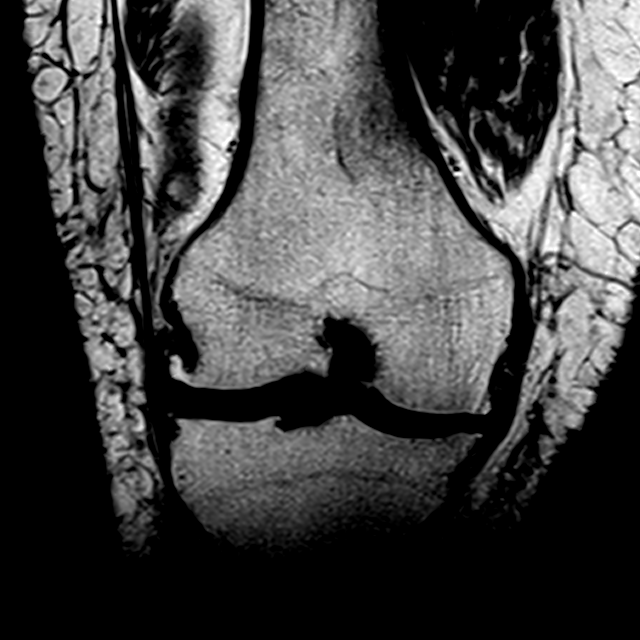
[im 17/30]
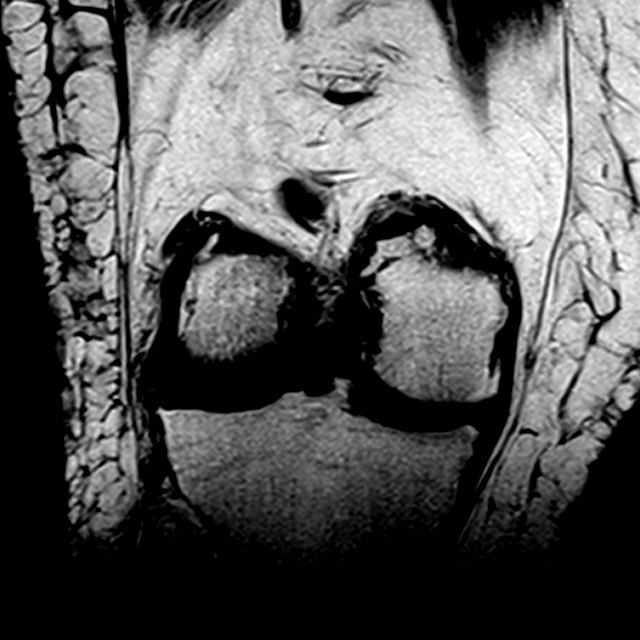
[im 21/30]
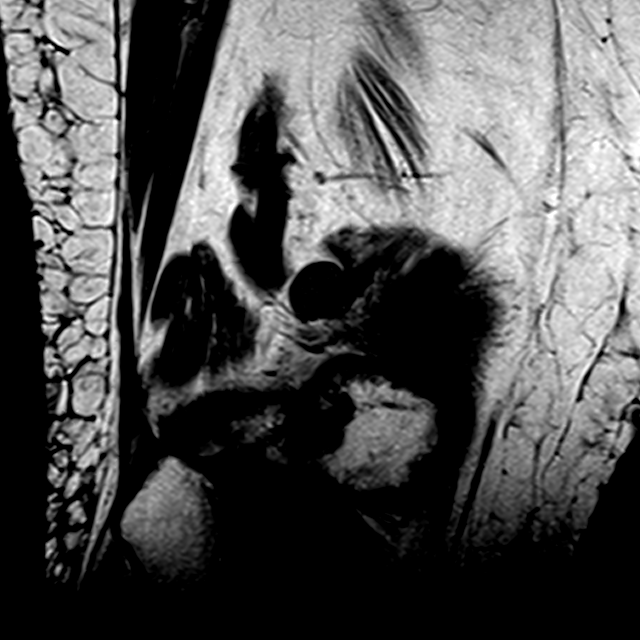
[im 25/30]
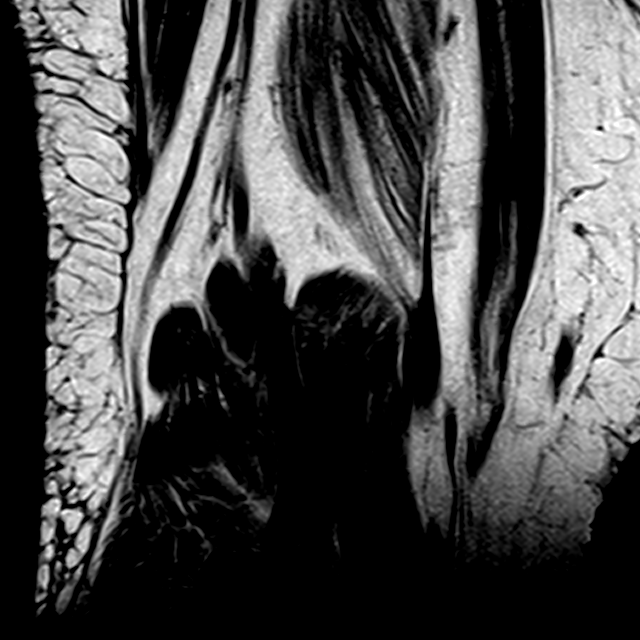

[Series 5: pdfs sag · sagittal · 3.0mm · 0.28mm/px · 3 of 32 slices shown]
[im 4/32]
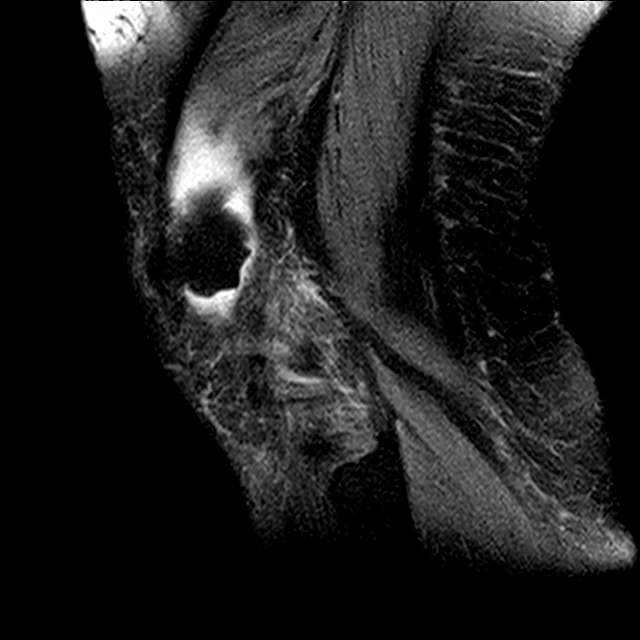
[im 16/32]
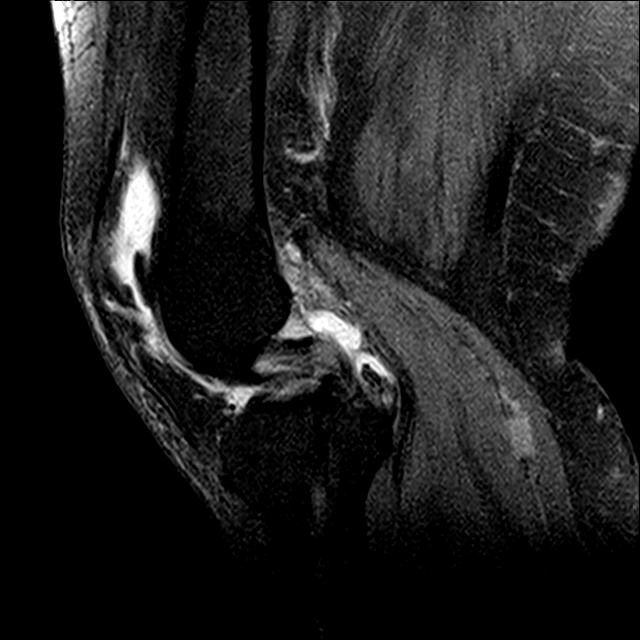
[im 28/32]
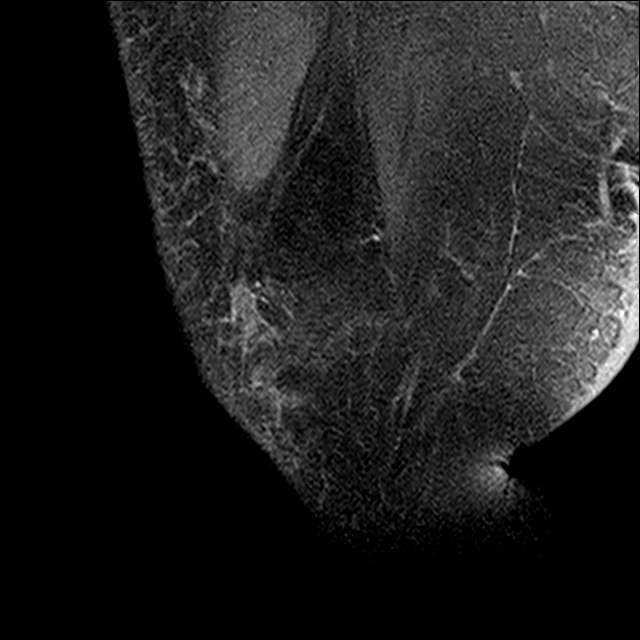

[Series 6: t2fs cor repeat · coronal · 3.0mm · 0.26mm/px · 3 of 24 slices shown]
[im 4/24]
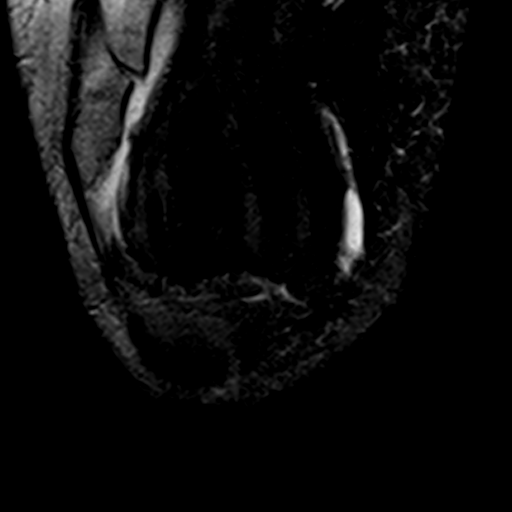
[im 12/24]
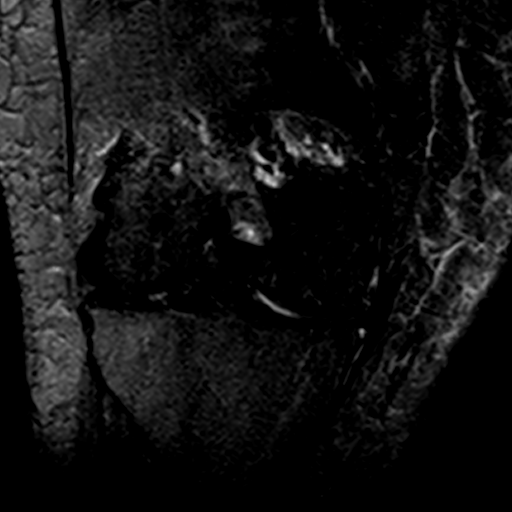
[im 20/24]
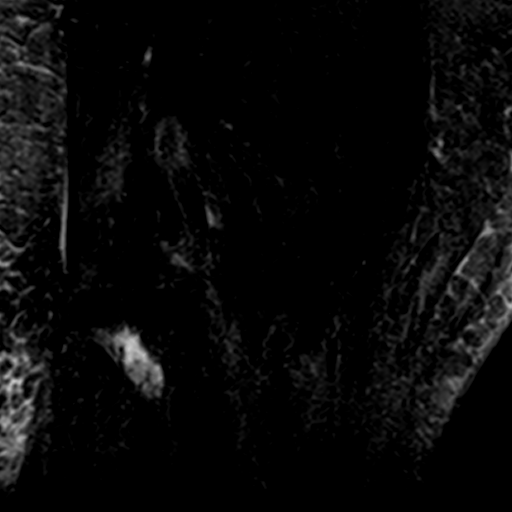

[16 of 40 positions shown; findings below may reference images not displayed]

FINDINGS: Despite efforts by the technologist and patient, mild motion
artifact is present on today's exam and could not be eliminated.
This reduces exam sensitivity and specificity.

MENISCI

Medial meniscus: The posterior horn and body are diminutive with
irregular contours and free edge truncation, consistent with a
degenerative tear in the absence of previous meniscal surgery. The
meniscal root appears intact. No centrally displaced fragment
identified.

Lateral meniscus:  Intact with normal morphology.

LIGAMENTS

Cruciates:  Intact.

Collaterals:  Intact.

CARTILAGE

Patellofemoral: Severe patellofemoral degenerative changes with
diffuse chondral thinning and large patellofemoral osteophytes.

Medial: Mild chondral thinning, surface irregularity and osteophyte
formation.

Lateral: Mild chondral thinning with prominent central and
peripheral osteophytes.

MISCELLANEOUS

Joint: Small joint effusion. Very large ossified intra-articular
loose bodies are again noted in the suprapatellar bursa. These
measure up to 4.6 cm on sagittal image 25.

Popliteal Fossa: No significant Baker's cyst. There is a 12 mm
ganglion posterior to the distal femur.

Extensor Mechanism:  Intact.

Bones:  No acute or significant extra-articular osseous findings.

Other: No significant periarticular soft tissue findings.
IMPRESSION: 1. Degenerative tear of the posterior horn and body of the medial
meniscus.
2. Tricompartmental osteoarthritis, severe within the patellofemoral
compartment. Large intra-articular loose bodies are again noted as
seen on prior radiographs. These may be the sequela of prior trauma
or CPPD arthropathy.

## 2017-12-20 ENCOUNTER — Other Ambulatory Visit: Payer: Self-pay | Admitting: *Deleted

## 2017-12-20 ENCOUNTER — Ambulatory Visit (INDEPENDENT_AMBULATORY_CARE_PROVIDER_SITE_OTHER): Payer: Medicare Other | Admitting: *Deleted

## 2017-12-20 DIAGNOSIS — I4891 Unspecified atrial fibrillation: Secondary | ICD-10-CM | POA: Diagnosis not present

## 2017-12-20 DIAGNOSIS — I4892 Unspecified atrial flutter: Secondary | ICD-10-CM | POA: Diagnosis not present

## 2017-12-20 DIAGNOSIS — Z5181 Encounter for therapeutic drug level monitoring: Secondary | ICD-10-CM | POA: Diagnosis not present

## 2017-12-20 DIAGNOSIS — I2699 Other pulmonary embolism without acute cor pulmonale: Secondary | ICD-10-CM | POA: Diagnosis not present

## 2017-12-20 LAB — POCT INR: INR: 1.8

## 2017-12-20 MED ORDER — WARFARIN SODIUM 2.5 MG PO TABS: ORAL_TABLET | ORAL | 3 refills | Status: DC

## 2017-12-20 NOTE — Patient Instructions (Signed)
Increase coumadin to 1 tablet  (2.5mg ) daily except 2 tablets (5mg )  on Mondays, Wednesdays and Fridays Recheck in 4 weeks

## 2017-12-21 ENCOUNTER — Telehealth: Payer: Self-pay | Admitting: *Deleted

## 2017-12-21 MED ORDER — WARFARIN SODIUM 2.5 MG PO TABS: ORAL_TABLET | ORAL | 3 refills | Status: DC

## 2017-12-21 NOTE — Telephone Encounter (Signed)
PLEASE CALL [facility] @ [phone] CONCERNING PT'S WARFARIN, AND HER PHARMACY NEEDS TO BE CHANGED TO [place]

## 2017-12-21 NOTE — Telephone Encounter (Signed)
Called.  Warfarin RX sent as requested and [place] removed from chart.

## 2017-12-25 ENCOUNTER — Other Ambulatory Visit: Payer: Self-pay

## 2017-12-25 ENCOUNTER — Encounter (HOSPITAL_COMMUNITY): Payer: Self-pay

## 2017-12-25 ENCOUNTER — Emergency Department (HOSPITAL_COMMUNITY): Payer: Medicare Other

## 2017-12-25 ENCOUNTER — Emergency Department (HOSPITAL_COMMUNITY)
Admission: EM | Admit: 2017-12-25 | Discharge: 2017-12-25 | Disposition: A | Discharge status: Skilled Nursing Facility | Payer: Medicare Other | Attending: Emergency Medicine | Admitting: Emergency Medicine

## 2017-12-25 DIAGNOSIS — I69959 Hemiplegia and hemiparesis following unspecified cerebrovascular disease affecting unspecified side: Secondary | ICD-10-CM | POA: Insufficient documentation

## 2017-12-25 DIAGNOSIS — Z91040 Latex allergy status: Secondary | ICD-10-CM | POA: Insufficient documentation

## 2017-12-25 DIAGNOSIS — I10 Essential (primary) hypertension: Secondary | ICD-10-CM | POA: Insufficient documentation

## 2017-12-25 DIAGNOSIS — L03116 Cellulitis of left lower limb: Secondary | ICD-10-CM | POA: Insufficient documentation

## 2017-12-25 DIAGNOSIS — R531 Weakness: Secondary | ICD-10-CM | POA: Diagnosis not present

## 2017-12-25 DIAGNOSIS — E860 Dehydration: Secondary | ICD-10-CM | POA: Diagnosis not present

## 2017-12-25 DIAGNOSIS — Z87891 Personal history of nicotine dependence: Secondary | ICD-10-CM | POA: Diagnosis not present

## 2017-12-25 DIAGNOSIS — Z79899 Other long term (current) drug therapy: Secondary | ICD-10-CM | POA: Insufficient documentation

## 2017-12-25 DIAGNOSIS — Z7901 Long term (current) use of anticoagulants: Secondary | ICD-10-CM | POA: Diagnosis not present

## 2017-12-25 DIAGNOSIS — I2510 Atherosclerotic heart disease of native coronary artery without angina pectoris: Secondary | ICD-10-CM | POA: Diagnosis not present

## 2017-12-25 LAB — CBC WITH DIFFERENTIAL/PLATELET
BASOS ABS: 0 10*3/uL (ref 0.0–0.1)
Basophils Relative: 0 %
EOS PCT: 4 %
Eosinophils Absolute: 0.1 10*3/uL (ref 0.0–0.7)
HCT: 37.3 % (ref 36.0–46.0)
HEMOGLOBIN: 11.7 g/dL — AB (ref 12.0–15.0)
LYMPHS ABS: 1.2 10*3/uL (ref 0.7–4.0)
Lymphocytes Relative: 46 %
MCH: 30.4 pg (ref 26.0–34.0)
MCHC: 31.4 g/dL (ref 30.0–36.0)
MCV: 96.9 fL (ref 78.0–100.0)
MONO ABS: 0.4 10*3/uL (ref 0.1–1.0)
MONOS PCT: 14 %
NEUTROS PCT: 36 %
Neutro Abs: 1 10*3/uL — ABNORMAL LOW (ref 1.7–7.7)
PLATELETS: 314 10*3/uL (ref 150–400)
RBC: 3.85 MIL/uL — AB (ref 3.87–5.11)
RDW: 14.4 % (ref 11.5–15.5)
WBC: 6.8 10*3/uL (ref 4.0–10.5)

## 2017-12-25 LAB — URINALYSIS, ROUTINE W REFLEX MICROSCOPIC
BILIRUBIN URINE: NEGATIVE
Bacteria, UA: NONE SEEN
GLUCOSE, UA: NEGATIVE mg/dL
KETONES UR: NEGATIVE mg/dL
LEUKOCYTES UA: NEGATIVE
Nitrite: NEGATIVE
PH: 7 (ref 5.0–8.0)
PROTEIN: NEGATIVE mg/dL
Specific Gravity, Urine: 1.018 (ref 1.005–1.030)

## 2017-12-25 LAB — COMPREHENSIVE METABOLIC PANEL
ALK PHOS: 63 U/L (ref 38–126)
ALT: 11 U/L — ABNORMAL LOW (ref 14–54)
ANION GAP: 5 (ref 5–15)
AST: 11 U/L — ABNORMAL LOW (ref 15–41)
Albumin: 3.2 g/dL — ABNORMAL LOW (ref 3.5–5.0)
BILIRUBIN TOTAL: 0.6 mg/dL (ref 0.3–1.2)
BUN: 17 mg/dL (ref 6–20)
CALCIUM: 8 mg/dL — AB (ref 8.9–10.3)
CO2: 26 mmol/L (ref 22–32)
Chloride: 113 mmol/L — ABNORMAL HIGH (ref 101–111)
Creatinine, Ser: 0.66 mg/dL (ref 0.44–1.00)
Glucose, Bld: 99 mg/dL (ref 65–99)
POTASSIUM: 3.5 mmol/L (ref 3.5–5.1)
Sodium: 144 mmol/L (ref 135–145)
Total Protein: 6.2 g/dL — ABNORMAL LOW (ref 6.5–8.1)

## 2017-12-25 LAB — POC OCCULT BLOOD, ED: Fecal Occult Bld: NEGATIVE

## 2017-12-25 LAB — PROTIME-INR
INR: 3.31
Prothrombin Time: 33.3 seconds — ABNORMAL HIGH (ref 11.4–15.2)

## 2017-12-25 LAB — TROPONIN I

## 2017-12-25 LAB — LIPASE, BLOOD: LIPASE: 22 U/L (ref 11–51)

## 2017-12-25 MED ORDER — DOXYCYCLINE HYCLATE 100 MG PO CAPS: 100.0000 mg | ORAL_CAPSULE | Freq: Two times a day (BID) | ORAL | 0 refills | Status: DC

## 2017-12-25 MED ORDER — SODIUM CHLORIDE 0.9 % IV BOLUS
1000.0000 mL | Freq: Once | INTRAVENOUS | Status: AC
  Administered 2017-12-25: 1000 mL via INTRAVENOUS

## 2017-12-25 MED ORDER — SODIUM CHLORIDE 0.9 % IV SOLN
Freq: Once | INTRAVENOUS | Status: AC
Start: 2017-12-25 — End: 2017-12-25
  Administered 2017-12-25: 15:00:00 via INTRAVENOUS

## 2017-12-25 MED ORDER — DOXYCYCLINE HYCLATE 100 MG PO TABS
100.0000 mg | ORAL_TABLET | Freq: Once | ORAL | Status: AC
  Administered 2017-12-25: 100 mg via ORAL
  Filled 2017-12-25: qty 1

## 2017-12-25 MED ORDER — SODIUM CHLORIDE 0.9 % IV SOLN
Freq: Once | INTRAVENOUS | Status: AC
  Administered 2017-12-25: 15:00:00 via INTRAVENOUS

## 2017-12-25 NOTE — ED Notes (Signed)
Patient transported to CT 

## 2017-12-25 NOTE — ED Notes (Signed)
CRITICAL VALUE ALERT  Critical Value:  hgb 5  Date & Time Notied: 1108 12/25/17  Provider Notified: cook  Orders Received/Actions taken: none

## 2017-12-25 NOTE — ED Provider Notes (Signed)
[facility] EMERGENCY DEPARTMENT Provider Note   CSN: [id number] Arrival date & time: 12/25/17  0909     History   Chief Complaint Chief Complaint  Patient presents with  . Weakness    HPI Deborah Maddox is a 62 y.o. female.  Level 5 caveat for weakness and inability to give full history.  [facility] states patient is usually more alert.  Questionable left lower extremity redness.  No meningeal signs, cough, dysuria.  She is more sleepy than usual.  She has many health problems including stroke at an early age, HTN, many others     Past Medical History:  Diagnosis Date  . Anxiety   . Arthritis   . Cancer (HCC)    cervical  . 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 Coronary artery disease   . Foot drop    left foot  . Hypertension   . Left foot drop   . 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 PONV (postoperative nausea and vomiting)   . Seizures ([place])    last one 10 years ago  . Sleep apnea   . Stroke [facility])    at age 35, left sided weakness    Patient Active Problem List   Diagnosis Date Noted  . Lower back pain 09/28/2017  . Seizures ([place]) 09/28/2017  . MRSA (methicillin resistant staph aureus) positive 09/28/2017  . Cellulitis of abdominal wall 09/27/2017  . Sleep apnea 09/27/2017  . Anxiety 09/27/2017  . Coronary artery disease 09/27/2017  . Foot drop 06/01/2017  . Encounter for therapeutic drug monitoring 09/14/2013  . Long term (current) use of anticoagulants 12/14/2012  . Atrial fibrillation ([place]) 12/14/2012  . Acute pulmonary embolism ([place]) 12/05/2012  . Atrial flutter ([place]) 12/05/2012  . Constipation 09/28/2012  . Rectal bleeding 09/28/2012  . Heart palpitations 02/01/2012  . Hypertension 02/01/2012  . Chest pain at rest 02/01/2012    Past Surgical History:  Procedure Laterality Date  . BREAST SURGERY    . CERVICAL CONE BIOPSY    . COLONOSCOPY  2006   Dr. Anthony Sar: normal colonoscopy   . COLONOSCOPY WITH  PROPOFOL N/A 10/06/2012   Procedure: COLONOSCOPY WITH PROPOFOL;  Surgeon: Daneil Dolin, MD;  Location: [facility];  Service: Endoscopy;  Laterality: N/A;  entered cecum @ 0905; total cecal withdrawal time=  57minutes  . DILATION AND CURETTAGE OF UTERUS    . LEG SURGERY    . LEG SURGERY     left  . OPEN REDUCTION INTERNAL FIXATION (ORIF) HAND     right hand and arm  . POLYPECTOMY N/A 10/06/2012   Procedure: POLYPECTOMY;  Surgeon: Daneil Dolin, MD;  Location: [facility];  Service: Endoscopy;  Laterality: N/A;  hepatic flexure polyp  . TONSILLECTOMY       OB History    Gravida  3   Para  2   Term  2   Preterm      AB  1   Living        SAB  1   TAB      Ectopic      Multiple      Live Births               Home Medications    Prior to Admission medications   Medication Sig Start Date End Date Taking? Authorizing Provider  atorvastatin (LIPITOR) 10 MG tablet Take 10 mg by mouth  at bedtime.   Yes [provider]  baclofen (LIORESAL) 10 MG tablet Take 10 mg by mouth at bedtime.   Yes [provider]  Cholecalciferol (VITAMIN D) 2000 UNITS CAPS Take 1 capsule by mouth daily.   Yes [provider]  cyanocobalamin (,VITAMIN B-12,) 1000 MCG/ML injection Inject 1,000 mcg into the muscle every 30 (thirty) days. 15th of each month   Yes [provider]  diclofenac sodium (VOLTAREN) 1 % GEL Apply 2-4 g topically 3 (three) times daily. to knees   Yes [provider]  diltiazem (TIAZAC) 300 MG 24 hr capsule Take 300 mg by mouth daily.   Yes [provider]  docusate sodium (COLACE) 100 MG capsule Take 100 mg by mouth 2 (two) times daily.    Yes [provider]  DULoxetine (CYMBALTA) 60 MG capsule Take 60 mg by mouth daily.     Yes [provider]  fluticasone (FLONASE) 50 MCG/ACT nasal spray Place 2 sprays into both nostrils daily.   Yes [provider]  gabapentin (NEURONTIN) 300 MG capsule Take 300 mg  by mouth 3 (three) times daily.   Yes [provider]  Linaclotide (LINZESS) 290 MCG CAPS Take 1 capsule by mouth daily. 30 minutes before breakfast. 10/13/12  Yes Annitta Needs, NP  lisinopril (PRINIVIL,ZESTRIL) 40 MG tablet Take 40 mg by mouth daily.     Yes [provider]  loratadine (CLARITIN) 10 MG tablet Take 10 mg by mouth daily.   Yes [provider]  montelukast (SINGULAIR) 10 MG tablet Take 10 mg by mouth daily.   Yes [provider]  omeprazole (PRILOSEC) 20 MG capsule Take 20 mg by mouth daily.     Yes [provider]  oxybutynin (DITROPAN) 5 MG tablet Take 5 mg by mouth 2 (two) times daily.   Yes [provider]  senna (SENOKOT) 8.6 MG TABS Take 2 tablets by mouth daily.   Yes [provider]  topiramate (TOPAMAX) 50 MG tablet Take 50 mg by mouth 2 (two) times daily.    Yes [provider]  warfarin (COUMADIN) 2.5 MG tablet Take 1 tablet daily (2.5mg ) except 2 tablets (5mg ) on Mondays, Wednesdays and Fridays 12/21/17  Yes Branch, Alphonse Guild, MD  doxycycline (VIBRAMYCIN) 100 MG capsule Take 1 capsule (100 mg total) by mouth 2 (two) times daily. 12/25/17   Nat Christen, MD    Family History Family History  Problem Relation Age of Onset  . Diabetes Mother   . Diabetes Sister   . Cancer Brother        "all over"   . Cancer Other   . Diabetes Other   . Colon cancer Unknown        unsure    Social History Social History   Tobacco Use  . Smoking status: Former Smoker    Years: 5.00    Last attempt to quit: 03/25/1981    Years since quitting: 36.7  . Smokeless tobacco: Never Used  Substance Use Topics  . Alcohol use: No  . Drug use: No     Allergies   Vancomycin; Latex; and Other   Review of Systems Review of Systems  All other systems reviewed and are negative.    Physical Exam Updated Vital Signs BP 126/67   Pulse 74   Temp (!) 97.3 F (36.3 C) (Oral)   Resp 18   Ht 5\' 3"  (1.6 m)   Wt  126.1 kg (278 lb)  SpO2 96%   BMI 49.25 kg/m   Physical Exam  Constitutional:  Drowsy, easily arousable.  HENT:  Head: Normocephalic and atraumatic.  Eyes: Conjunctivae are normal.  Neck: Neck supple.  Cardiovascular: Normal rate and regular rhythm.  Pulmonary/Chest: Effort normal and breath sounds normal.  Abdominal: Soft. Bowel sounds are normal.  Musculoskeletal: Normal range of motion.  Neurological: She is alert.  Skin:  Left lower extremity: Area of erythema on the distal lateral aspect of the fibula area approximately 5 to 6 cm in diameter  Psychiatric: She has a normal mood and affect. Her behavior is normal.  Nursing note and vitals reviewed.    ED Treatments / Results  Labs (all labs ordered are listed, but only abnormal results are displayed) Labs Reviewed  CBC WITH DIFFERENTIAL/PLATELET - Abnormal; Notable for the following components:      Result Value   RBC 3.85 (*)    Hemoglobin 11.7 (*)    Neutro Abs 1.0 (*)    All other components within normal limits  URINALYSIS, ROUTINE W REFLEX MICROSCOPIC - Abnormal; Notable for the following components:   APPearance HAZY (*)    Hgb urine dipstick SMALL (*)    RBC / HPF >50 (*)    All other components within normal limits  COMPREHENSIVE METABOLIC PANEL - Abnormal; Notable for the following components:   Chloride 113 (*)    Calcium 8.0 (*)    Total Protein 6.2 (*)    Albumin 3.2 (*)    AST 11 (*)    ALT 11 (*)    All other components within normal limits  PROTIME-INR - Abnormal; Notable for the following components:   Prothrombin Time 33.3 (*)    All other components within normal limits  LIPASE, BLOOD  TROPONIN I  POC OCCULT BLOOD, ED    EKG EKG Interpretation  Date/Time:  Saturday Dec 25 2017 09:10:18 EDT Ventricular Rate:  74 PR Interval:    QRS Duration: 99 QT Interval:  398 QTC Calculation: 442 R Axis:   20 Text Interpretation:  Atrial fibrillation Low voltage, precordial leads Nonspecific T  abnormalities, inferior leads Confirmed by Nat Christen [phone]) on 12/25/2017 10:15:21 AM Also confirmed by Nat Christen [phone]), editor Philomena Doheny [phone])  on 12/25/2017 12:36:36 PM Also confirmed by Nat Christen [phone])  on 12/25/2017 2:25:47 PM   Radiology Dg Chest 2 View  Result Date: 12/25/2017 CLINICAL DATA:  Weakness. EXAM: CHEST - 2 VIEW COMPARISON:  Chest x-ray dated May 31, 2015. FINDINGS: Stable cardiomegaly. Mild pulmonary vascular congestion. No focal consolidation, pleural effusion, or pneumothorax. No acute osseous abnormality. IMPRESSION: Cardiomegaly and mild pulmonary vascular congestion. Electronically Signed   By: Titus Dubin M.D.   On: 12/25/2017 11:03   Ct Head Wo Contrast  Result Date: 12/25/2017 CLINICAL DATA:  Lethargy, left lower extremity swelling EXAM: CT HEAD WITHOUT CONTRAST TECHNIQUE: Contiguous axial images were obtained from the base of the skull through the vertex without intravenous contrast. COMPARISON:  05/31/2015 FINDINGS: Brain: No evidence of acute infarction, hemorrhage, hydrocephalus, extra-axial collection or mass lesion/mass effect. Extensive right MCA distribution and more focal right cerebellar encephalomalacia, chronic. Mild diffuse atrophy elsewhere. Vascular: No hyperdense vessel or unexpected calcification. Skull: Normal. Negative for fracture or focal lesion. Sinuses/Orbits: Small retention cyst or polyp in the right maxillary sinus, otherwise negative Other: None. IMPRESSION: 1. Negative for bleed or other acute intracranial process. 2. 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 Electronically Signed   By: Lucrezia Europe M.D.   On:  12/25/2017 15:10    Procedures Procedures (including critical care time)  Medications Ordered in ED Medications  sodium chloride 0.9 % bolus 1,000 mL (0 mLs Intravenous Stopped 12/25/17 1201)  0.9 %  sodium chloride infusion ( Intravenous Stopped 12/25/17 1532)  0.9 %  sodium chloride infusion ( Intravenous Stopped 12/25/17  1811)  doxycycline (VIBRA-TABS) tablet 100 mg (100 mg Oral Given 12/25/17 1628)     Initial Impression / Assessment and Plan / ED Course  I have reviewed the triage vital signs and the nursing notes.  Pertinent labs & imaging results that were available during my care of the patient were reviewed by me and considered in my medical decision making (see chart for details).    Patient presents with excessive sleepiness and soft tissue erythema on the left lower extremity.  Labs, urinalysis, chest x-ray, CT head all reassuring.  She responded well to IV hydration.  She became much more alert.  Will discharge home on doxycycline 100 mg twice daily for her cellulitis.   Final Clinical Impressions(s) / ED Diagnoses   Final diagnoses:  Weakness  Cellulitis of left lower extremity  Dehydration    ED Discharge Orders        Ordered    doxycycline (VIBRAMYCIN) 100 MG capsule  2 times daily     12/25/17 1637       Nat Christen, MD 12/26/17 [phone]

## 2017-12-25 NOTE — ED Notes (Signed)
Dr. Lacinda Axon made aware of pt's hbg, rbc and wbc correction, previous blood collection was diluted per lab.

## 2017-12-25 NOTE — ED Notes (Addendum)
Lab called and will redraw CBC due to questionable values. Dr Lacinda Axon notified

## 2017-12-25 NOTE — ED Triage Notes (Addendum)
Pt from [place], [facility] reports patient is usually alert and oriented, pt extremely lethargic and states "I can't seem to wake up."  Left lower extremity red and swollen.

## 2017-12-25 NOTE — ED Provider Notes (Signed)
6:30 PM patient resting in bed. Alert, pocket. appears in no distress. Results for orders placed or performed during the hospital encounter of 12/25/17  CBC with Differential  Result Value Ref Range   WBC 6.8 4.0 - 10.5 K/uL   RBC 3.85 (L) 3.87 - 5.11 MIL/uL   Hemoglobin 11.7 (L) 12.0 - 15.0 g/dL   HCT 37.3 36.0 - 46.0 %   MCV 96.9 78.0 - 100.0 fL   MCH 30.4 26.0 - 34.0 pg   MCHC 31.4 30.0 - 36.0 g/dL   RDW 14.4 11.5 - 15.5 %   Platelets 314 150 - 400 K/uL   Neutrophils Relative % 36 %   Lymphocytes Relative 46 %   Monocytes Relative 14 %   Eosinophils Relative 4 %   Basophils Relative 0 %   Neutro Abs 1.0 (L) 1.7 - 7.7 K/uL   Lymphs Abs 1.2 0.7 - 4.0 K/uL   Monocytes Absolute 0.4 0.1 - 1.0 K/uL   Eosinophils Absolute 0.1 0.0 - 0.7 K/uL   Basophils Absolute 0.0 0.0 - 0.1 K/uL  Urinalysis, Routine w reflex microscopic  Result Value Ref Range   Color, Urine YELLOW YELLOW   APPearance HAZY (A) CLEAR   Specific Gravity, Urine 1.018 1.005 - 1.030   pH 7.0 5.0 - 8.0   Glucose, UA NEGATIVE NEGATIVE mg/dL   Hgb urine dipstick SMALL (A) NEGATIVE   Bilirubin Urine NEGATIVE NEGATIVE   Ketones, ur NEGATIVE NEGATIVE mg/dL   Protein, ur NEGATIVE NEGATIVE mg/dL   Nitrite NEGATIVE NEGATIVE   Leukocytes, UA NEGATIVE NEGATIVE   RBC / HPF >50 (H) 0 - 5 RBC/hpf   WBC, UA 0-5 0 - 5 WBC/hpf   Bacteria, UA NONE SEEN NONE SEEN   Squamous Epithelial / LPF 0-5 0 - 5   Mucus PRESENT   Comprehensive metabolic panel  Result Value Ref Range   Sodium 144 135 - 145 mmol/L   Potassium 3.5 3.5 - 5.1 mmol/L   Chloride 113 (H) 101 - 111 mmol/L   CO2 26 22 - 32 mmol/L   Glucose, Bld 99 65 - 99 mg/dL   BUN 17 6 - 20 mg/dL   Creatinine, Ser 0.66 0.44 - 1.00 mg/dL   Calcium 8.0 (L) 8.9 - 10.3 mg/dL   Total Protein 6.2 (L) 6.5 - 8.1 g/dL   Albumin 3.2 (L) 3.5 - 5.0 g/dL   AST 11 (L) 15 - 41 U/L   ALT 11 (L) 14 - 54 U/L   Alkaline Phosphatase 63 38 - 126 U/L   Total Bilirubin 0.6 0.3 - 1.2 mg/dL   GFR  calc non Af Amer >60 >60 mL/min   GFR calc Af Amer >60 >60 mL/min   Anion gap 5 5 - 15  Lipase, blood  Result Value Ref Range   Lipase 22 11 - 51 U/L  Troponin I  Result Value Ref Range   Troponin I <0.03 <0.03 ng/mL  Protime-INR  Result Value Ref Range   Prothrombin Time 33.3 (H) 11.4 - 15.2 seconds   INR 3.31   POC occult blood, ED Provider will collect  Result Value Ref Range   Fecal Occult Bld NEGATIVE NEGATIVE   Dg Chest 2 View  Result Date: 12/25/2017 CLINICAL DATA:  Weakness. EXAM: CHEST - 2 VIEW COMPARISON:  Chest x-ray dated May 31, 2015. FINDINGS: Stable cardiomegaly. Mild pulmonary vascular congestion. No focal consolidation, pleural effusion, or pneumothorax. No acute osseous abnormality. IMPRESSION: Cardiomegaly and mild pulmonary vascular congestion. Electronically Signed  By: Titus Dubin M.D.   On: 12/25/2017 11:03   Ct Head Wo Contrast  Result Date: 12/25/2017 CLINICAL DATA:  Lethargy, left lower extremity swelling EXAM: CT HEAD WITHOUT CONTRAST TECHNIQUE: Contiguous axial images were obtained from the base of the skull through the vertex without intravenous contrast. COMPARISON:  05/31/2015 FINDINGS: Brain: No evidence of acute infarction, hemorrhage, hydrocephalus, extra-axial collection or mass lesion/mass effect. Extensive right MCA distribution and more focal right cerebellar encephalomalacia, chronic. Mild diffuse atrophy elsewhere. Vascular: No hyperdense vessel or unexpected calcification. Skull: Normal. Negative for fracture or focal lesion. Sinuses/Orbits: Small retention cyst or polyp in the right maxillary sinus, otherwise negative Other: None. IMPRESSION: 1. Negative for bleed or other acute intracranial process. 2. 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 Electronically Signed   By: Lucrezia Europe M.D.   On: 12/25/2017 15:10     Orlie Dakin, MD 12/25/17 2348

## 2017-12-25 NOTE — Discharge Instructions (Addendum)
We will treat you for a skin infection of your left lower leg with doxycycline.  First dose given in the emergency department. Make sure that you drink at least six 8 ounce glasses of water each day in order to stay well-hydrated. Take Tylenol 650 mg every 4 hours as needed for temperature higher than 100.4 or for aches. Call Dr. Maudie Mercury to arrange to be seen in the office if not feeling better in the next week. Return for vomiting, or if your condition worsens for any reason

## 2018-01-07 ENCOUNTER — Other Ambulatory Visit: Payer: Self-pay

## 2018-01-07 ENCOUNTER — Ambulatory Visit (INDEPENDENT_AMBULATORY_CARE_PROVIDER_SITE_OTHER): Payer: Medicare Other | Admitting: Gastroenterology

## 2018-01-07 ENCOUNTER — Encounter: Payer: Self-pay | Admitting: Gastroenterology

## 2018-01-07 ENCOUNTER — Telehealth: Payer: Self-pay

## 2018-01-07 VITALS — BP 138/73 | HR 89 | Temp 97.0°F | Ht 63.0 in | Wt 277.8 lb

## 2018-01-07 DIAGNOSIS — Z7901 Long term (current) use of anticoagulants: Secondary | ICD-10-CM

## 2018-01-07 DIAGNOSIS — Z86010 Personal history of colonic polyps: Secondary | ICD-10-CM

## 2018-01-07 DIAGNOSIS — Z860101 Personal history of adenomatous and serrated colon polyps: Secondary | ICD-10-CM | POA: Insufficient documentation

## 2018-01-07 MED ORDER — PEG 3350-KCL-NA BICARB-NACL 420 G PO SOLR: 4000.0000 mL | ORAL | 0 refills | Status: DC

## 2018-01-07 NOTE — Telephone Encounter (Signed)
   Patient is scheduled for colonoscopy 03/17/18 at 10:00am. She has h/o Afib, CVA, and pulmonary emboli.   Does she need Lovenox bridge?

## 2018-01-07 NOTE — Patient Instructions (Signed)
1. We will schedule you for a colonoscopy in the near future. Further instructions to follow.

## 2018-01-07 NOTE — Assessment & Plan Note (Signed)
62 year old female presenting for surveillance colonoscopy for history of tubular adenoma removed 5 years ago.  Since we last saw her she developed a pulmonary embolus and is now on chronic anticoagulation.  Also with history of A. fib.  Patient desires surveillance colonoscopy.  We will plan for deep sedation given polypharmacy.   I have discussed the risks, alternatives, benefits with regards to but not limited to the risk of reaction to medication, bleeding, infection, perforation and the patient is agreeable to proceed. Written consent to be obtained.  We will touch base with cardiology to discuss whether or not she needs a Lovenox bridge.  Further recommendations to follow.

## 2018-01-07 NOTE — Telephone Encounter (Signed)
Yes, she still needs Lovenox bridge.  Do you want me to take care of it or do you want to send the order to [facility]?

## 2018-01-07 NOTE — Telephone Encounter (Signed)
She is scheduled for TCS w/Propofol w/RMR 03/17/18 at 10:00am.

## 2018-01-07 NOTE — Progress Notes (Signed)
Primary Care Physician:  Jani Gravel, MD  Primary Gastroenterologist:  Garfield Cornea, MD   Chief Complaint  Patient presents with  . Colonoscopy    consult. last one was 5 years ago    HPI:  Deborah Maddox is a 62 y.o. female here to schedule surveillance colonoscopy.  She had a colonoscopy in February 2014 and was found to have tubular adenoma in the hepatic flexure, removed.  Advised to come back in 5 years for surveillance.  She is now on Coumadin for history of A. fib, PE.  She resides at an assisted living group.  She has a history of stroke with left-sided weakness, sustained stroke in her 2s.  History of remote PE (2014), A. fib on chronic anticoagulation.  History of MRSA and abdominal wall cellulitis.  Typically has a bowel movement once per day.  She is on Linzess.  Sometimes stools are watery but it other times formed.  Usually only 1/day. Rare brbpr, she relates to her hemorrhoids.  Sometimes occurs with straining to have a bowel movement.  Some abdominal bloating and gas.  No heartburn.  No dysphagia.  No abdominal pain.   Current Outpatient Medications  Medication Sig Dispense Refill  . atorvastatin (LIPITOR) 10 MG tablet Take 10 mg by mouth at bedtime.    . baclofen (LIORESAL) 10 MG tablet Take 10 mg by mouth at bedtime.    . Cholecalciferol (VITAMIN D) 2000 UNITS CAPS Take 1 capsule by mouth daily.    . cyanocobalamin (,VITAMIN B-12,) 1000 MCG/ML injection Inject 1,000 mcg into the muscle every 30 (thirty) days. 15th of each month    . diclofenac sodium (VOLTAREN) 1 % GEL Apply 2-4 g topically 3 (three) times daily. to knees    . diltiazem (TIAZAC) 300 MG 24 hr capsule Take 300 mg by mouth daily.    Marland Kitchen docusate sodium (COLACE) 100 MG capsule Take 100 mg by mouth 2 (two) times daily.     . DULoxetine (CYMBALTA) 60 MG capsule Take 60 mg by mouth daily.      . fluticasone (FLONASE) 50 MCG/ACT nasal spray Place 2 sprays into both nostrils daily.    Marland Kitchen gabapentin (NEURONTIN) 300 MG  capsule Take 300 mg by mouth 3 (three) times daily.    . Linaclotide (LINZESS) 290 MCG CAPS Take 1 capsule by mouth daily. 30 minutes before breakfast. 30 capsule 3  . lisinopril (PRINIVIL,ZESTRIL) 40 MG tablet Take 40 mg by mouth daily.      Marland Kitchen loratadine (CLARITIN) 10 MG tablet Take 10 mg by mouth daily.    . montelukast (SINGULAIR) 10 MG tablet Take 10 mg by mouth daily.    Marland Kitchen omeprazole (PRILOSEC) 20 MG capsule Take 20 mg by mouth daily.      Marland Kitchen oxybutynin (DITROPAN) 5 MG tablet Take 5 mg by mouth 2 (two) times daily.    Marland Kitchen senna (SENOKOT) 8.6 MG TABS Take 2 tablets by mouth daily.    Marland Kitchen topiramate (TOPAMAX) 50 MG tablet Take 50 mg by mouth 2 (two) times daily.     Marland Kitchen warfarin (COUMADIN) 2.5 MG tablet Take 1 tablet daily (2.5mg ) except 2 tablets (5mg ) on Mondays, Wednesdays and Fridays 45 tablet 3  . polyethylene glycol-electrolytes (TRILYTE) 420 g solution Take 4,000 mLs by mouth as directed. 4000 mL 0   No current facility-administered medications for this visit.     Allergies as of 01/07/2018 - Review Complete 01/07/2018  Allergen Reaction Noted  . Vancomycin Hives and Itching 09/27/2017  .  Latex Rash 03/26/2011  . Other Rash 03/26/2011    Past Medical History:  Diagnosis Date  . A-fib ([place])   . Anxiety   . Arthritis   . Cancer (H[facility])    cervical  . 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 Coronary artery disease   . Foot drop    left foot  . Hypertension   . Left foot drop   . 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 PONV (postoperative nausea and vomiting)   . Pulmonary emboli ([place]) 2014  . Seizures ([place])    last one 10 years ago  . Sleep apnea    Refuses to use machine because of recurrent pneumonia  . Stroke [facility])    at age 8, left sided weakness    Past Surgical History:  Procedure Laterality Date  . BREAST SURGERY    . CERVICAL CONE BIOPSY     cervical cancer  . COLONOSCOPY  2006   Dr. Anthony Sar: normal colonoscopy   . COLONOSCOPY WITH  PROPOFOL N/A 10/06/2012   Dr. Gala Romney: prominent external hemorrhoids, colonic lipoma. hepatic flexure polyp, tubular adenoma.   Marland Kitchen DILATION AND CURETTAGE OF UTERUS    . LEG SURGERY    . LEG SURGERY     left  . OPEN REDUCTION INTERNAL FIXATION (ORIF) HAND     right hand and arm  . POLYPECTOMY N/A 10/06/2012   Procedure: POLYPECTOMY;  Surgeon: Daneil Dolin, MD;  Location: [facility];  Service: Endoscopy;  Laterality: N/A;  hepatic flexure polyp  . TONSILLECTOMY      Family History  Problem Relation Age of Onset  . Diabetes Mother   . Liver cancer Mother   . Diabetes Sister   . Cancer Brother        "all over"   . Cancer Other        uncle  . Diabetes Other   . Colon cancer Unknown        unsure  . Mesothelioma Father     Social History   Socioeconomic History  . Marital status: Single    Spouse name: Not on file  . Number of children: Not on file  . Years of education: Not on file  . Highest education level: Not on file  Occupational History  . Not on file  Social Needs  . Financial resource strain: Not on file  . Food insecurity:    Worry: Not on file    Inability: Not on file  . Transportation needs:    Medical: Not on file    Non-medical: Not on file  Tobacco Use  . Smoking status: Former Smoker    Years: 5.00    Last attempt to quit: 03/25/1981    Years since quitting: 36.8  . Smokeless tobacco: Never Used  Substance and Sexual Activity  . Alcohol use: No  . Drug use: No  . Sexual activity: Never    Birth control/protection: Post-menopausal  Lifestyle  . Physical activity:    Days per week: Not on file    Minutes per session: Not on file  . Stress: Not on file  Relationships  . Social connections:    Talks on phone: Not on file    Gets together: Not on file    Attends religious service: Not on file    Active member of club or organization: Not on file    Attends meetings of clubs or organizations: Not on  file    Relationship status: Not on file  .  Intimate partner violence:    Fear of current or ex partner: Not on file    Emotionally abused: Not on file    Physically abused: Not on file    Forced sexual activity: Not on file  Other Topics Concern  . Not on file  Social History Narrative  . Not on file      ROS:  General: Negative for anorexia, weight loss, fever, chills, fatigue, weakness. Eyes: Negative for vision changes.  ENT: Negative for hoarseness, difficulty swallowing , nasal congestion. CV: Negative for chest pain, angina, palpitations, dyspnea on exertion, peripheral edema.  Respiratory: Negative for dyspnea at rest, dyspnea on exertion, cough, sputum, wheezing.  GI: See history of present illness. GU:  Negative for dysuria, hematuria, urinary incontinence, urinary frequency, nocturnal urination.  MS: Negative for joint pain, low back pain.  Derm: Negative for rash or itching.  Neuro: Negative forfrequent headaches, memory loss, confusion.  Left-sided weakness Psych: Negative for anxiety, depression, suicidal ideation, hallucinations.  Endo: Negative for unusual weight change.  Heme: Negative for bruising or bleeding. Allergy: Negative for rash or hives.    Physical Examination:  BP 138/73   Pulse 89   Temp (!) 97 F (36.1 C) (Oral)   Ht 5\' 3"  (1.6 m)   Wt 277 lb 12.8 oz (126 kg)   BMI 49.21 kg/m    General: Well-nourished, well-developed in no acute distress.  Head: Normocephalic, atraumatic.   Eyes: Conjunctiva pink, no icterus. Mouth: Oropharyngeal mucosa moist and pink , no lesions erythema or exudate. Neck: Supple without thyromegaly, masses, or lymphadenopathy.  Lungs: Clear to auscultation bilaterally.  Heart: Regular rate and rhythm, no murmurs rubs or gallops.  Abdomen: Bowel sounds are normal, nontender, nondistended, no hepatosplenomegaly or masses, no abdominal bruits or    hernia , no rebound or guarding.  Limited due to body habitus Rectal: Deferred Extremities: No lower extremity  edema. No clubbing or deformities.  Neuro: Alert and oriented x 4 , grossly normal neurologically.  Skin: Warm and dry, no rash or jaundice.   Psych: Alert and cooperative, normal mood and affect.  Labs: Lab Results  Component Value Date   LIPASE 22 12/25/2017   Lab Results  Component Value Date   CREATININE 0.66 12/25/2017   BUN 17 12/25/2017   NA 144 12/25/2017   K 3.5 12/25/2017   CL 113 (H) 12/25/2017   CO2 26 12/25/2017   Lab Results  Component Value Date   ALT 11 (L) 12/25/2017   AST 11 (L) 12/25/2017   ALKPHOS 63 12/25/2017   BILITOT 0.6 12/25/2017   Lab Results  Component Value Date   WBC 6.8 12/25/2017   HGB 11.7 (L) 12/25/2017   HCT 37.3 12/25/2017   MCV 96.9 12/25/2017   PLT 314 12/25/2017   Heme negative on 01/04/18  Imaging Studies: Dg Chest 2 View  Result Date: 12/25/2017 CLINICAL DATA:  Weakness. EXAM: CHEST - 2 VIEW COMPARISON:  Chest x-ray dated May 31, 2015. FINDINGS: Stable cardiomegaly. Mild pulmonary vascular congestion. No focal consolidation, pleural effusion, or pneumothorax. No acute osseous abnormality. IMPRESSION: Cardiomegaly and mild pulmonary vascular congestion. Electronically Signed   By: Titus Dubin M.D.   On: 12/25/2017 11:03   Ct Head Wo Contrast  Result Date: 12/25/2017 CLINICAL DATA:  Lethargy, left lower extremity swelling EXAM: CT HEAD WITHOUT CONTRAST TECHNIQUE: Contiguous axial images were obtained from the base of the skull through the vertex  without intravenous contrast. COMPARISON:  05/31/2015 FINDINGS: Brain: No evidence of acute infarction, hemorrhage, hydrocephalus, extra-axial collection or mass lesion/mass effect. Extensive right MCA distribution and more focal right cerebellar encephalomalacia, chronic. Mild diffuse atrophy elsewhere. Vascular: No hyperdense vessel or unexpected calcification. Skull: Normal. Negative for fracture or focal lesion. Sinuses/Orbits: Small retention cyst or polyp in the right maxillary  sinus, otherwise negative Other: None. IMPRESSION: 1. Negative for bleed or other acute intracranial process. 2. 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 Electronically Signed   By: Lucrezia Europe M.D.   On: 12/25/2017 15:10

## 2018-01-09 NOTE — Telephone Encounter (Signed)
Can you please handle Lovenox bridge?  Procedure date 03/17/18, time unknown. We already advised [facility] that patient needs to hold coumadin for 4 days prior the colonoscopy.

## 2018-01-10 NOTE — Telephone Encounter (Signed)
It will take care of it. Thank you.

## 2018-01-10 NOTE — Progress Notes (Signed)
CC'ED TO PCP 

## 2018-01-17 ENCOUNTER — Ambulatory Visit (INDEPENDENT_AMBULATORY_CARE_PROVIDER_SITE_OTHER): Payer: Medicare Other | Admitting: *Deleted

## 2018-01-17 DIAGNOSIS — Z5181 Encounter for therapeutic drug level monitoring: Secondary | ICD-10-CM | POA: Diagnosis not present

## 2018-01-17 DIAGNOSIS — I2699 Other pulmonary embolism without acute cor pulmonale: Secondary | ICD-10-CM

## 2018-01-17 DIAGNOSIS — I4891 Unspecified atrial fibrillation: Secondary | ICD-10-CM | POA: Diagnosis not present

## 2018-01-17 LAB — POCT INR: INR: 3.7 — AB (ref 2.0–3.0)

## 2018-01-17 NOTE — Patient Instructions (Signed)
Hold coumadin tonight then decrease dose to 1 tablet  (2.5mg ) daily except 2 tablets (5mg )  on Mondays and Thursdays Recheck in 3 weeks

## 2018-01-19 ENCOUNTER — Other Ambulatory Visit: Payer: Self-pay | Admitting: *Deleted

## 2018-01-19 MED ORDER — WARFARIN SODIUM 2.5 MG PO TABS: ORAL_TABLET | ORAL | 0 refills | Status: DC

## 2018-01-19 NOTE — Progress Notes (Signed)
Pharmacy had not received updated Coumadin instructions/Rx. Updated Rx sent to [facility]

## 2018-01-27 ENCOUNTER — Other Ambulatory Visit: Payer: Self-pay | Admitting: Cardiology

## 2018-02-07 ENCOUNTER — Ambulatory Visit (INDEPENDENT_AMBULATORY_CARE_PROVIDER_SITE_OTHER): Payer: Medicare Other | Admitting: *Deleted

## 2018-02-07 DIAGNOSIS — I2699 Other pulmonary embolism without acute cor pulmonale: Secondary | ICD-10-CM

## 2018-02-07 DIAGNOSIS — Z5181 Encounter for therapeutic drug level monitoring: Secondary | ICD-10-CM

## 2018-02-07 DIAGNOSIS — I4891 Unspecified atrial fibrillation: Secondary | ICD-10-CM

## 2018-02-07 LAB — POCT INR: INR: 2.4 (ref 2.0–3.0)

## 2018-02-07 NOTE — Patient Instructions (Signed)
Continue coumadin 1 tablet  (2.5mg ) daily except 2 tablets (5mg )  on Mondays and Thursdays Recheck in 4 weeks Pending colonoscopy 03/17/18.  Will hold coumadin 4 days before and bridge with Lovenox.

## 2018-03-09 ENCOUNTER — Other Ambulatory Visit: Payer: Self-pay | Admitting: *Deleted

## 2018-03-09 ENCOUNTER — Telehealth: Payer: Self-pay | Admitting: *Deleted

## 2018-03-09 MED ORDER — ENOXAPARIN SODIUM 120 MG/0.8ML ~~LOC~~ SOLN: 120.0000 mg | SUBCUTANEOUS | 0 refills | Status: DC

## 2018-03-09 NOTE — Telephone Encounter (Signed)
Spoke with Edrick Oh RN and she said that orders had been faxed this morning to [facility] and Dr. Harl Bowie will sign orders for nursing to do lovenox injections.

## 2018-03-09 NOTE — Patient Instructions (Signed)
Deborah Maddox  03/09/2018     @PREFPERIOPPHARMACY @   Your procedure is scheduled on  03/17/2018 .  Report to [facility] at  815   A.M.  Call this number if you have problems the morning of surgery:  [phone]   Remember:  Do not eat or drink after midnight.  You may drink clear liquids until  (follow the instructions given to you) .  Clear liquids allowed are:                    Water, Juice (non-citric and without pulp), Carbonated beverages, Clear Tea, Black Coffee only, Plain Jell-O only, Gatorade and Plain Popsicles only    Take these medicines the morning of surgery with A SIP OF WATER  Flexaril, diltiazem, cymbalta, neurontin, lisinopril, claritin or singulair, prilosec, ditropan.    Do not wear jewelry, make-up or nail polish.  Do not wear lotions, powders, or perfumes, or deodorant.  Do not shave 48 hours prior to surgery.  Men may shave face and neck.  Do not bring valuables to the hospital.  [facility] is not responsible for any belongings or valuables.  Contacts, dentures or bridgework may not be worn into surgery.  Leave your suitcase in the car.  After surgery it may be brought to your room.  For patients admitted to the hospital, discharge time will be determined by your treatment team.  Patients discharged the day of surgery will not be allowed to drive home.   Name and phone number of your driver:   family Special instructions:  Follow the diet and prep instructions given to you by Dr Roseanne Kaufman office.  Please read over the following fact sheets that you were given. Anesthesia Post-op Instructions and Care and Recovery After Surgery       Colonoscopy, Adult A colonoscopy is an exam to look at the large intestine. It is done to check for problems, such as:  Lumps (tumors).  Growths (polyps).  Swelling (inflammation).  Bleeding.  What happens before the procedure? Eating and drinking Follow instructions from your doctor about eating  and drinking. These instructions may include:  A few days before the procedure - follow a low-fiber diet. ? Avoid nuts. ? Avoid seeds. ? Avoid dried fruit. ? Avoid raw fruits. ? Avoid vegetables.  1-3 days before the procedure - follow a clear liquid diet. Avoid liquids that have red or purple dye. Drink only clear liquids, such as: ? Clear broth or bouillon. ? Black coffee or tea. ? Clear juice. ? Clear soft drinks or sports drinks. ? Gelatin dessert. ? Popsicles.  On the day of the procedure - do not eat or drink anything during the 2 hours before the procedure.  Bowel prep If you were prescribed an oral bowel prep:  Take it as told by your doctor. Starting the day before your procedure, you will need to drink a lot of liquid. The liquid will cause you to poop (have bowel movements) until your poop is almost clear or light green.  If your skin or butt gets irritated from diarrhea, you may: ? Wipe the area with wipes that have medicine in them, such as adult wet wipes with aloe and vitamin E. ? Put something on your skin that soothes the area, such as petroleum jelly.  If you throw up (vomit) while drinking the bowel prep, take a break for up to 60 minutes. Then begin the  bowel prep again. If you keep throwing up and you cannot take the bowel prep without throwing up, call your doctor.  General instructions  Ask your doctor about changing or stopping your normal medicines. This is important if you take diabetes medicines or blood thinners.  Plan to have someone take you home from the hospital or clinic. What happens during the procedure?  An IV tube may be put into one of your veins.  You will be given medicine to help you relax (sedative).  To reduce your risk of infection: ? Your doctors will wash their hands. ? Your anal area will be washed with soap.  You will be asked to lie on your side with your knees bent.  Your doctor will get a long, thin, flexible tube  ready. The tube will have a camera and a light on the end.  The tube will be put into your anus.  The tube will be gently put into your large intestine.  Air will be delivered into your large intestine to keep it open. You may feel some pressure or cramping.  The camera will be used to take photos.  A small tissue sample may be removed from your body to be looked at under a microscope (biopsy). If any possible problems are found, the tissue will be sent to a lab for testing.  If small growths are found, your doctor may remove them and have them checked for cancer.  The tube that was put into your anus will be slowly removed. The procedure may vary among doctors and hospitals. What happens after the procedure?  Your doctor will check on you often until the medicines you were given have worn off.  Do not drive for 24 hours after the procedure.  You may have a small amount of blood in your poop.  You may pass gas.  You may have mild cramps or bloating in your belly (abdomen).  It is up to you to get the results of your procedure. Ask your doctor, or the department performing the procedure, when your results will be ready. This information is not intended to replace advice given to you by your health care provider. Make sure you discuss any questions you have with your health care provider. Document Released: 08/29/2010 Document Revised: 05/27/2016 Document Reviewed: 10/08/2015 Elsevier Interactive Patient Education  2017 [employer]  Colonoscopy, Adult, Care After This sheet gives you information about how to care for yourself after your procedure. Your health care provider may also give you more specific instructions. If you have problems or questions, contact your health care provider. What can I expect after the procedure? After the procedure, it is common to have:  A small amount of blood in your stool for 24 hours after the procedure.  Some gas.  Mild abdominal cramping  or bloating.  Follow these instructions at home: General instructions   For the first 24 hours after the procedure: ? Do not drive or use machinery. ? Do not sign important documents. ? Do not drink alcohol. ? Do your regular daily activities at a slower pace than normal. ? Eat soft, easy-to-digest foods. ? Rest often.  Take over-the-counter or prescription medicines only as told by your health care provider.  It is up to you to get the results of your procedure. Ask your health care provider, or the department performing the procedure, when your results will be ready. Relieving cramping and bloating  Try walking around when you have cramps or feel  bloated.  Apply heat to your abdomen as told by your health care provider. Use a heat source that your health care provider recommends, such as a moist heat pack or a heating pad. ? Place a towel between your skin and the heat source. ? Leave the heat on for 20-30 minutes. ? Remove the heat if your skin turns bright red. This is especially important if you are unable to feel pain, heat, or cold. You may have a greater risk of getting burned. Eating and drinking  Drink enough fluid to keep your urine clear or pale yellow.  Resume your normal diet as instructed by your health care provider. Avoid heavy or fried foods that are hard to digest.  Avoid drinking alcohol for as long as instructed by your health care provider. Contact a health care provider if:  You have blood in your stool 2-3 days after the procedure. Get help right away if:  You have more than a small spotting of blood in your stool.  You pass large blood clots in your stool.  Your abdomen is swollen.  You have nausea or vomiting.  You have a fever.  You have increasing abdominal pain that is not relieved with medicine. This information is not intended to replace advice given to you by your health care provider. Make sure you discuss any questions you have with  your health care provider. Document Released: 03/10/2004 Document Revised: 04/20/2016 Document Reviewed: 10/08/2015 Elsevier Interactive Patient Education  2018 [place], Care After These instructions provide you with information about caring for yourself after your procedure. Your health care provider may also give you more specific instructions. Your treatment has been planned according to current medical practices, but problems sometimes occur. Call your health care provider if you have any problems or questions after your procedure. What can I expect after the procedure? After your procedure, it is common to:  Feel sleepy for several hours.  Feel clumsy and have poor balance for several hours.  Feel forgetful about what happened after the procedure.  Have poor judgment for several hours.  Feel nauseous or vomit.  Have a sore throat if you had a breathing tube during the procedure.  Follow these instructions at home: For at least 24 hours after the procedure:   Do not: ? Participate in activities in which you could fall or become injured. ? Drive. ? Use heavy machinery. ? Drink alcohol. ? Take sleeping pills or medicines that cause drowsiness. ? Make important decisions or sign legal documents. ? Take care of children on your own.  Rest. Eating and drinking  Follow the diet that is recommended by your health care provider.  If you vomit, drink water, juice, or soup when you can drink without vomiting.  Make sure you have little or no nausea before eating solid foods. General instructions  Have a responsible adult stay with you until you are awake and alert.  Take over-the-counter and prescription medicines only as told by your health care provider.  If you smoke, do not smoke without supervision.  Keep all follow-up visits as told by your health care provider. This is important. Contact a health care provider if:  You keep  feeling nauseous or you keep vomiting.  You feel light-headed.  You develop a rash.  You have a fever. Get help right away if:  You have trouble breathing. This information is not intended to replace advice given to you by your health care provider.  Make sure you discuss any questions you have with your health care provider. Document Released: 11/17/2015 Document Revised: 03/18/2016 Document Reviewed: 11/17/2015 Elsevier Interactive Patient Education  2018 [place], Care After These instructions provide you with information about caring for yourself after your procedure. Your health care provider may also give you more specific instructions. Your treatment has been planned according to current medical practices, but problems sometimes occur. Call your health care provider if you have any problems or questions after your procedure. What can I expect after the procedure? After your procedure, it is common to:  Feel sleepy for several hours.  Feel clumsy and have poor balance for several hours.  Feel forgetful about what happened after the procedure.  Have poor judgment for several hours.  Feel nauseous or vomit.  Have a sore throat if you had a breathing tube during the procedure.  Follow these instructions at home: For at least 24 hours after the procedure:   Do not: ? Participate in activities in which you could fall or become injured. ? Drive. ? Use heavy machinery. ? Drink alcohol. ? Take sleeping pills or medicines that cause drowsiness. ? Make important decisions or sign legal documents. ? Take care of children on your own.  Rest. Eating and drinking  Follow the diet that is recommended by your health care provider.  If you vomit, drink water, juice, or soup when you can drink without vomiting.  Make sure you have little or no nausea before eating solid foods. General instructions  Have a responsible adult stay with you until  you are awake and alert.  Take over-the-counter and prescription medicines only as told by your health care provider.  If you smoke, do not smoke without supervision.  Keep all follow-up visits as told by your health care provider. This is important. Contact a health care provider if:  You keep feeling nauseous or you keep vomiting.  You feel light-headed.  You develop a rash.  You have a fever. Get help right away if:  You have trouble breathing. This information is not intended to replace advice given to you by your health care provider. Make sure you discuss any questions you have with your health care provider. Document Released: 11/17/2015 Document Revised: 03/18/2016 Document Reviewed: 11/17/2015 Elsevier Interactive Patient Education  [employer].

## 2018-03-11 ENCOUNTER — Other Ambulatory Visit: Payer: Self-pay

## 2018-03-11 ENCOUNTER — Encounter (HOSPITAL_COMMUNITY)
Admission: RE | Admit: 2018-03-11 | Discharge: 2018-03-11 | Disposition: A | Discharge status: Home / Self Care | Payer: Medicare Other | Source: Ambulatory Visit | Attending: Internal Medicine | Admitting: Internal Medicine

## 2018-03-11 ENCOUNTER — Encounter (HOSPITAL_COMMUNITY): Payer: Self-pay

## 2018-03-11 DIAGNOSIS — Z01812 Encounter for preprocedural laboratory examination: Secondary | ICD-10-CM | POA: Insufficient documentation

## 2018-03-11 LAB — CBC WITH DIFFERENTIAL/PLATELET
Basophils Absolute: 0.1 10*3/uL (ref 0.0–0.1)
Basophils Relative: 1 %
Eosinophils Absolute: 0.2 10*3/uL (ref 0.0–0.7)
Eosinophils Relative: 3 %
HEMATOCRIT: 38.6 % (ref 36.0–46.0)
HEMOGLOBIN: 12.4 g/dL (ref 12.0–15.0)
LYMPHS ABS: 2.3 10*3/uL (ref 0.7–4.0)
Lymphocytes Relative: 28 %
MCH: 30.7 pg (ref 26.0–34.0)
MCHC: 32.1 g/dL (ref 30.0–36.0)
MCV: 95.5 fL (ref 78.0–100.0)
MONOS PCT: 15 %
Monocytes Absolute: 1.2 10*3/uL — ABNORMAL HIGH (ref 0.1–1.0)
NEUTROS ABS: 4.4 10*3/uL (ref 1.7–7.7)
NEUTROS PCT: 53 %
Platelets: 353 10*3/uL (ref 150–400)
RBC: 4.04 MIL/uL (ref 3.87–5.11)
RDW: 15 % (ref 11.5–15.5)
WBC: 8.3 10*3/uL (ref 4.0–10.5)

## 2018-03-11 LAB — BASIC METABOLIC PANEL
Anion gap: 7 (ref 5–15)
BUN: 16 mg/dL (ref 8–23)
CHLORIDE: 110 mmol/L (ref 98–111)
CO2: 25 mmol/L (ref 22–32)
Calcium: 8.8 mg/dL — ABNORMAL LOW (ref 8.9–10.3)
Creatinine, Ser: 0.71 mg/dL (ref 0.44–1.00)
GFR calc Af Amer: >60 mL/min (ref >60)
GFR calc non Af Amer: >60 mL/min (ref >60)
Glucose, Bld: 97 mg/dL (ref 70–99)
POTASSIUM: 3.9 mmol/L (ref 3.5–5.1)
Sodium: 142 mmol/L (ref 135–145)

## 2018-03-11 LAB — SURGICAL PCR SCREEN
MRSA, PCR: NEGATIVE
Staphylococcus aureus: NEGATIVE

## 2018-03-17 ENCOUNTER — Encounter (HOSPITAL_COMMUNITY)
Admission: RE | Disposition: A | Discharge status: Home / Self Care | Payer: Self-pay | Source: Ambulatory Visit | Attending: Internal Medicine

## 2018-03-17 ENCOUNTER — Telehealth: Payer: Self-pay | Admitting: Student

## 2018-03-17 ENCOUNTER — Ambulatory Visit (HOSPITAL_COMMUNITY): Payer: Medicare Other | Admitting: Anesthesiology

## 2018-03-17 ENCOUNTER — Ambulatory Visit (HOSPITAL_COMMUNITY)
Admission: RE | Admit: 2018-03-17 | Discharge: 2018-03-17 | Disposition: A | Discharge status: Home / Self Care | Payer: Medicare Other | Source: Ambulatory Visit | Attending: Internal Medicine | Admitting: Internal Medicine

## 2018-03-17 ENCOUNTER — Encounter (HOSPITAL_COMMUNITY): Payer: Self-pay | Admitting: *Deleted

## 2018-03-17 DIAGNOSIS — Z800 Family history of malignant neoplasm of digestive organs: Secondary | ICD-10-CM | POA: Diagnosis not present

## 2018-03-17 DIAGNOSIS — Z91038 Other insect allergy status: Secondary | ICD-10-CM | POA: Insufficient documentation

## 2018-03-17 DIAGNOSIS — Z86711 Personal history of pulmonary embolism: Secondary | ICD-10-CM | POA: Diagnosis not present

## 2018-03-17 DIAGNOSIS — Z8541 Personal history of malignant neoplasm of cervix uteri: Secondary | ICD-10-CM | POA: Insufficient documentation

## 2018-03-17 DIAGNOSIS — Z7901 Long term (current) use of anticoagulants: Secondary | ICD-10-CM | POA: Diagnosis not present

## 2018-03-17 DIAGNOSIS — Z881 Allergy status to other antibiotic agents status: Secondary | ICD-10-CM | POA: Insufficient documentation

## 2018-03-17 DIAGNOSIS — I69354 Hemiplegia and hemiparesis following cerebral infarction affecting left non-dominant side: Secondary | ICD-10-CM | POA: Insufficient documentation

## 2018-03-17 DIAGNOSIS — M1990 Unspecified osteoarthritis, unspecified site: Secondary | ICD-10-CM | POA: Diagnosis not present

## 2018-03-17 DIAGNOSIS — Z9119 Patient's noncompliance with other medical treatment and regimen: Secondary | ICD-10-CM | POA: Insufficient documentation

## 2018-03-17 DIAGNOSIS — I2510 Atherosclerotic heart disease of native coronary artery without angina pectoris: Secondary | ICD-10-CM | POA: Insufficient documentation

## 2018-03-17 DIAGNOSIS — I10 Essential (primary) hypertension: Secondary | ICD-10-CM | POA: Insufficient documentation

## 2018-03-17 DIAGNOSIS — G4730 Sleep apnea, unspecified: Secondary | ICD-10-CM | POA: Diagnosis not present

## 2018-03-17 DIAGNOSIS — Z888 Allergy status to other drugs, medicaments and biological substances status: Secondary | ICD-10-CM | POA: Insufficient documentation

## 2018-03-17 DIAGNOSIS — Z87891 Personal history of nicotine dependence: Secondary | ICD-10-CM | POA: Diagnosis not present

## 2018-03-17 DIAGNOSIS — Z79899 Other long term (current) drug therapy: Secondary | ICD-10-CM | POA: Insufficient documentation

## 2018-03-17 DIAGNOSIS — K6389 Other specified diseases of intestine: Secondary | ICD-10-CM | POA: Insufficient documentation

## 2018-03-17 DIAGNOSIS — Z1211 Encounter for screening for malignant neoplasm of colon: Secondary | ICD-10-CM | POA: Insufficient documentation

## 2018-03-17 DIAGNOSIS — I4891 Unspecified atrial fibrillation: Secondary | ICD-10-CM | POA: Insufficient documentation

## 2018-03-17 DIAGNOSIS — Z86010 Personal history of colonic polyps: Secondary | ICD-10-CM | POA: Diagnosis not present

## 2018-03-17 DIAGNOSIS — F419 Anxiety disorder, unspecified: Secondary | ICD-10-CM | POA: Diagnosis not present

## 2018-03-17 DIAGNOSIS — Q438 Other specified congenital malformations of intestine: Secondary | ICD-10-CM | POA: Insufficient documentation

## 2018-03-17 HISTORY — PX: COLONOSCOPY WITH PROPOFOL: SHX5780

## 2018-03-17 SURGERY — COLONOSCOPY WITH PROPOFOL
Anesthesia: General

## 2018-03-17 MED ORDER — LACTATED RINGERS IV SOLN
INTRAVENOUS | Status: DC
  Administered 2018-03-17: 10:00:00 via INTRAVENOUS

## 2018-03-17 MED ORDER — CHLORHEXIDINE GLUCONATE CLOTH 2 % EX PADS: 6.0000 | MEDICATED_PAD | Freq: Once | CUTANEOUS | Status: DC

## 2018-03-17 MED ORDER — PROPOFOL 10 MG/ML IV BOLUS
INTRAVENOUS | Status: DC | PRN
  Administered 2018-03-17: 20 mg via INTRAVENOUS

## 2018-03-17 MED ORDER — HYDROCODONE-ACETAMINOPHEN 7.5-325 MG PO TABS: 1.0000 | ORAL_TABLET | Freq: Once | ORAL | Status: DC | PRN

## 2018-03-17 MED ORDER — PROPOFOL 500 MG/50ML IV EMUL
INTRAVENOUS | Status: DC | PRN
  Administered 2018-03-17: 100 ug/kg/min via INTRAVENOUS

## 2018-03-17 MED ORDER — STERILE WATER FOR IRRIGATION IR SOLN
Status: DC | PRN
  Administered 2018-03-17: 100 mL

## 2018-03-17 MED ORDER — FENTANYL CITRATE (PF) 100 MCG/2ML IJ SOLN: 25.0000 ug | INTRAMUSCULAR | Status: DC | PRN

## 2018-03-17 NOTE — Transfer of Care (Signed)
Immediate Anesthesia Transfer of Care Note  Patient: Deborah Maddox  Procedure(s) Performed: COLONOSCOPY WITH PROPOFOL (N/A )  Patient Location: PACU  Anesthesia Type:General  Level of Consciousness: awake, alert  and oriented  Airway & Oxygen Therapy: Patient Spontanous Breathing  Post-op Assessment: Report given to RN  Post vital signs: Reviewed and stable  Last Vitals:  Vitals Value Taken Time  BP    Temp    Pulse    Resp    SpO2      Last Pain:  Vitals:   03/17/18 1036  TempSrc:   PainSc: 0-No pain      Patients Stated Pain Goal: 5 (85/46/27 0350)  Complications: No apparent anesthesia complications

## 2018-03-17 NOTE — Progress Notes (Signed)
Elon Alas RN Home Health notified of patient evaluated by Reynaldo Minium of [facility] and to avoid injections of Lovenox in right lower abdomen.

## 2018-03-17 NOTE — Progress Notes (Signed)
Edrick Oh RN with [facility] notified via telephone of Dr. Gala Romney request to have her Lovenox injection site on her abdomen assessed prior to discharge.  Lattie Haw ReidRN voiced that she is in [place] office and will need to contact [place] to come evaluate the site

## 2018-03-17 NOTE — Progress Notes (Signed)
[place] Nurse Elon Alas, RN at [phone] regarding patient can resume Coumadin and Lovenox as previously directed and that Dr. Gala Romney noted a lump in a previous injection site on right abdomen.

## 2018-03-17 NOTE — H&P (Signed)
@LOGO @   Primary Care Physician:  Megan Mans, NP Primary Gastroenterologist:  Dr. Gala Romney  Pre-Procedure History & Physical: HPI:  Deborah Maddox is a 62 y.o. female here for surveillance colonoscopy. History of colonic adenomas. Has been bridged with Lovenox. Coumadin held 4 days ago. Has significant ecchymosis and a bulge right lower quadrant. Patient denies recent fever or abdominal pain.  Past Medical History:  Diagnosis Date  . A-fib ([place])   . Anxiety   . Arthritis   . Cancer (H[facility])    cervical  . 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 Coronary artery disease   . Foot drop    left foot  . Hypertension   . Left foot drop   . 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 PONV (postoperative nausea and vomiting)   . Pulmonary emboli ([place]) 2014  . Seizures ([place])    last one 10 years ago  . Sleep apnea    Refuses to use machine because of recurrent pneumonia  . Stroke [facility])    at age 55, left sided weakness-complete paralysis of left arm.    Past Surgical History:  Procedure Laterality Date  . BREAST SURGERY    . CERVICAL CONE BIOPSY     cervical cancer  . COLONOSCOPY  2006   Dr. Anthony Sar: normal colonoscopy   . COLONOSCOPY WITH PROPOFOL N/A 10/06/2012   Dr. Gala Romney: prominent external hemorrhoids, colonic lipoma. hepatic flexure polyp, tubular adenoma.   Marland Kitchen DILATION AND CURETTAGE OF UTERUS    . LEG SURGERY    . LEG SURGERY     left  . OPEN REDUCTION INTERNAL FIXATION (ORIF) HAND     right hand and arm  . POLYPECTOMY N/A 10/06/2012   Procedure: POLYPECTOMY;  Surgeon: Daneil Dolin, MD;  Location: [facility];  Service: Endoscopy;  Laterality: N/A;  hepatic flexure polyp  . TONSILLECTOMY      Prior to Admission medications   Medication Sig Start Date End Date Taking? Authorizing Provider  atorvastatin (LIPITOR) 10 MG tablet Take 10 mg by mouth at bedtime.   Yes [provider]  Cholecalciferol (VITAMIN D) 2000 UNITS CAPS Take 2,000 Units by mouth  daily. At 1300   Yes [provider]  cyclobenzaprine (FLEXERIL) 10 MG tablet Take 10 mg by mouth 3 (three) times daily as needed (headaches).   Yes [provider]  diclofenac sodium (VOLTAREN) 1 % GEL Apply 2-4 g topically 3 (three) times daily. to knees   Yes [provider]  diltiazem (TIAZAC) 300 MG 24 hr capsule Take 300 mg by mouth daily.   Yes [provider]  docusate sodium (COLACE) 100 MG capsule Take 100 mg by mouth 2 (two) times daily.    Yes [provider]  DULoxetine (CYMBALTA) 60 MG capsule Take 60 mg by mouth daily.     Yes [provider]  enoxaparin (LOVENOX) 120 MG/0.8ML injection Inject 0.8 mLs (120 mg total) into the skin daily. At 8am 03/14/18  Yes Branch, Alphonse Guild, MD  fluticasone [facility]) 50 MCG/ACT nasal spray Place 2 sprays into both nostrils daily.   Yes [provider]  gabapentin (NEURONTIN) 300 MG capsule Take 300-900 mg by mouth See admin instructions. Take 300 mg by mouth every morning and evening and 900 mg at bedtime   Yes [provider]  Linaclotide (LINZESS) 290 MCG CAPS Take 1 capsule by mouth daily. 30 minutes before breakfast. 10/13/12  Yes Annitta Needs, NP  lisinopril (PRINIVIL,ZESTRIL) 40 MG tablet Take 40 mg by mouth daily.     Yes [provider]  loratadine (CLARITIN) 10 MG tablet Take 10 mg by mouth daily.   Yes [provider]  montelukast (SINGULAIR) 10 MG tablet Take 10 mg by mouth daily.   Yes [provider]  omeprazole (PRILOSEC) 20 MG capsule Take 20 mg by mouth daily.     Yes [provider]  oxybutynin (DITROPAN) 5 MG tablet Take 5 mg by mouth 2 (two) times daily.   Yes [provider]  polyethylene glycol-electrolytes (TRILYTE) 420 g solution Take 4,000 mLs by mouth as directed. 01/07/18  Yes Tiara Bartoli, [name], MD  senna (SENOKOT) 8.6 MG TABS Take 17.2 mg by mouth daily.    Yes [provider]  topiramate (TOPAMAX) 50 MG  tablet Take 50-100 mg by mouth See admin instructions. Take 50 mg in the morning and 100 mg at bedtime   Yes [provider]  vitamin B-12 (CYANOCOBALAMIN) 500 MCG tablet Take 500 mcg by mouth daily.   Yes [provider]  warfarin (COUMADIN) 2.5 MG tablet Take 1 tablet daily (2.5mg ) daily except 2 tablets (5mg ) on Mondays and Thursdays Patient taking differently: Take 1 tablet daily (2.5mg ) daily at 1600 except 2 tablets (5mg ) on Mondays and Thursdays 01/27/18  Yes Branch, Alphonse Guild, MD    Allergies as of 01/07/2018 - Review Complete 01/07/2018  Allergen Reaction Noted  . Vancomycin Hives and Itching 09/27/2017  . Latex Rash 03/26/2011  . Other Rash 03/26/2011    Family History  Problem Relation Age of Onset  . Diabetes Mother   . Liver cancer Mother   . Diabetes Sister   . Cancer Brother        "all over"   . Cancer Other        uncle  . Diabetes Other   . Colon cancer Unknown        unsure  . Mesothelioma Father     Social History   Socioeconomic History  . Marital status: Single    Spouse name: Not on file  . Number of children: Not on file  . Years of education: Not on file  . Highest education level: Not on file  Occupational History  . Not on file  Social Needs  . Financial resource strain: Not on file  . Food insecurity:    Worry: Not on file    Inability: Not on file  . Transportation needs:    Medical: Not on file    Non-medical: Not on file  Tobacco Use  . Smoking status: Former Smoker    Years: 5.00    Last attempt to quit: 03/25/1981    Years since quitting: 37.0  . Smokeless tobacco: Never Used  Substance and Sexual Activity  . Alcohol use: No  . Drug use: No  . Sexual activity: Never    Birth control/protection: Post-menopausal  Lifestyle  . Physical activity:    Days per week: Not on file    Minutes per session: Not on file  . Stress: Not on file  Relationships  . Social connections:    Talks on phone: Not on file     Gets together: Not on file    Attends religious service: Not on file    Active member of club or organization: Not on file    Attends meetings of clubs or organizations: Not on file    Relationship  status: Not on file  . Intimate partner violence:    Fear of current or ex partner: Not on file    Emotionally abused: Not on file    Physically abused: Not on file    Forced sexual activity: Not on file  Other Topics Concern  . Not on file  Social History Narrative  . Not on file    Review of Systems: See HPI, otherwise negative ROS  Physical Exam: BP (!) 137/93   Pulse 89   Temp 97.8 F (36.6 C) (Oral)   Resp (!) 27   SpO2 98%  General:   Alert,  somewhat chronically ill appearing pleasant and cooperative in NAD Neck:  Supple; no masses or thyromegaly. No significant cervical adenopathy. Lungs:  Clear throughout to auscultation.   No wheezes, crackles, or rhonchi. No acute distress. Heart:  Regular rate and rhythm; no murmurs, clicks, rubs,  or gallops. Abdomen: Obese. Multiple ecchymotic areas. Right lower quadrant there is a rather large area of subcutaneous fullness palpable between 2 ecchymotic areas. Entirely nontender. Nonfluctuant. Pulses:  Normal pulses noted. Extremities:  Without clubbing or edema.  Impression/Plan:  Pleasant 62 year old lady with history of colonic adenoma; here for surveillance colonoscopy. Coumadin held, Lovenox bridging. Mass effect right lower quadrant most likely related to a hematoma. Doubt cellulitis. Should not affect proceeding with colonoscopy. The risks, benefits, limitations, alternatives and imponderables have been reviewed with the patient. Questions have been answered. All parties are agreeable.   Will have patient stop by the Coumadin clinic once she is done in the endoscopy department this morning.  She should stay away from abnormal area with future injections.    Notice: This dictation was prepared with Dragon dictation along with  smaller phrase technology. Any transcriptional errors that result from this process are unintentional and may not be corrected upon review.

## 2018-03-17 NOTE — Anesthesia Postprocedure Evaluation (Signed)
Anesthesia Post Note  Patient: Deborah Maddox  Procedure(s) Performed: COLONOSCOPY WITH PROPOFOL (N/A )  Patient location during evaluation: PACU Anesthesia Type: General Level of consciousness: awake and alert and oriented Pain management: pain level controlled Vital Signs Assessment: post-procedure vital signs reviewed and stable Respiratory status: spontaneous breathing Cardiovascular status: stable and blood pressure returned to baseline Postop Assessment: no apparent nausea or vomiting Anesthetic complications: no     Last Vitals:  Vitals:   03/17/18 0945 03/17/18 1043  BP:  128/79  Pulse:    Resp:  18  Temp:  36.6 C  SpO2: 98% 96%    Last Pain:  Vitals:   03/17/18 1043  TempSrc:   PainSc: 0-No pain                 Hussien Greenblatt

## 2018-03-17 NOTE — Anesthesia Preprocedure Evaluation (Signed)
Anesthesia Evaluation  Patient identified by MRN, date of birth, ID band Patient awake    Reviewed: Allergy & Precautions, NPO status , Patient's Chart, lab work & pertinent test results  History of Anesthesia Complications (+) PONV  Airway Mallampati: II  TM Distance: >3 FB Neck ROM: Full    Dental no notable dental hx.    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 on medications + CAD  negative cardio ROS Normal cardiovascular exam+ dysrhythmias Atrial Fibrillation II Rhythm:Irregular Rate:Abnormal  Denies ever being cardioverted  Unable to tell me duration of Afib  Denies recent CP   Neuro/Psych Seizures -, Well Controlled,  Anxiety Pt very poor historian  Unable to give very meaningful history  States has L sided weakness from CVA- uses cane /WC Very sedentary  Denies O2 use at home  States No Sz activity in over 5 years  CVA negative neurological ROS  negative psych ROS   GI/Hepatic negative GI ROS, Neg liver ROS, GERD  Medicated and Controlled,Denies any GERD Sx today or while on meds    Endo/Other  negative endocrine ROS  Renal/GU negative Renal ROS  negative genitourinary   Musculoskeletal negative musculoskeletal ROS (+) Arthritis , Osteoarthritis,    Abdominal   Peds negative pediatric ROS (+)  Hematology negative hematology ROS (+)   Anesthesia Other Findings SMO Pt with an area of induration on abdomen- d/w Dr. Gala Romney -states he will look at prior to starting procedure   Reproductive/Obstetrics negative OB ROS                             Anesthesia Physical Anesthesia Plan  ASA: IV  Anesthesia Plan: General   Post-op Pain  Management:    Induction: Intravenous  PONV Risk Score and Plan:   Airway Management Planned: Simple Face Mask  Additional Equipment:   Intra-op Plan:   Post-operative Plan:   Informed Consent:   Dental advisory given  Plan Discussed with:   Anesthesia Plan Comments:         Anesthesia Quick Evaluation

## 2018-03-17 NOTE — Op Note (Signed)
[facility] Patient Name: Deborah Maddox Procedure Date: 03/17/2018 9:51 AM MRN: [id number] Date of Birth: [birth year] Attending MD: Norvel Richards , MD CSN: [id number] Age: 62 Admit Type: Outpatient Procedure:                Colonoscopy Indications:              High risk colon cancer surveillance: Personal                            history of colonic polyps Providers:                Norvel Richards, MD, Rosina Lowenstein, RN, Randa Spike, Technician Referring MD:              Medicines:                Propofol per Anesthesia Complications:            No immediate complications. Estimated Blood Loss:     Estimated blood loss: none. Procedure:                Pre-Anesthesia Assessment:                           - Prior to the procedure, a History and Physical                            was performed, and patient medications and                            allergies were reviewed. The patient's tolerance of                            previous anesthesia was also reviewed. The risks                            and benefits of the procedure and the sedation                            options and risks were discussed with the patient.                            All questions were answered, and informed consent                            was obtained. Prior Anticoagulants: The patient                            last took Coumadin (warfarin) 4 days prior to the                            procedure. ASA Grade Assessment: III - A patient  with severe systemic disease. After reviewing the                            risks and benefits, the patient was deemed in                            satisfactory condition to undergo the procedure.                           After obtaining informed consent, the colonoscope                            was passed under direct vision. Throughout the                            procedure, the patient's  blood pressure, pulse, and                            oxygen saturations were monitored continuously. The                            CF-[id number] ([id number]) scope was introduced through                            the and advanced to the the cecum, identified by                            appendiceal orifice and ileocecal valve. The                            colonoscopy was performed without difficulty. The                            patient tolerated the procedure well. The quality                            of the bowel preparation was adequate. The                            ileocecal valve, appendiceal orifice, and rectum                            were photographed. The entire colon was well                            visualized. Scope In: 10:15:04 AM Scope Out: 10:35:15 AM Scope Withdrawal Time: 0 hours 8 minutes 43 seconds  Total Procedure Duration: 0 hours 20 minutes 11 seconds  Findings:      The perianal and digital rectal examinations were normal. redundant       colon. External abdominal pressure required to reach the cecum.      A diffuse area of moderate melanosis was found in the entire colon.      The exam was otherwise without abnormality on direct and retroflexion  views. Impression:               - Melanosis in the colon. Redundant colon.                           - The examination was otherwise normal on direct                            and retroflexion views.                           - No specimens collected. Moderate Sedation:      Moderate (conscious) sedation was personally administered by an       anesthesia professional. The following parameters were monitored: oxygen       saturation, heart rate, blood pressure, respiratory rate, EKG, adequacy       of pulmonary ventilation, and response to care. Total physician       intraservice time was 25 minutes. Recommendation:           - Patient has a contact number available for                             emergencies. The signs and symptoms of potential                            delayed complications were discussed with the                            patient. Return to normal activities tomorrow.                            Written discharge instructions were provided to the                            patient.                           - Advance diet as tolerated.                           - Continue present medications. Resume Coumadin                            today. Resume Lovenox today and continue until                            directed otherwise by the Coumadin clinic. See the                            Coumadin clinic today regarding the lump in the                            right side of your abdomen.                           - Repeat colonoscopy in 5 years  for surveillance.                           - Return to GI office PRN. Procedure Code(s):        --- Professional ---                           [phone], Colonoscopy, flexible; diagnostic, including                            collection of specimen(s) by brushing or washing,                            when performed (separate procedure) Diagnosis Code(s):        --- Professional ---                           Z86.010, Personal history of colonic polyps                           K63.89, Other specified diseases of intestine CPT copyright 2017 American Medical Association. All rights reserved. The codes documented in this report are preliminary and upon coder review may  be revised to meet current compliance requirements. [name]. [name], MD Norvel Richards, MD 03/17/2018 10:47:49 AM This report has been signed electronically. Number of Addenda: 0

## 2018-03-17 NOTE — Discharge Instructions (Signed)
Colonoscopy Discharge Instructions  Read the instructions outlined below and refer to this sheet in the next few weeks. These discharge instructions provide you with general information on caring for yourself after you leave the hospital. Your doctor may also give you specific instructions. While your treatment has been planned according to the most current medical practices available, unavoidable complications occasionally occur. If you have any problems or questions after discharge, call Dr. Gala Romney at [phone]. ACTIVITY  You may resume your regular activity, but move at a slower pace for the next 24 hours.   Take frequent rest periods for the next 24 hours.   Walking will help get rid of the air and reduce the bloated feeling in your belly (abdomen).   No driving for 24 hours (because of the medicine (anesthesia) used during the test).    Do not sign any important legal documents or operate any machinery for 24 hours (because of the anesthesia used during the test).  NUTRITION  Drink plenty of fluids.   You may resume your normal diet as instructed by your doctor.   Begin with a light meal and progress to your normal diet. Heavy or fried foods are harder to digest and may make you feel sick to your stomach (nauseated).   Avoid alcoholic beverages for 24 hours or as instructed.  MEDICATIONS  You may resume your normal medications unless your doctor tells you otherwise.  WHAT YOU CAN EXPECT TODAY  Some feelings of bloating in the abdomen.   Passage of more gas than usual.   Spotting of blood in your stool or on the toilet paper.  IF YOU HAD POLYPS REMOVED DURING THE COLONOSCOPY:  No aspirin products for 7 days or as instructed.   No alcohol for 7 days or as instructed.   Eat a soft diet for the next 24 hours.  FINDING OUT THE RESULTS OF YOUR TEST Not all test results are available during your visit. If your test results are not back during the visit, make an appointment  with your caregiver to find out the results. Do not assume everything is normal if you have not heard from your caregiver or the medical facility. It is important for you to follow up on all of your test results.  SEEK IMMEDIATE MEDICAL ATTENTION IF:  You have more than a spotting of blood in your stool.   Your belly is swollen (abdominal distention).   You are nauseated or vomiting.   You have a temperature over 101.   You have abdominal pain or discomfort that is severe or gets worse throughout the day.    Repeat colonoscopy in 5 years  Resume Coumadin today.  Continue Lovenox until directed otherwise by the Coumadin clinic.  See the Coumadin clinic today before you leave the hospital regarding the lump in the right side of your abdomen (do not inject Lovenox anywhere near the abnormal area in the right side of your abdomen)     Monitored Anesthesia Care, Care After These instructions provide you with information about caring for yourself after your procedure. Your health care provider may also give you more specific instructions. Your treatment has been planned according to current medical practices, but problems sometimes occur. Call your health care provider if you have any problems or questions after your procedure. What can I expect after the procedure? After your procedure, it is common to:  Feel sleepy for several hours.  Feel clumsy and have poor balance for several hours.  Feel  forgetful about what happened after the procedure.  Have poor judgment for several hours.  Feel nauseous or vomit.  Have a sore throat if you had a breathing tube during the procedure.  Follow these instructions at home: For at least 24 hours after the procedure:   Do not: ? Participate in activities in which you could fall or become injured. ? Drive. ? Use heavy machinery. ? Drink alcohol. ? Take sleeping pills or medicines that cause drowsiness. ? Make important decisions or  sign legal documents. ? Take care of children on your own.  Rest. Eating and drinking  Follow the diet that is recommended by your health care provider.  If you vomit, drink water, juice, or soup when you can drink without vomiting.  Make sure you have little or no nausea before eating solid foods. General instructions  Have a responsible adult stay with you until you are awake and alert.  Take over-the-counter and prescription medicines only as told by your health care provider.  If you smoke, do not smoke without supervision.  Keep all follow-up visits as told by your health care provider. This is important. Contact a health care provider if:  You keep feeling nauseous or you keep vomiting.  You feel light-headed.  You develop a rash.  You have a fever. Get help right away if:  You have trouble breathing. This information is not intended to replace advice given to you by your health care provider. Make sure you discuss any questions you have with your health care provider. Document Released: 11/17/2015 Document Revised: 03/18/2016 Document Reviewed: 11/17/2015 Elsevier Interactive Patient Education  [employer].

## 2018-03-17 NOTE — Progress Notes (Signed)
Deborah Maddox in to evaluate patient injection sites.  Patient denies pain.  Per Bernerd Pho PA, Lovenox may be resumed at current prescribed intervals, no injections to be given to right abdomen site.

## 2018-03-17 NOTE — Telephone Encounter (Signed)
    Contacted by [facility]in Clinic to evaluate hematoma site following Lovenox injections. Met the patient in Short Stay and she does have a well-formed hematoma along the right abdominal wall. This appears firm and she is not having any discomfort at the site. Agree with Dr. Gala Romney that we should avoid any further injections into that area. She is being followed by Home Health while at [facility]. This area will need to be followed for if it increases in size or she develops worsening discomfort, may need to hold Lovenox injections and pursue further evaluation with US imaging.    Signed, Erma Heritage, PA-C 03/17/2018, 12:36 PM Pager: [phone]

## 2018-03-18 ENCOUNTER — Telehealth: Payer: Self-pay | Admitting: Student

## 2018-03-18 NOTE — Telephone Encounter (Signed)
    Contacted by [place] that the patient now has a new hematoma along her left abdominal wall where she received her most recent Lovenox injection. She did resume Coumadin last night following her colonoscopy. Given her increasing hematomas, will plan to stop Lovenox injections at this time and continue on Coumadin. This was reviewed with Dr. Harl Bowie who was in agreement.  She is scheduled for an INR check next week.  Signed, Erma Heritage, PA-C 03/18/2018, 9:55 AM Pager: [phone]

## 2018-03-22 ENCOUNTER — Encounter (HOSPITAL_COMMUNITY): Payer: Self-pay | Admitting: Internal Medicine

## 2018-03-23 ENCOUNTER — Ambulatory Visit (INDEPENDENT_AMBULATORY_CARE_PROVIDER_SITE_OTHER): Payer: Medicare Other | Admitting: *Deleted

## 2018-03-23 DIAGNOSIS — Z5181 Encounter for therapeutic drug level monitoring: Secondary | ICD-10-CM | POA: Diagnosis not present

## 2018-03-23 DIAGNOSIS — I4891 Unspecified atrial fibrillation: Secondary | ICD-10-CM

## 2018-03-23 DIAGNOSIS — I2699 Other pulmonary embolism without acute cor pulmonale: Secondary | ICD-10-CM

## 2018-03-23 LAB — POCT INR: INR: 3.3 — AB (ref 2.0–3.0)

## 2018-03-23 NOTE — Patient Instructions (Signed)
Hold coumadin tonight then resume 1 tablet  (2.5mg ) daily except 2 tablets (5mg )  on Mondays and Thursdays Recheck in 4 weeks

## 2018-04-11 ENCOUNTER — Encounter (HOSPITAL_COMMUNITY): Payer: Self-pay

## 2018-04-11 ENCOUNTER — Inpatient Hospital Stay (HOSPITAL_COMMUNITY)
Admission: EM | Admit: 2018-04-11 | Discharge: 2018-04-14 | DRG: 689 | Disposition: A | Discharge status: Home Health | Payer: Medicare Other | Source: Skilled Nursing Facility | Attending: Internal Medicine | Admitting: Internal Medicine

## 2018-04-11 ENCOUNTER — Other Ambulatory Visit: Payer: Self-pay

## 2018-04-11 ENCOUNTER — Emergency Department (HOSPITAL_COMMUNITY): Payer: Medicare Other

## 2018-04-11 DIAGNOSIS — E785 Hyperlipidemia, unspecified: Secondary | ICD-10-CM | POA: Diagnosis present

## 2018-04-11 DIAGNOSIS — I10 Essential (primary) hypertension: Secondary | ICD-10-CM | POA: Diagnosis present

## 2018-04-11 DIAGNOSIS — N390 Urinary tract infection, site not specified: Secondary | ICD-10-CM | POA: Diagnosis not present

## 2018-04-11 DIAGNOSIS — Z9109 Other allergy status, other than to drugs and biological substances: Secondary | ICD-10-CM

## 2018-04-11 DIAGNOSIS — A419 Sepsis, unspecified organism: Secondary | ICD-10-CM | POA: Diagnosis present

## 2018-04-11 DIAGNOSIS — Z881 Allergy status to other antibiotic agents status: Secondary | ICD-10-CM

## 2018-04-11 DIAGNOSIS — Z79899 Other long term (current) drug therapy: Secondary | ICD-10-CM

## 2018-04-11 DIAGNOSIS — D649 Anemia, unspecified: Secondary | ICD-10-CM | POA: Diagnosis present

## 2018-04-11 DIAGNOSIS — Z87891 Personal history of nicotine dependence: Secondary | ICD-10-CM

## 2018-04-11 DIAGNOSIS — R791 Abnormal coagulation profile: Secondary | ICD-10-CM

## 2018-04-11 DIAGNOSIS — Z9119 Patient's noncompliance with other medical treatment and regimen: Secondary | ICD-10-CM

## 2018-04-11 DIAGNOSIS — Z6841 Body Mass Index (BMI) 40.0 and over, adult: Secondary | ICD-10-CM

## 2018-04-11 DIAGNOSIS — R569 Unspecified convulsions: Secondary | ICD-10-CM | POA: Diagnosis present

## 2018-04-11 DIAGNOSIS — R4182 Altered mental status, unspecified: Secondary | ICD-10-CM | POA: Diagnosis present

## 2018-04-11 DIAGNOSIS — G8324 Monoplegia of upper limb affecting left nondominant side: Secondary | ICD-10-CM | POA: Diagnosis present

## 2018-04-11 DIAGNOSIS — I4891 Unspecified atrial fibrillation: Secondary | ICD-10-CM | POA: Diagnosis present

## 2018-04-11 DIAGNOSIS — I959 Hypotension, unspecified: Secondary | ICD-10-CM | POA: Diagnosis present

## 2018-04-11 DIAGNOSIS — G92 Toxic encephalopathy: Secondary | ICD-10-CM | POA: Diagnosis present

## 2018-04-11 DIAGNOSIS — R531 Weakness: Secondary | ICD-10-CM | POA: Diagnosis not present

## 2018-04-11 DIAGNOSIS — K219 Gastro-esophageal reflux disease without esophagitis: Secondary | ICD-10-CM | POA: Diagnosis present

## 2018-04-11 DIAGNOSIS — Z91040 Latex allergy status: Secondary | ICD-10-CM

## 2018-04-11 DIAGNOSIS — I2510 Atherosclerotic heart disease of native coronary artery without angina pectoris: Secondary | ICD-10-CM | POA: Diagnosis present

## 2018-04-11 DIAGNOSIS — G4730 Sleep apnea, unspecified: Secondary | ICD-10-CM | POA: Diagnosis present

## 2018-04-11 DIAGNOSIS — G4733 Obstructive sleep apnea (adult) (pediatric): Secondary | ICD-10-CM | POA: Diagnosis present

## 2018-04-11 DIAGNOSIS — I119 Hypertensive heart disease without heart failure: Secondary | ICD-10-CM | POA: Diagnosis present

## 2018-04-11 DIAGNOSIS — F329 Major depressive disorder, single episode, unspecified: Secondary | ICD-10-CM | POA: Diagnosis present

## 2018-04-11 DIAGNOSIS — Z7901 Long term (current) use of anticoagulants: Secondary | ICD-10-CM

## 2018-04-11 LAB — COMPREHENSIVE METABOLIC PANEL
ALK PHOS: 71 U/L (ref 38–126)
ALT: 11 U/L (ref 0–44)
ANION GAP: 5 (ref 5–15)
AST: 13 U/L — ABNORMAL LOW (ref 15–41)
Albumin: 3.2 g/dL — ABNORMAL LOW (ref 3.5–5.0)
BILIRUBIN TOTAL: 0.5 mg/dL (ref 0.3–1.2)
BUN: 17 mg/dL (ref 8–23)
CALCIUM: 8 mg/dL — AB (ref 8.9–10.3)
CO2: 22 mmol/L (ref 22–32)
Chloride: 114 mmol/L — ABNORMAL HIGH (ref 98–111)
Creatinine, Ser: 0.75 mg/dL (ref 0.44–1.00)
Glucose, Bld: 135 mg/dL — ABNORMAL HIGH (ref 70–99)
Potassium: 3.5 mmol/L (ref 3.5–5.1)
Sodium: 141 mmol/L (ref 135–145)
TOTAL PROTEIN: 6.1 g/dL — AB (ref 6.5–8.1)

## 2018-04-11 LAB — URINALYSIS, ROUTINE W REFLEX MICROSCOPIC
Bilirubin Urine: NEGATIVE
Glucose, UA: NEGATIVE mg/dL
KETONES UR: NEGATIVE mg/dL
NITRITE: NEGATIVE
PROTEIN: NEGATIVE mg/dL
RBC / HPF: >50 RBC/hpf — ABNORMAL HIGH (ref 0–5)
Specific Gravity, Urine: 1.008 (ref 1.005–1.030)
pH: 5 (ref 5.0–8.0)

## 2018-04-11 LAB — CBC WITH DIFFERENTIAL/PLATELET
BASOS ABS: 0.1 10*3/uL (ref 0.0–0.1)
BASOS PCT: 1 %
Eosinophils Absolute: 0.3 10*3/uL (ref 0.0–0.7)
Eosinophils Relative: 3 %
HEMATOCRIT: 33.2 % — AB (ref 36.0–46.0)
Hemoglobin: 10.4 g/dL — ABNORMAL LOW (ref 12.0–15.0)
Lymphocytes Relative: 38 %
Lymphs Abs: 3.3 10*3/uL (ref 0.7–4.0)
MCH: 30.1 pg (ref 26.0–34.0)
MCHC: 31.3 g/dL (ref 30.0–36.0)
MCV: 96.2 fL (ref 78.0–100.0)
Monocytes Absolute: 1.2 10*3/uL — ABNORMAL HIGH (ref 0.1–1.0)
Monocytes Relative: 14 %
NEUTROS ABS: 3.8 10*3/uL (ref 1.7–7.7)
Neutrophils Relative %: 44 %
Platelets: 299 10*3/uL (ref 150–400)
RBC: 3.45 MIL/uL — AB (ref 3.87–5.11)
RDW: 15.4 % (ref 11.5–15.5)
WBC: 8.5 10*3/uL (ref 4.0–10.5)

## 2018-04-11 LAB — I-STAT CG4 LACTIC ACID, ED: Lactic Acid, Venous: 0.75 mmol/L (ref 0.5–1.9)

## 2018-04-11 MED ORDER — SODIUM CHLORIDE 0.9 % IV SOLN
1.0000 g | INTRAVENOUS | Status: DC
  Administered 2018-04-11: 1 g via INTRAVENOUS
  Filled 2018-04-11: qty 10

## 2018-04-11 NOTE — ED Notes (Signed)
Per EMS glucose 129.

## 2018-04-11 NOTE — ED Provider Notes (Signed)
[facility] EMERGENCY DEPARTMENT Provider Note   CSN: [id number] Arrival date & time: 04/11/18  2116     History   Chief Complaint Chief Complaint  Patient presents with  . Weakness    HPI Deborah Maddox is a 62 y.o. female.  HPI   She presents from a nursing care facility for evaluation of altered mental status.  Patient states that she is here for "dizziness." Symptoms started this evening.  She denies fever, chills, cough, focal weakness or paresthesia.  She states she has had chronic left leg swelling greater than right for 3 years.  She denies change in bowel or urinary habits.  She is taking her usual medications.  She was transferred by EMS and during transfer received almost 500 cc of IV fluid.  Patient arrived with borderline low blood pressure, which dropped after initial evaluation in the ED.  Patient at this time, currently in Trendelenburg, conversant and cooperative.  Repeat blood pressure at that time greater than 90 systolic.  There are no other known modifying factors.  Past Medical History:  Diagnosis Date  . A-fib ([place])   . Anxiety   . Arthritis   . Cancer (HCC)    cervical  . 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 Coronary artery disease   . Foot drop    left foot  . Hypertension   . Left foot drop   . 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 PONV (postoperative nausea and vomiting)   . Pulmonary emboli ([place]) 2014  . Seizures ([place])    last one 10 years ago  . Sleep apnea    Refuses to use machine because of recurrent pneumonia  . Stroke [facility])    at age 62, left sided weakness-complete paralysis of left arm.    Patient Active Problem List   Diagnosis Date Noted  . H/O adenomatous polyp of colon 01/07/2018  . Lower back pain 09/28/2017  . Seizures ([place]) 09/28/2017  . MRSA (methicillin resistant staph aureus) positive 09/28/2017  . Cellulitis of abdominal wall 09/27/2017  . Sleep apnea 09/27/2017  . Anxiety 09/27/2017  . Coronary  artery disease 09/27/2017  . Foot drop 06/01/2017  . Encounter for therapeutic drug monitoring 09/14/2013  . Long term (current) use of anticoagulants 12/14/2012  . Atrial fibrillation ([place]) 12/14/2012  . Acute pulmonary embolism ([place]) 12/05/2012  . Atrial flutter ([place]) 12/05/2012  . Constipation 09/28/2012  . Rectal bleeding 09/28/2012  . Heart palpitations 02/01/2012  . Hypertension 02/01/2012  . Chest pain at rest 02/01/2012    Past Surgical History:  Procedure Laterality Date  . BREAST SURGERY    . CERVICAL CONE BIOPSY     cervical cancer  . COLONOSCOPY  2006   Dr. Anthony Sar: normal colonoscopy   . COLONOSCOPY WITH PROPOFOL N/A 10/06/2012   Dr. Gala Romney: prominent external hemorrhoids, colonic lipoma. hepatic flexure polyp, tubular adenoma.   . COLONOSCOPY WITH PROPOFOL N/A 03/17/2018   Procedure: COLONOSCOPY WITH PROPOFOL;  Surgeon: Daneil Dolin, MD;  Location: [facility] ENDO SUITE;  Service: Endoscopy;  Laterality: N/A;  10:00am  . DILATION AND CURETTAGE OF UTERUS    . LEG SURGERY    . LEG SURGERY     left  . OPEN REDUCTION INTERNAL FIXATION (ORIF) HAND     right hand and arm  . POLYPECTOMY N/A 10/06/2012   Procedure: POLYPECTOMY;  Surgeon: Daneil Dolin, MD;  Location: [facility] ORS;  Service: Endoscopy;  Laterality: N/A;  hepatic flexure polyp  . TONSILLECTOMY       OB History    Gravida  3   Para  2   Term  2   Preterm      AB  1   Living        SAB  1   TAB      Ectopic      Multiple      Live Births               Home Medications    Prior to Admission medications   Medication Sig Start Date End Date Taking? Authorizing Provider  atorvastatin (LIPITOR) 10 MG tablet Take 10 mg by mouth at bedtime.   Yes [provider]  Cholecalciferol (VITAMIN D) 2000 UNITS CAPS Take 2,000 Units by mouth daily. At 1300   Yes [provider]  diltiazem (TIAZAC) 300 MG 24 hr capsule Take 300 mg by mouth daily.   Yes [provider]  docusate  sodium (COLACE) 100 MG capsule Take 100 mg by mouth 2 (two) times daily.    Yes [provider]  DULoxetine (CYMBALTA) 60 MG capsule Take 60 mg by mouth daily.     Yes [provider]  fluticasone (FLONASE) 50 MCG/ACT nasal spray Place 2 sprays into both nostrils daily.   Yes [provider]  gabapentin (NEURONTIN) 300 MG capsule Take 300-900 mg by mouth See admin instructions. Take 300 mg by mouth every morning and evening and 900 mg at bedtime   Yes [provider]  Linaclotide (LINZESS) 290 MCG CAPS Take 1 capsule by mouth daily. 30 minutes before breakfast. 10/13/12  Yes Annitta Needs, NP  lisinopril (PRINIVIL,ZESTRIL) 40 MG tablet Take 40 mg by mouth daily.     Yes [provider]  loratadine (CLARITIN) 10 MG tablet Take 10 mg by mouth daily.   Yes [provider]  montelukast (SINGULAIR) 10 MG tablet Take 10 mg by mouth daily.   Yes [provider]  omeprazole (PRILOSEC) 20 MG capsule Take 20 mg by mouth daily.     Yes [provider]  oxybutynin (DITROPAN) 5 MG tablet Take 5 mg by mouth 2 (two) times daily.   Yes [provider]  senna (SENOKOT) 8.6 MG TABS Take 17.2 mg by mouth daily.    Yes [provider]  topiramate (TOPAMAX) 50 MG tablet Take 50-100 mg by mouth See admin instructions. Take 50 mg in the morning and 100 mg at bedtime   Yes [provider]  vitamin B-12 (CYANOCOBALAMIN) 500 MCG tablet Take 500 mcg by mouth daily.   Yes [provider]  warfarin (COUMADIN) 2.5 MG tablet Take 1 tablet daily (2.5mg ) daily except 2 tablets (5mg ) on Mondays and Thursdays Patient taking differently: Take 1 tablet daily (2.5mg ) daily at 1600 except 2 tablets (5mg ) on Mondays and Thursdays 01/27/18  Yes Branch, Alphonse Guild, MD  cyclobenzaprine (FLEXERIL) 10 MG tablet Take 10 mg by mouth 3 (three) times daily as needed (headaches).    [provider]  diclofenac sodium (VOLTAREN) 1 % GEL  Apply 2-4 g topically 3 (three) times daily. to knees    [provider]  enoxaparin (LOVENOX) 120 MG/0.8ML injection Inject 0.8 mLs (120 mg total) into the skin daily. At 8am 03/14/18   Arnoldo Lenis, MD  polyethylene glycol-electrolytes (TRILYTE) 420 g solution Take 4,000 mLs by mouth as directed. 01/07/18   Rourk, [name], MD  Family History Family History  Problem Relation Age of Onset  . Diabetes Mother   . Liver cancer Mother   . Diabetes Sister   . Cancer Brother        "all over"   . Cancer Other        uncle  . Diabetes Other   . Colon cancer Unknown        unsure  . Mesothelioma Father     Social History Social History   Tobacco Use  . Smoking status: Former Smoker    Years: 5.00    Last attempt to quit: 03/25/1981    Years since quitting: 37.0  . Smokeless tobacco: Never Used  Substance Use Topics  . Alcohol use: No  . Drug use: No     Allergies   Vancomycin; Latex; and Other   Review of Systems Review of Systems  All other systems reviewed and are negative.    Physical Exam Updated Vital Signs BP (!) 85/58   Pulse 73   Temp (!) 96 F (35.6 C)   Resp 15   Ht 5\' 3"  (1.6 m)   Wt 125.6 kg   SpO2 96%   BMI 49.07 kg/m   Physical Exam  Constitutional: She is oriented to person, place, and time. She appears well-developed and well-nourished.  HENT:  Head: Normocephalic and atraumatic.  Eyes: Pupils are equal, round, and reactive to light. Conjunctivae and EOM are normal.  Neck: Normal range of motion and phonation normal. Neck supple.  Cardiovascular: Normal rate and regular rhythm.  Pulmonary/Chest: Effort normal and breath sounds normal. She exhibits no tenderness.  Abdominal: Soft. She exhibits no distension. There is no tenderness. There is no guarding.  Musculoskeletal: Normal range of motion.  Neurological: She is alert and oriented to person, place, and time. She exhibits normal muscle tone.  Skin: Skin is warm and dry.    Psychiatric: She has a normal mood and affect. Her behavior is normal. Judgment and thought content normal.  Nursing note and vitals reviewed.    ED Treatments / Results  Labs (all labs ordered are listed, but only abnormal results are displayed) Labs Reviewed  COMPREHENSIVE METABOLIC PANEL - Abnormal; Notable for the following components:      Result Value   Chloride 114 (*)    Glucose, Bld 135 (*)    Calcium 8.0 (*)    Total Protein 6.1 (*)    Albumin 3.2 (*)    AST 13 (*)    All other components within normal limits  CBC WITH DIFFERENTIAL/PLATELET - Abnormal; Notable for the following components:   RBC 3.45 (*)    Hemoglobin 10.4 (*)    HCT 33.2 (*)    Monocytes Absolute 1.2 (*)    All other components within normal limits  URINALYSIS, ROUTINE W REFLEX MICROSCOPIC - Abnormal; Notable for the following components:   APPearance CLOUDY (*)    Hgb urine dipstick LARGE (*)    Leukocytes, UA TRACE (*)    RBC / HPF >50 (*)    Bacteria, UA RARE (*)    All other components within normal limits  CULTURE, BLOOD (ROUTINE X 2)  CULTURE, BLOOD (ROUTINE X 2)  URINE CULTURE  BRAIN NATRIURETIC PEPTIDE  MAGNESIUM  PHOSPHORUS  TROPONIN I  I-STAT CG4 LACTIC ACID, ED    EKG None  Radiology Dg Chest Port 1 View  Result Date: 04/11/2018 CLINICAL DATA:  Altered mental status, somnolent. EXAM: PORTABLE CHEST 1 VIEW COMPARISON:  Chest radiograph  Dec 25, 2017 FINDINGS: Stable cardiomegaly. Pulmonary vascular congestion without pleural effusion or focal consolidation. No pneumothorax. Osteopenia. Large body habitus. IMPRESSION: Stable cardiomegaly and pulmonary vascular congestion. Electronically Signed   By: Elon Alas M.D.   On: 04/11/2018 22:26    Procedures Procedures (including critical care time)  Medications Ordered in ED Medications  cefTRIAXone (ROCEPHIN) 1 g in sodium chloride 0.9 % 100 mL IVPB (0 g Intravenous Stopped 04/11/18 2308)  0.9 %  sodium chloride infusion  (has no administration in time range)     Initial Impression / Assessment and Plan / ED Course  I have reviewed the triage vital signs and the nursing notes.  Pertinent labs & imaging results that were available during my care of the patient were reviewed by me and considered in my medical decision making (see chart for details).  Clinical Course as of Apr 13 15  Mon Apr 11, 2018  2353 Normal  I-Stat CG4 Lactic Acid, ED  (not at  [facility]) [EW]  2353 Normal except chloride high, glucose high, calcium low, total protein low, albumin low  Comprehensive metabolic panel(!) [EW]  1829 AST(!): 13 [EW]  2353 Normal except large blood in urine, trace leukocytes, greater than 50 RBCs, 6-10 white cells, and rare bacteria.  Urinalysis, Routine w reflex microscopic (not at [facility])(!) [EW]  2353 Normal except hemoglobin low  CBC WITH DIFFERENTIAL(!) [EW]  2353 Mild pulmonary vascular congestion.  Images reviewed by me  DG Chest [facility] 1 View [EW]    Clinical Course User Index [EW] Daleen Bo, MD     Patient Vitals for the past 24 hrs:  BP Temp Temp src Pulse Resp SpO2 Height Weight  04/12/18 0003 - (!) 96 F (35.6 C) - - - - - -  04/12/18 0001 - (!) 96.2 F (35.7 C) Rectal - - - - -  04/11/18 2300 (!) 85/58 - - 73 15 96 % - -  04/11/18 2230 96/64 - - 62 15 96 % - -  04/11/18 2200 - (!) 96.9 F (36.1 C) Oral - - - - -  04/11/18 2145 (!) 92/50 - - - - - - -  04/11/18 2140 (!) 64/24 - - 63 12 91 % - -  04/11/18 2137 (!) 58/43 - - (!) 52 15 93 % - -  04/11/18 2135 (!) 73/55 - - (!) 103 16 94 % - -  04/11/18 2131 (!) 67/38 - - 68 14 (!) 89 % - -  04/11/18 2130 (!) 52/42 - - (!) 39 14 93 % - -  04/11/18 2129 105/65 (!) 97.5 F (36.4 C) Oral 71 13 94 % - -  04/11/18 2126 105/65 - - 65 17 93 % - -  04/11/18 2123 - - - - - - 5\' 3"  (1.6 m) 125.6 kg    11:54 PM Reevaluation with update and discussion. After initial assessment and treatment, an updated evaluation reveals at this time the  patient remains alert and conversant.  She does remain lethargic, but is really interactive.  Takes a bit dysarthria or aphasia at this time.  She is unable to give additional history, and cannot recall if she has been like this before.  Patient lives in assisted living.  Patient needs more assistance at this time then can be managed in her current living setting therefore she will need to stay in the hospital for further evaluation and treatment.Daleen Bo   Medical Decision Making: Altered mental status with malaise and likely  UTI.  Treatment initiated.  Urine culture ordered.  We will continue IV fluids, and request hospitalist keep patient in hospital for observation until she improves or additional treatment can be initiated.  CRITICAL CARE- yes Performed by: Daleen Bo  Nursing Notes Reviewed/ Care Coordinated Applicable Imaging Reviewed Interpretation of Laboratory Data incorporated into ED treatment   12:05 AM-Consult complete with hospitalist. Patient case explained and discussed.  He agrees to admit patient for further evaluation and treatment. Call ended at 12:10 AM  Final Clinical Impressions(s) / ED Diagnoses   Final diagnoses:  Hypotension, unspecified hypotension type  Urinary tract infection without hematuria, site unspecified    ED Discharge Orders    None       Daleen Bo, MD 04/12/18 [phone]

## 2018-04-11 NOTE — ED Triage Notes (Signed)
Pt brought to ED via [facility] for evaluation. Per EMS, facility states pt has not seemed like herself today. Pt alert but drowsy. Pt oriented x4. Pt with hx stroke and left sided weakness. Pt with no complaints of pain.

## 2018-04-11 NOTE — ED Notes (Signed)
EDP notified of pts BP. Attempting to take manual BP now.

## 2018-04-11 NOTE — ED Notes (Signed)
ED Provider at bedside. 

## 2018-04-12 ENCOUNTER — Inpatient Hospital Stay (HOSPITAL_COMMUNITY): Payer: Medicare Other

## 2018-04-12 ENCOUNTER — Other Ambulatory Visit: Payer: Self-pay | Admitting: Cardiology

## 2018-04-12 ENCOUNTER — Encounter (HOSPITAL_COMMUNITY): Payer: Self-pay | Admitting: Internal Medicine

## 2018-04-12 DIAGNOSIS — Z9109 Other allergy status, other than to drugs and biological substances: Secondary | ICD-10-CM | POA: Diagnosis not present

## 2018-04-12 DIAGNOSIS — I482 Chronic atrial fibrillation: Secondary | ICD-10-CM | POA: Diagnosis not present

## 2018-04-12 DIAGNOSIS — I4891 Unspecified atrial fibrillation: Secondary | ICD-10-CM | POA: Diagnosis present

## 2018-04-12 DIAGNOSIS — D649 Anemia, unspecified: Secondary | ICD-10-CM | POA: Diagnosis present

## 2018-04-12 DIAGNOSIS — I2510 Atherosclerotic heart disease of native coronary artery without angina pectoris: Secondary | ICD-10-CM | POA: Diagnosis present

## 2018-04-12 DIAGNOSIS — R791 Abnormal coagulation profile: Secondary | ICD-10-CM | POA: Diagnosis present

## 2018-04-12 DIAGNOSIS — Z91040 Latex allergy status: Secondary | ICD-10-CM | POA: Diagnosis not present

## 2018-04-12 DIAGNOSIS — Z7901 Long term (current) use of anticoagulants: Secondary | ICD-10-CM | POA: Diagnosis not present

## 2018-04-12 DIAGNOSIS — Z87891 Personal history of nicotine dependence: Secondary | ICD-10-CM | POA: Diagnosis not present

## 2018-04-12 DIAGNOSIS — E785 Hyperlipidemia, unspecified: Secondary | ICD-10-CM | POA: Diagnosis present

## 2018-04-12 DIAGNOSIS — G4733 Obstructive sleep apnea (adult) (pediatric): Secondary | ICD-10-CM | POA: Diagnosis not present

## 2018-04-12 DIAGNOSIS — I959 Hypotension, unspecified: Secondary | ICD-10-CM | POA: Diagnosis present

## 2018-04-12 DIAGNOSIS — R531 Weakness: Secondary | ICD-10-CM | POA: Diagnosis present

## 2018-04-12 DIAGNOSIS — Z79899 Other long term (current) drug therapy: Secondary | ICD-10-CM | POA: Diagnosis not present

## 2018-04-12 DIAGNOSIS — R4182 Altered mental status, unspecified: Secondary | ICD-10-CM | POA: Diagnosis not present

## 2018-04-12 DIAGNOSIS — N390 Urinary tract infection, site not specified: Secondary | ICD-10-CM | POA: Diagnosis present

## 2018-04-12 DIAGNOSIS — K219 Gastro-esophageal reflux disease without esophagitis: Secondary | ICD-10-CM | POA: Diagnosis present

## 2018-04-12 DIAGNOSIS — Z881 Allergy status to other antibiotic agents status: Secondary | ICD-10-CM | POA: Diagnosis not present

## 2018-04-12 DIAGNOSIS — I119 Hypertensive heart disease without heart failure: Secondary | ICD-10-CM | POA: Diagnosis present

## 2018-04-12 DIAGNOSIS — G8324 Monoplegia of upper limb affecting left nondominant side: Secondary | ICD-10-CM | POA: Diagnosis present

## 2018-04-12 DIAGNOSIS — R569 Unspecified convulsions: Secondary | ICD-10-CM | POA: Diagnosis not present

## 2018-04-12 DIAGNOSIS — I10 Essential (primary) hypertension: Secondary | ICD-10-CM | POA: Diagnosis not present

## 2018-04-12 DIAGNOSIS — A419 Sepsis, unspecified organism: Secondary | ICD-10-CM | POA: Diagnosis not present

## 2018-04-12 DIAGNOSIS — F329 Major depressive disorder, single episode, unspecified: Secondary | ICD-10-CM | POA: Diagnosis present

## 2018-04-12 DIAGNOSIS — Z6841 Body Mass Index (BMI) 40.0 and over, adult: Secondary | ICD-10-CM | POA: Diagnosis not present

## 2018-04-12 DIAGNOSIS — G92 Toxic encephalopathy: Secondary | ICD-10-CM | POA: Diagnosis present

## 2018-04-12 DIAGNOSIS — Z9119 Patient's noncompliance with other medical treatment and regimen: Secondary | ICD-10-CM | POA: Diagnosis not present

## 2018-04-12 LAB — CBC WITH DIFFERENTIAL/PLATELET
Basophils Absolute: 0 10*3/uL (ref 0.0–0.1)
Basophils Relative: 0 %
EOS ABS: 0.3 10*3/uL (ref 0.0–0.7)
EOS PCT: 4 %
HCT: 36.5 % (ref 36.0–46.0)
Hemoglobin: 11.3 g/dL — ABNORMAL LOW (ref 12.0–15.0)
LYMPHS ABS: 2.4 10*3/uL (ref 0.7–4.0)
LYMPHS PCT: 35 %
MCH: 30.1 pg (ref 26.0–34.0)
MCHC: 31 g/dL (ref 30.0–36.0)
MCV: 97.1 fL (ref 78.0–100.0)
MONO ABS: 1.1 10*3/uL — AB (ref 0.1–1.0)
MONOS PCT: 15 %
Neutro Abs: 3.1 10*3/uL (ref 1.7–7.7)
Neutrophils Relative %: 46 %
PLATELETS: 278 10*3/uL (ref 150–400)
RBC: 3.76 MIL/uL — ABNORMAL LOW (ref 3.87–5.11)
RDW: 15.6 % — AB (ref 11.5–15.5)
WBC: 6.9 10*3/uL (ref 4.0–10.5)

## 2018-04-12 LAB — BASIC METABOLIC PANEL
Anion gap: 7 (ref 5–15)
BUN: 14 mg/dL (ref 8–23)
CO2: 19 mmol/L — ABNORMAL LOW (ref 22–32)
CREATININE: 0.56 mg/dL (ref 0.44–1.00)
Calcium: 7.8 mg/dL — ABNORMAL LOW (ref 8.9–10.3)
Chloride: 118 mmol/L — ABNORMAL HIGH (ref 98–111)
GFR calc Af Amer: >60 mL/min (ref >60)
GLUCOSE: 118 mg/dL — AB (ref 70–99)
Potassium: 4 mmol/L (ref 3.5–5.1)
SODIUM: 144 mmol/L (ref 135–145)

## 2018-04-12 LAB — PROTIME-INR
INR: 9.65
Prothrombin Time: 77 seconds — ABNORMAL HIGH (ref 11.4–15.2)

## 2018-04-12 LAB — I-STAT CG4 LACTIC ACID, ED: Lactic Acid, Venous: 0.69 mmol/L (ref 0.5–1.9)

## 2018-04-12 LAB — TROPONIN I: Troponin I: <0.03 ng/mL (ref <0.03)

## 2018-04-12 LAB — MAGNESIUM: MAGNESIUM: 1.7 mg/dL (ref 1.7–2.4)

## 2018-04-12 LAB — PHOSPHORUS: PHOSPHORUS: 4.6 mg/dL (ref 2.5–4.6)

## 2018-04-12 LAB — BRAIN NATRIURETIC PEPTIDE: B NATRIURETIC PEPTIDE 5: 203 pg/mL — AB (ref 0.0–100.0)

## 2018-04-12 LAB — MRSA PCR SCREENING: MRSA BY PCR: POSITIVE — AB

## 2018-04-12 MED ORDER — TOPIRAMATE 25 MG PO TABS
50.0000 mg | ORAL_TABLET | Freq: Every day | ORAL | Status: DC
  Administered 2018-04-13 – 2018-04-14 (×2): 50 mg via ORAL
  Filled 2018-04-12 (×2): qty 2

## 2018-04-12 MED ORDER — GABAPENTIN 300 MG PO CAPS
900.0000 mg | ORAL_CAPSULE | Freq: Every day | ORAL | Status: DC
  Administered 2018-04-12 – 2018-04-13 (×2): 900 mg via ORAL
  Filled 2018-04-12 (×2): qty 3

## 2018-04-12 MED ORDER — LORATADINE 10 MG PO TABS
10.0000 mg | ORAL_TABLET | Freq: Every day | ORAL | Status: DC
  Administered 2018-04-13 – 2018-04-14 (×2): 10 mg via ORAL
  Filled 2018-04-12 (×2): qty 1

## 2018-04-12 MED ORDER — DOCUSATE SODIUM 100 MG PO CAPS
100.0000 mg | ORAL_CAPSULE | Freq: Two times a day (BID) | ORAL | Status: DC
  Administered 2018-04-12 – 2018-04-14 (×4): 100 mg via ORAL
  Filled 2018-04-12 (×4): qty 1

## 2018-04-12 MED ORDER — ACETAMINOPHEN 650 MG RE SUPP: 650.0000 mg | Freq: Four times a day (QID) | RECTAL | Status: DC | PRN

## 2018-04-12 MED ORDER — SODIUM CHLORIDE 0.9 % IV SOLN
1.0000 g | Freq: Three times a day (TID) | INTRAVENOUS | Status: DC
  Administered 2018-04-12 – 2018-04-13 (×3): 1 g via INTRAVENOUS
  Filled 2018-04-12 (×4): qty 1

## 2018-04-12 MED ORDER — PANTOPRAZOLE SODIUM 40 MG PO TBEC
40.0000 mg | DELAYED_RELEASE_TABLET | Freq: Every day | ORAL | Status: DC
  Administered 2018-04-13 – 2018-04-14 (×2): 40 mg via ORAL
  Filled 2018-04-12 (×2): qty 1

## 2018-04-12 MED ORDER — VITAMIN K1 10 MG/ML IJ SOLN
5.0000 mg | Freq: Once | INTRAVENOUS | Status: AC
  Administered 2018-04-12: 5 mg via INTRAVENOUS
  Filled 2018-04-12: qty 0.5

## 2018-04-12 MED ORDER — SODIUM CHLORIDE 0.9 % IV SOLN
INTRAVENOUS | Status: DC
  Administered 2018-04-12 – 2018-04-13 (×4): via INTRAVENOUS

## 2018-04-12 MED ORDER — FLUTICASONE PROPIONATE 50 MCG/ACT NA SUSP
2.0000 | Freq: Every day | NASAL | Status: DC
  Administered 2018-04-12 – 2018-04-14 (×3): 2 via NASAL
  Filled 2018-04-12: qty 16

## 2018-04-12 MED ORDER — ONDANSETRON HCL 4 MG PO TABS
4.0000 mg | ORAL_TABLET | Freq: Four times a day (QID) | ORAL | Status: DC | PRN
  Administered 2018-04-12: 4 mg via ORAL
  Filled 2018-04-12: qty 1

## 2018-04-12 MED ORDER — ATORVASTATIN CALCIUM 10 MG PO TABS
10.0000 mg | ORAL_TABLET | Freq: Every day | ORAL | Status: DC
  Administered 2018-04-13: 10 mg via ORAL
  Filled 2018-04-12: qty 1

## 2018-04-12 MED ORDER — SODIUM CHLORIDE 0.9 % IV BOLUS (SEPSIS)
1000.0000 mL | Freq: Once | INTRAVENOUS | Status: AC
  Administered 2018-04-12: 1000 mL via INTRAVENOUS

## 2018-04-12 MED ORDER — OXYBUTYNIN CHLORIDE 5 MG PO TABS
5.0000 mg | ORAL_TABLET | Freq: Two times a day (BID) | ORAL | Status: DC
  Administered 2018-04-12 – 2018-04-14 (×4): 5 mg via ORAL
  Filled 2018-04-12 (×4): qty 1

## 2018-04-12 MED ORDER — LINACLOTIDE 145 MCG PO CAPS
290.0000 ug | ORAL_CAPSULE | Freq: Every day | ORAL | Status: DC
  Administered 2018-04-13 – 2018-04-14 (×2): 290 ug via ORAL
  Filled 2018-04-12 (×2): qty 2

## 2018-04-12 MED ORDER — ACETAMINOPHEN 325 MG PO TABS: 650.0000 mg | ORAL_TABLET | Freq: Four times a day (QID) | ORAL | Status: DC | PRN

## 2018-04-12 MED ORDER — TOPIRAMATE 25 MG PO TABS
100.0000 mg | ORAL_TABLET | Freq: Every day | ORAL | Status: DC
  Administered 2018-04-12 – 2018-04-13 (×2): 100 mg via ORAL
  Filled 2018-04-12 (×2): qty 4

## 2018-04-12 MED ORDER — SENNA 8.6 MG PO TABS
17.2000 mg | ORAL_TABLET | Freq: Every day | ORAL | Status: DC
  Administered 2018-04-13 – 2018-04-14 (×2): 17.2 mg via ORAL
  Filled 2018-04-12 (×2): qty 2

## 2018-04-12 MED ORDER — POTASSIUM CHLORIDE IN NACL 20-0.9 MEQ/L-% IV SOLN
INTRAVENOUS | Status: AC
  Administered 2018-04-12: 02:00:00 via INTRAVENOUS
  Filled 2018-04-12: qty 1000

## 2018-04-12 MED ORDER — WARFARIN - PHARMACIST DOSING INPATIENT
Status: DC
  Administered 2018-04-13: 18:00:00

## 2018-04-12 MED ORDER — VITAMIN K1 10 MG/ML IJ SOLN
INTRAMUSCULAR | Status: AC
  Filled 2018-04-12: qty 1

## 2018-04-12 MED ORDER — VITAMIN B-12 1000 MCG PO TABS
500.0000 ug | ORAL_TABLET | Freq: Every day | ORAL | Status: DC
  Administered 2018-04-13 – 2018-04-14 (×2): 500 ug via ORAL
  Filled 2018-04-12 (×2): qty 1

## 2018-04-12 MED ORDER — GABAPENTIN 300 MG PO CAPS
300.0000 mg | ORAL_CAPSULE | Freq: Two times a day (BID) | ORAL | Status: DC
  Administered 2018-04-13 – 2018-04-14 (×4): 300 mg via ORAL
  Filled 2018-04-12 (×4): qty 1

## 2018-04-12 MED ORDER — ONDANSETRON HCL 4 MG/2ML IJ SOLN: 4.0000 mg | Freq: Four times a day (QID) | INTRAMUSCULAR | Status: DC | PRN

## 2018-04-12 MED ORDER — SODIUM CHLORIDE 0.9 % IV SOLN
2.0000 g | Freq: Once | INTRAVENOUS | Status: AC
  Administered 2018-04-12: 2 g via INTRAVENOUS
  Filled 2018-04-12: qty 2

## 2018-04-12 MED ORDER — DULOXETINE HCL 60 MG PO CPEP
60.0000 mg | ORAL_CAPSULE | Freq: Every day | ORAL | Status: DC
  Administered 2018-04-13 – 2018-04-14 (×2): 60 mg via ORAL
  Filled 2018-04-12 (×2): qty 1

## 2018-04-12 MED ORDER — MONTELUKAST SODIUM 10 MG PO TABS
10.0000 mg | ORAL_TABLET | Freq: Every day | ORAL | Status: DC
  Administered 2018-04-13 – 2018-04-14 (×2): 10 mg via ORAL
  Filled 2018-04-12 (×2): qty 1

## 2018-04-12 MED ORDER — PNEUMOCOCCAL VAC POLYVALENT 25 MCG/0.5ML IJ INJ
0.5000 mL | INJECTION | INTRAMUSCULAR | Status: DC
  Filled 2018-04-12: qty 0.5

## 2018-04-12 NOTE — Progress Notes (Signed)
INR value relayed to Hospitalist. Orders received. In process.

## 2018-04-12 NOTE — Progress Notes (Signed)
Pharmacy Antibiotic Note  Deborah Maddox is a 62 y.o. female admitted on 04/11/2018 with UTI.  Pharmacy has been consulted for Cefepime dosing.  Initial dose given in ED.  Plan: Cefepime 1gm IV every 8 hours. Monitor labs, micro and vitals.   Height: 5\' 3"  (160 cm) Weight: 286 lb 13.1 oz (130.1 kg) IBW/kg (Calculated) : 52.4  Temp (24hrs), Avg:97.1 F (36.2 C), Min:96 F (35.6 C), Max:98 F (36.7 C)  Recent Labs  Lab 04/11/18 2200 04/11/18 2246 04/12/18 0112 04/12/18 0502  WBC 8.5  --   --  6.9  CREATININE 0.75  --   --  0.56  LATICACIDVEN  --  0.75 0.69  --     Estimated Creatinine Clearance: 93.6 mL/min (by C-G formula based on SCr of 0.56 mg/dL).    Allergies  Allergen Reactions  . Vancomycin Hives and Itching  . Latex Rash  . Other Rash    HEART  MONITOR TAPE: severe rash, bleeding   Antimicrobials this admission: Cefepime 9/3 >>    >>   Dose adjustments this admission: n/a   Microbiology results: 9/2 BCx: pending 9/2 UCx: pending   Sputum:   9/3 MRSA PCR: (+)  Thank you for allowing pharmacy to be a part of this patient's care.  Pricilla Larsson 04/12/2018 10:14 AM

## 2018-04-12 NOTE — H&P (Signed)
History and Physical    Deborah Maddox JEH:[id number] DOB: Aug 03, 1956 DOA: 04/11/2018  PCP: Deborah Mans, NP   Patient coming from: Nursing home.  I have personally briefly reviewed patient's old medical records in [place]  Chief Complaint: AMS.  HPI: Deborah Maddox is a 62 y.o. female with medical history significant of A. fib, anxiety, osteoarthritis, cervical cancer, congenital heart defect, coronary artery disease, left foot drop, hypertension, left arm paralysis, history of PE, history of remote seizures, sleep apnea but refuses to use CPAP, history of CVA at age 21 who is sent from the nursing home for evaluation of altered mental status.  She denies any pain or discomfort at this time, but is somnolent to provide full review of system.  ED Course: Initial vital signs temperature 97.5 F, pulse 65, respiration 17, blood pressure 105/65 mmHg.  She was given ceftriaxone in the emergency department.  I added a 1 L bolus with potassium supplementation and 2 L of normal saline.  I switched the patient to cefepime.  Urinalysis showed large hemoglobinuria, trace leukocyte esterase,.  Microscopic showed more than 50 RBC, 6-10 WBC rare bacteria.  CBC showed a white count of 8.5 with 44% neutrophils, 38% lymphocytes and 14% monocytes.  Hemoglobin 10.4 g/dL and platelets 299.  Sodium 141, potassium 3.5, chloride 114, CO2 22, glucose 135, BUN 17, creatinine 0.75.  Lactic acid was normal.  Troponin was normal.  Her PTT was 77.0 seconds and INR 9.65.  Her chest radiograph shows stable cardiomegaly and pulmonary vascular congestion.  Review of Systems: Unable to fully obtain.  Past Medical History:  Diagnosis Date  . A-fib ([place])   . Anxiety   . Arthritis   . Cancer (HCC)    cervical  . 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 Coronary artery disease   . Foot drop    left foot  . Hypertension   . Left foot drop   . Paralysis ([place])    left arm  . PONV  (postoperative nausea and vomiting)   . Pulmonary emboli ([place]) 2014  . Seizures ([place])    last one 10 years ago  . Sleep apnea    Refuses to use machine because of recurrent pneumonia  . Stroke [facility])    at age 80, left sided weakness-complete paralysis of left arm.    Past Surgical History:  Procedure Laterality Date  . BREAST SURGERY    . CERVICAL CONE BIOPSY     cervical cancer  . COLONOSCOPY  2006   Dr. Anthony Sar: normal colonoscopy   . COLONOSCOPY WITH PROPOFOL N/A 10/06/2012   Dr. Gala Romney: prominent external hemorrhoids, colonic lipoma. hepatic flexure polyp, tubular adenoma.   . COLONOSCOPY WITH PROPOFOL N/A 03/17/2018   Procedure: COLONOSCOPY WITH PROPOFOL;  Surgeon: Daneil Dolin, MD;  Location: [facility];  Service: Endoscopy;  Laterality: N/A;  10:00am  . DILATION AND CURETTAGE OF UTERUS    . LEG SURGERY    . LEG SURGERY     left  . OPEN REDUCTION INTERNAL FIXATION (ORIF) HAND     right hand and arm  . POLYPECTOMY N/A 10/06/2012   Procedure: POLYPECTOMY;  Surgeon: Daneil Dolin, MD;  Location: [facility] ORS;  Service: Endoscopy;  Laterality: N/A;  hepatic flexure polyp  . TONSILLECTOMY       reports that she quit smoking about 37 years ago. She quit after 5.00 years of use. She has never used  smokeless tobacco. She reports that she does not drink alcohol or use drugs.  Allergies  Allergen Reactions  . Vancomycin Hives and Itching  . Latex Rash  . Other Rash    HEART  MONITOR TAPE: severe rash, bleeding    Family History  Problem Relation Age of Onset  . Diabetes Mother   . Liver cancer Mother   . Diabetes Sister   . Cancer Brother        "all over"   . Cancer Other        uncle  . Diabetes Other   . Colon cancer Unknown        unsure  . Mesothelioma Father     Prior to Admission medications   Medication Sig Start Date End Date Taking? Authorizing Provider  atorvastatin (LIPITOR) 10 MG tablet Take 10 mg by mouth at bedtime.   Yes [provider]    Cholecalciferol (VITAMIN D) 2000 UNITS CAPS Take 2,000 Units by mouth daily. At 1300   Yes [provider]  diltiazem (TIAZAC) 300 MG 24 hr capsule Take 300 mg by mouth daily.   Yes [provider]  docusate sodium (COLACE) 100 MG capsule Take 100 mg by mouth 2 (two) times daily.    Yes [provider]  DULoxetine (CYMBALTA) 60 MG capsule Take 60 mg by mouth daily.     Yes [provider]  fluticasone (FLONASE) 50 MCG/ACT nasal spray Place 2 sprays into both nostrils daily.   Yes [provider]  gabapentin (NEURONTIN) 300 MG capsule Take 300-900 mg by mouth See admin instructions. Take 300 mg by mouth every morning and evening and 900 mg at bedtime   Yes [provider]  Linaclotide (LINZESS) 290 MCG CAPS Take 1 capsule by mouth daily. 30 minutes before breakfast. 10/13/12  Yes Annitta Needs, NP  lisinopril (PRINIVIL,ZESTRIL) 40 MG tablet Take 40 mg by mouth daily.     Yes [provider]  loratadine (CLARITIN) 10 MG tablet Take 10 mg by mouth daily.   Yes [provider]  montelukast (SINGULAIR) 10 MG tablet Take 10 mg by mouth daily.   Yes [provider]  omeprazole (PRILOSEC) 20 MG capsule Take 20 mg by mouth daily.     Yes [provider]  oxybutynin (DITROPAN) 5 MG tablet Take 5 mg by mouth 2 (two) times daily.   Yes [provider]  senna (SENOKOT) 8.6 MG TABS Take 17.2 mg by mouth daily.    Yes [provider]  topiramate (TOPAMAX) 50 MG tablet Take 50-100 mg by mouth See admin instructions. Take 50 mg in the morning and 100 mg at bedtime   Yes [provider]  vitamin B-12 (CYANOCOBALAMIN) 500 MCG tablet Take 500 mcg by mouth daily.   Yes [provider]  warfarin (COUMADIN) 2.5 MG tablet Take 1 tablet daily (2.5mg ) daily except 2 tablets (5mg ) on Mondays and Thursdays Patient taking differently: Take 1 tablet daily (2.5mg ) daily at 1600 except 2 tablets (5mg ) on  Mondays and Thursdays 01/27/18  Yes Branch, Alphonse Guild, MD  cyclobenzaprine (FLEXERIL) 10 MG tablet Take 10 mg by mouth 3 (three) times daily as needed (headaches).    [provider]  diclofenac sodium (VOLTAREN) 1 % GEL Apply 2-4 g topically 3 (three) times daily. to knees    [provider]  enoxaparin (LOVENOX) 120 MG/0.8ML injection Inject 0.8 mLs (120 mg total) into the skin daily. At 8am 03/14/18   Branch,  Alphonse Guild, MD  polyethylene glycol-electrolytes (TRILYTE) 420 g solution Take 4,000 mLs by mouth as directed. 01/07/18   Daneil Dolin, MD    Physical Exam: Vitals:   04/12/18 0100 04/12/18 0130 04/12/18 0200 04/12/18 0248  BP: (!) 85/69 106/67 101/72   Pulse: 79 70 69   Resp: 15 13 15    Temp:    (!) 97.4 F (36.3 C)  TempSrc:    Axillary  SpO2: 97% 98% 97%   Weight:    130.1 kg  Height:    5\' 3"  (1.6 m)    Constitutional: Somnolent.  Wakes up briefly. Eyes: PERRL, lids and conjunctivae normal ENMT: Mucous membranes are dry. Posterior pharynx clear of any exudate or lesions. Neck: normal, supple, no masses, no thyromegaly Respiratory: Decreased breath sounds on bases, but otherwise clear to auscultation bilaterally, no wheezing, no crackles. Normal respiratory effort. No accessory muscle use.  Cardiovascular: Regular rate and rhythm, no murmurs / rubs / gallops.  Edema of left lower extremity due to her prescribed boot use.  This seems to be better after it was taken off.  2+ pedal pulses. No carotid bruits.  Abdomen: no tenderness, no masses palpated. No hepatosplenomegaly. Bowel sounds positive.  Musculoskeletal: no clubbing / cyanosis. No joint deformity upper and lower extremities. Good ROM, no contractures. Normal muscle tone.  Skin: Mild erythema on left foot where the boot was in place. Neurologic: Left arm paralysis and left foot drop. Psychiatric: Somnolent.  Labs on Admission: I have personally reviewed following labs and imaging  studies  CBC: Recent Labs  Lab 04/11/18 2200  WBC 8.5  NEUTROABS 3.8  HGB 10.4*  HCT 33.2*  MCV 96.2  PLT 381   Basic Metabolic Panel: Recent Labs  Lab 04/11/18 2200  NA 141  K 3.5  CL 114*  CO2 22  GLUCOSE 135*  BUN 17  CREATININE 0.75  CALCIUM 8.0*  MG 1.7  PHOS 4.6   GFR: Estimated Creatinine Clearance: 93.6 mL/min (by C-G formula based on SCr of 0.75 mg/dL). Liver Function Tests: Recent Labs  Lab 04/11/18 2200  AST 13*  ALT 11  ALKPHOS 71  BILITOT 0.5  PROT 6.1*  ALBUMIN 3.2*   No results for input(s): LIPASE, AMYLASE in the last 168 hours. No results for input(s): AMMONIA in the last 168 hours. Coagulation Profile: Recent Labs  Lab 04/12/18 0054  INR 9.65*   Cardiac Enzymes: Recent Labs  Lab 04/12/18 0054  TROPONINI <0.03   BNP (last 3 results) No results for input(s): PROBNP in the last 8760 hours. HbA1C: No results for input(s): HGBA1C in the last 72 hours. CBG: No results for input(s): GLUCAP in the last 168 hours. Lipid Profile: No results for input(s): CHOL, HDL, LDLCALC, TRIG, CHOLHDL, LDLDIRECT in the last 72 hours. Thyroid Function Tests: No results for input(s): TSH, T4TOTAL, FREET4, T3FREE, THYROIDAB in the last 72 hours. Anemia Panel: No results for input(s): VITAMINB12, FOLATE, FERRITIN, TIBC, IRON, RETICCTPCT in the last 72 hours. Urine analysis:    Component Value Date/Time   COLORURINE YELLOW 04/11/2018 2240   APPEARANCEUR CLOUDY (A) 04/11/2018 2240   LABSPEC 1.008 04/11/2018 2240   PHURINE 5.0 04/11/2018 2240   GLUCOSEU NEGATIVE 04/11/2018 2240   HGBUR LARGE (A) 04/11/2018 2240   BILIRUBINUR NEGATIVE 04/11/2018 2240   KETONESUR NEGATIVE 04/11/2018 2240   PROTEINUR NEGATIVE 04/11/2018 2240   UROBILINOGEN 0.2 05/31/2015 0235   NITRITE NEGATIVE 04/11/2018 2240   LEUKOCYTESUR TRACE (A) 04/11/2018 2240    Radiological  Exams on Admission: Dg Chest Port 1 View  Result Date: 04/11/2018 CLINICAL DATA:  Altered mental  status, somnolent. EXAM: PORTABLE CHEST 1 VIEW COMPARISON:  Chest radiograph Dec 25, 2017 FINDINGS: Stable cardiomegaly. Pulmonary vascular congestion without pleural effusion or focal consolidation. No pneumothorax. Osteopenia. Large body habitus. IMPRESSION: Stable cardiomegaly and pulmonary vascular congestion. Electronically Signed   By: Elon Alas M.D.   On: 04/11/2018 22:26    EKG: Independently reviewed.  Vent. rate 67 BPM PR interval * ms QRS duration 107 ms QT/QTc 440/465 ms P-R-T axes * 18 -5 Atrial fibrillation Low voltage, precordial leads Borderline T abnormalities, inferior leads  Assessment/Plan Principal Problem:   Sepsis due to undetermined organism [facility]) Possible source may be urinary tract. Admit to stepdown/inpatient. Continue IV fluids. Continue supplemental oxygen. Switch to ceftriaxone to cefepime. Follow-up blood culture and sensitivity. Follow-up urine culture and sensitivity.  Active Problems:   Altered mental status Secondary to sepsis. Continue treatment for above. Hold oral medications until more alert. CT head did not show any acute intracranial abnormality.    Supratherapeutic INR Hold warfarin. Vitamin K 5 mg IVP x1. Follow-up PT/INR.    Hypertension Hold antihypertensives for now. Monitor blood pressure.    Atrial fibrillation (HCC) CHA?DS?-VASc Score of at least 7 Hold warfarin due to supratherapeutic INR. Hold negative chronotropic's due to hypotension.    Sleep apnea Does not like to use mask.    Coronary artery disease Hold warfarin and atorvastatin.    Seizures (HCC) Continue gabapentin.    Anemia Monitor hematocrit and hemoglobin.    DVT prophylaxis: Supratherapeutic INR. Code Status: Code full. Family Communication: None at bedside. Disposition Plan: Admit for IV antibiotics and AMS work-up.. Consults called:  Admission status: Inpatient/stepdown.   Reubin Milan MD [facility] Pager  [phone].  If 7PM-7AM, please contact night-coverage [email or web address] Password [email or web address]  04/12/2018, 3:59 AM

## 2018-04-12 NOTE — Progress Notes (Signed)
Deborah Maddox is a 62 y.o. female with medical history significant of A. fib, anxiety, osteoarthritis, cervical cancer, congenital heart defect, coronary artery disease, left foot drop, hypertension, left arm paralysis, history of PE, history of remote seizures, sleep apnea but refuses to use CPAP, history of CVA at age 60 who is sent from the nursing home for evaluation of altered mental status.  She was noted to have suspected acute metabolic encephalopathy secondary to sepsis, but of undetermined organism.  Possible source may be a UTI for which she has been started on cefepime for coverage.  MRSA nares is currently positive.  Other blood and urine cultures are currently pending.  CT head with no acute abnormalities.  She is currently on warfarin with supratherapeutic INR no overt bleeding identified.  She has been given 1 dose of IV vitamin K and pharmacy is following.  She is still quite altered and fairly somnolent this morning.  Continue to monitor in stepdown unit and follow cultures.  May transfer to floor once more alert and awake.  She appears to be from assisted living facility.

## 2018-04-12 NOTE — Progress Notes (Signed)
[place] for Warfarin Indication: atrial fibrillation  Allergies  Allergen Reactions  . Vancomycin Hives and Itching  . Latex Rash  . Other Rash    HEART  MONITOR TAPE: severe rash, bleeding   Patient Measurements: Height: 5\' 3"  (160 cm) Weight: 286 lb 13.1 oz (130.1 kg) IBW/kg (Calculated) : 52.4 Heparin Dosing Weight:   Vital Signs: Temp: 98 F (36.7 C) (09/03 0816) Temp Source: Axillary (09/03 0816) BP: 90/58 (09/03 0700) Pulse Rate: 64 (09/03 0700)  Labs: Recent Labs    04/11/18 2200 04/12/18 0054 04/12/18 0502  HGB 10.4*  --  11.3*  HCT 33.2*  --  36.5  PLT 299  --  278  LABPROT  --  77.0*  --   INR  --  9.65*  --   CREATININE 0.75  --  0.56  TROPONINI  --  <0.03  --    Estimated Creatinine Clearance: 93.6 mL/min (by C-G formula based on SCr of 0.56 mg/dL).  Medical History: Past Medical History:  Diagnosis Date  . A-fib ([place])   . Anxiety   . Arthritis   . Cancer (HCC)    cervical  . 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 Coronary artery disease   . Foot drop    left foot  . Hypertension   . Left foot drop   . Paralysis ([place])    left arm  . PONV (postoperative nausea and vomiting)   . Pulmonary emboli ([place]) 2014  . Seizures ([place])    last one 10 years ago  . Sleep apnea    Refuses to use machine because of recurrent pneumonia  . Stroke [facility])    at age 77, left sided weakness-complete paralysis of left arm.   Medications:  Medications Prior to Admission  Medication Sig Dispense Refill Last Dose  . atorvastatin (LIPITOR) 10 MG tablet Take 10 mg by mouth at bedtime.   04/10/2018 at Unknown time  . Cholecalciferol (VITAMIN D) 2000 UNITS CAPS Take 2,000 Units by mouth daily. At 1300   04/11/2018 at Unknown time  . diltiazem (TIAZAC) 300 MG 24 hr capsule Take 300 mg by mouth daily.   04/11/2018 at Unknown time  . docusate sodium (COLACE) 100 MG capsule Take 100 mg by mouth 2 (two) times  daily.    04/11/2018 at Unknown time  . DULoxetine (CYMBALTA) 60 MG capsule Take 60 mg by mouth daily.     04/11/2018 at Unknown time  . fluticasone (FLONASE) 50 MCG/ACT nasal spray Place 2 sprays into both nostrils daily.   04/11/2018 at Unknown time  . gabapentin (NEURONTIN) 300 MG capsule Take 300-900 mg by mouth See admin instructions. Take 300 mg by mouth every morning and evening and 900 mg at bedtime   04/11/2018 at Unknown time  . Linaclotide (LINZESS) 290 MCG CAPS Take 1 capsule by mouth daily. 30 minutes before breakfast. 30 capsule 3 04/11/2018 at Unknown time  . lisinopril (PRINIVIL,ZESTRIL) 40 MG tablet Take 40 mg by mouth daily.     04/11/2018 at Unknown time  . loratadine (CLARITIN) 10 MG tablet Take 10 mg by mouth daily.   04/11/2018 at Unknown time  . montelukast (SINGULAIR) 10 MG tablet Take 10 mg by mouth daily.   04/11/2018 at Unknown time  . omeprazole (PRILOSEC) 20 MG capsule Take 20 mg by mouth daily.     04/11/2018 at Unknown time  . oxybutynin (DITROPAN) 5 MG tablet  Take 5 mg by mouth 2 (two) times daily.   04/11/2018 at Unknown time  . senna (SENOKOT) 8.6 MG TABS Take 17.2 mg by mouth daily.    04/11/2018 at Unknown time  . topiramate (TOPAMAX) 50 MG tablet Take 50-100 mg by mouth See admin instructions. Take 50 mg in the morning and 100 mg at bedtime   04/11/2018 at Unknown time  . vitamin B-12 (CYANOCOBALAMIN) 500 MCG tablet Take 500 mcg by mouth daily.   04/11/2018 at Unknown time  . warfarin (COUMADIN) 2.5 MG tablet Take 1 tablet daily (2.5mg ) daily except 2 tablets (5mg ) on Mondays and Thursdays (Patient taking differently: Take 1 tablet daily (2.5mg ) daily at 1600 except 2 tablets (5mg ) on Mondays and Thursdays) 45 tablet 3 04/11/2018 at 1600  . cyclobenzaprine (FLEXERIL) 10 MG tablet Take 10 mg by mouth 3 (three) times daily as needed (headaches).   03/16/2018 at Unknown time  . diclofenac sodium (VOLTAREN) 1 % GEL Apply 2-4 g topically 3 (three) times daily. to knees   03/16/2018 at Unknown time  .  enoxaparin (LOVENOX) 120 MG/0.8ML injection Inject 0.8 mLs (120 mg total) into the skin daily. At 8am 10 Syringe 0 03/16/2018 at Unknown time  . polyethylene glycol-electrolytes (TRILYTE) 420 g solution Take 4,000 mLs by mouth as directed. 4000 mL 0 03/17/2018 at Unknown time   Assessment: Okay for Protocol, elevated INR on admission. Vit K given.  Goal of Therapy:  INR 2-3   Plan:  No warfarin today, f/u daily PT/INR Monitor for signs and symptoms of bleeding.   Biagio Quint R 04/12/2018,10:32 AM

## 2018-04-13 DIAGNOSIS — I10 Essential (primary) hypertension: Secondary | ICD-10-CM

## 2018-04-13 DIAGNOSIS — I482 Chronic atrial fibrillation: Secondary | ICD-10-CM

## 2018-04-13 DIAGNOSIS — G4733 Obstructive sleep apnea (adult) (pediatric): Secondary | ICD-10-CM

## 2018-04-13 DIAGNOSIS — N390 Urinary tract infection, site not specified: Principal | ICD-10-CM

## 2018-04-13 DIAGNOSIS — R4182 Altered mental status, unspecified: Secondary | ICD-10-CM

## 2018-04-13 DIAGNOSIS — R569 Unspecified convulsions: Secondary | ICD-10-CM

## 2018-04-13 DIAGNOSIS — R791 Abnormal coagulation profile: Secondary | ICD-10-CM

## 2018-04-13 LAB — URINE CULTURE: Culture: NO GROWTH

## 2018-04-13 LAB — PROTIME-INR
INR: 2.24
Prothrombin Time: 24.6 seconds — ABNORMAL HIGH (ref 11.4–15.2)

## 2018-04-13 LAB — CBC WITH DIFFERENTIAL/PLATELET
Basophils Absolute: 0 10*3/uL (ref 0.0–0.1)
Basophils Relative: 0 %
EOS PCT: 3 %
Eosinophils Absolute: 0.3 10*3/uL (ref 0.0–0.7)
HEMATOCRIT: 40.9 % (ref 36.0–46.0)
Hemoglobin: 13 g/dL (ref 12.0–15.0)
LYMPHS ABS: 2.1 10*3/uL (ref 0.7–4.0)
Lymphocytes Relative: 20 %
MCH: 31.1 pg (ref 26.0–34.0)
MCHC: 31.8 g/dL (ref 30.0–36.0)
MCV: 97.8 fL (ref 78.0–100.0)
MONO ABS: 1.3 10*3/uL (ref 0.1–1.0)
Monocytes Relative: 13 %
Neutro Abs: 6.8 10*3/uL (ref 1.7–7.7)
Neutrophils Relative %: 64 %
Platelets: 222 10*3/uL (ref 150–400)
RBC: 4.18 MIL/uL (ref 3.87–5.11)
RDW: 15.2 % (ref 11.5–15.5)
WBC: 10.6 10*3/uL — ABNORMAL HIGH (ref 4.0–10.5)

## 2018-04-13 LAB — BASIC METABOLIC PANEL
Anion gap: 6 (ref 5–15)
BUN: 8 mg/dL (ref 8–23)
CALCIUM: 8.2 mg/dL — AB (ref 8.9–10.3)
CO2: 21 mmol/L — AB (ref 22–32)
CREATININE: 0.47 mg/dL (ref 0.44–1.00)
Chloride: 114 mmol/L — ABNORMAL HIGH (ref 98–111)
GFR calc Af Amer: >60 mL/min (ref >60)
GFR calc non Af Amer: >60 mL/min (ref >60)
GLUCOSE: 116 mg/dL — AB (ref 70–99)
Potassium: 3.7 mmol/L (ref 3.5–5.1)
Sodium: 141 mmol/L (ref 135–145)

## 2018-04-13 LAB — HIV ANTIBODY (ROUTINE TESTING W REFLEX): HIV SCREEN 4TH GENERATION: NONREACTIVE

## 2018-04-13 MED ORDER — CEFDINIR 300 MG PO CAPS
300.0000 mg | ORAL_CAPSULE | Freq: Two times a day (BID) | ORAL | Status: DC
  Administered 2018-04-13 – 2018-04-14 (×3): 300 mg via ORAL
  Filled 2018-04-13 (×3): qty 1

## 2018-04-13 MED ORDER — WARFARIN SODIUM 2.5 MG PO TABS
2.5000 mg | ORAL_TABLET | Freq: Once | ORAL | Status: AC
  Administered 2018-04-13: 2.5 mg via ORAL
  Filled 2018-04-13: qty 1

## 2018-04-13 NOTE — Progress Notes (Signed)
[place] for Warfarin Indication: atrial fibrillation  Allergies  Allergen Reactions  . Vancomycin Hives and Itching  . Latex Rash  . Other Rash    HEART  MONITOR TAPE: severe rash, bleeding   Patient Measurements: Height: 5\' 3"  (160 cm) Weight: 285 lb 11.5 oz (129.6 kg) IBW/kg (Calculated) : 52.4  Vital Signs: Temp: 98.4 F (36.9 C) (09/04 0752) Temp Source: Axillary (09/04 0752) BP: 137/84 (09/04 0300) Pulse Rate: 105 (09/04 0752)  Labs: Recent Labs    04/11/18 2200 04/12/18 0054 04/12/18 0502 04/13/18 0402  HGB 10.4*  --  11.3* 13.0  HCT 33.2*  --  36.5 40.9  PLT 299  --  278 222  LABPROT  --  77.0*  --  24.6*  INR  --  9.65*  --  2.24  CREATININE 0.75  --  0.56 0.47  TROPONINI  --  <0.03  --   --    Estimated Creatinine Clearance: 93.4 mL/min (by C-G formula based on SCr of 0.47 mg/dL).  Medical History: Past Medical History:  Diagnosis Date  . A-fib ([place])   . Anxiety   . Arthritis   . Cancer (HCC)    cervical  . 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 Coronary artery disease   . Foot drop    left foot  . Hypertension   . Left foot drop   . Paralysis ([place])    left arm  . PONV (postoperative nausea and vomiting)   . Pulmonary emboli ([place]) 2014  . Seizures ([place])    last one 10 years ago  . Sleep apnea    Refuses to use machine because of recurrent pneumonia  . Stroke [facility])    at age 28, left sided weakness-complete paralysis of left arm.   Medications:  Medications Prior to Admission  Medication Sig Dispense Refill Last Dose  . atorvastatin (LIPITOR) 10 MG tablet Take 10 mg by mouth at bedtime.   04/10/2018 at Unknown time  . Cholecalciferol (VITAMIN D) 2000 UNITS CAPS Take 2,000 Units by mouth daily. At 1300   04/11/2018 at Unknown time  . diltiazem (TIAZAC) 300 MG 24 hr capsule Take 300 mg by mouth daily.   04/11/2018 at Unknown time  . docusate sodium (COLACE) 100 MG capsule Take 100  mg by mouth 2 (two) times daily.    04/11/2018 at Unknown time  . DULoxetine (CYMBALTA) 60 MG capsule Take 60 mg by mouth daily.     04/11/2018 at Unknown time  . fluticasone (FLONASE) 50 MCG/ACT nasal spray Place 2 sprays into both nostrils daily.   04/11/2018 at Unknown time  . gabapentin (NEURONTIN) 300 MG capsule Take 300-900 mg by mouth See admin instructions. Take 300 mg by mouth every morning and evening and 900 mg at bedtime   04/11/2018 at Unknown time  . Linaclotide (LINZESS) 290 MCG CAPS Take 1 capsule by mouth daily. 30 minutes before breakfast. 30 capsule 3 04/11/2018 at Unknown time  . lisinopril (PRINIVIL,ZESTRIL) 40 MG tablet Take 40 mg by mouth daily.     04/11/2018 at Unknown time  . loratadine (CLARITIN) 10 MG tablet Take 10 mg by mouth daily.   04/11/2018 at Unknown time  . montelukast (SINGULAIR) 10 MG tablet Take 10 mg by mouth daily.   04/11/2018 at Unknown time  . omeprazole (PRILOSEC) 20 MG capsule Take 20 mg by mouth daily.     04/11/2018 at Unknown time  .  oxybutynin (DITROPAN) 5 MG tablet Take 5 mg by mouth 2 (two) times daily.   04/11/2018 at Unknown time  . senna (SENOKOT) 8.6 MG TABS Take 17.2 mg by mouth daily.    04/11/2018 at Unknown time  . topiramate (TOPAMAX) 50 MG tablet Take 50-100 mg by mouth See admin instructions. Take 50 mg in the morning and 100 mg at bedtime   04/11/2018 at Unknown time  . vitamin B-12 (CYANOCOBALAMIN) 500 MCG tablet Take 500 mcg by mouth daily.   04/11/2018 at Unknown time  . cyclobenzaprine (FLEXERIL) 10 MG tablet Take 10 mg by mouth 3 (three) times daily as needed (headaches).   03/16/2018 at Unknown time  . diclofenac sodium (VOLTAREN) 1 % GEL Apply 2-4 g topically 3 (three) times daily. to knees   03/16/2018 at Unknown time  . enoxaparin (LOVENOX) 120 MG/0.8ML injection Inject 0.8 mLs (120 mg total) into the skin daily. At 8am 10 Syringe 0 03/16/2018 at Unknown time  . polyethylene glycol-electrolytes (TRILYTE) 420 g solution Take 4,000 mLs by mouth as directed.  4000 mL 0 03/17/2018 at Unknown time   Assessment: Okay for Protocol, elevated INR on admission. Vit K 5mg  IV given 9/3 at 0315. INR within therapeutic range this AM. Last anti-coag visit, INR 3.3(8/14) and coumadin dose changed to 5mg  Monday and Thursday; 2.5mg  all other days.   Goal of Therapy:  INR 2-3   Plan:  Coumadin 2.5mg  po x 1 today PT-INR daily Monitor for signs and symptoms of bleeding.   Isac Sarna, BS [place]ena [place], BCPS Clinical Pharmacist Pager [phone] 04/13/2018,9:37 AM

## 2018-04-13 NOTE — Clinical Social Work Note (Signed)
Clinical Social Work Assessment  Patient Details  Name: Deborah Maddox MRN: [id number] Date of Birth: 1956/01/02  Date of referral:  04/13/18               Reason for consult:  Discharge Planning                Permission sought to share information with:    Permission granted to share information::     Name::        Agency::     Relationship::     Contact Information:     Housing/Transportation Living arrangements for the past 2 months:  [place] of Information:  Facility Patient Interpreter Needed:  None Criminal Activity/Legal Involvement Pertinent to Current Situation/Hospitalization:  No - Comment as needed Significant Relationships:  Siblings Lives with:  Facility Resident Do you feel safe going back to the place where you live?  Yes Need for family participation in patient care:  No (Coment)  Care giving concerns: Pt resides at an ALF.   Social Worker assessment / plan: Pt is a 62 year old female admitted from [facility]. Spoke with staff at the ALF today to update. Per MD, pt may be able to dc tomorrow. Pt will likely been [facility] transport. At baseline, pt does not use O2, she mostly uses wheelchair for mobility though she can pivot and transfer with assistance. Pt's sister is her family support. Pt receives assistance with all ADl's at the facility.  Employment status:  Disabled (Comment on whether or not currently receiving Disability) Insurance information:  Medicare PT Recommendations:  Not assessed at this time Information / Referral to community resources:     Patient/Family's Response to care: Pt accepting of care.  Patient/Family's Understanding of and Emotional Response to Diagnosis, Current Treatment, and Prognosis: It appears pt can understand diagnosis and treatment recommendations. No emotional distress identified.  Emotional Assessment Appearance:  Appears stated age Attitude/Demeanor/Rapport:  Engaged Affect (typically  observed):  Pleasant Orientation:  Oriented to Self, Oriented to Place Alcohol / Substance use:  Not Applicable Psych involvement (Current and /or in the community):  No (Comment)  Discharge Needs  Concerns to be addressed:  Discharge Planning Concerns Readmission within the last 30 days:  No Current discharge risk:  None Barriers to Discharge:  No Barriers Identified   Shade Flood, LCSW 04/13/2018, 12:10 PM

## 2018-04-13 NOTE — Progress Notes (Signed)
PT Cancellation Note  Patient Details Name: Deborah Maddox MRN: [id number] DOB: January 08, 1956   Cancelled Treatment:    Reason Eval/Treat Not Completed: Other (comment);Medical issues which prohibited therapy. Diastolic BP very high, will try again at another time.   Ramond Dial 04/13/2018, 6:57 PM   6:58 PM, 04/13/18 Mee Hives, PT, MS Physical Therapist - [place] [phone] (Office)

## 2018-04-13 NOTE — Progress Notes (Signed)
PROGRESS NOTE    Deborah Maddox  IEP:[id number] DOB: 07/02/1956 DOA: 04/11/2018 PCP: Megan Mans, NP     Brief Narrative:  62 y/o with PMH of CVA, atrial fibrillation, HTN, HLD and prior hx of seizures. Presented to ED for further evaluation and AMS. Patient found with soft BP and with concerns for UTI.   Assessment & Plan: 1-UTI: sepsis rule out. -patient afebrile and hemodynamically stable -final cultures pending -will transition antibiotics to PO -follow clinical response -instructed to keep herself well hydrated  2-toxic encephalopathy  -most likely associated with UTI. -complete antibiotic therapy -mentation is back to baseline   3-Hypertension -overall stable -will slowly restart antihypertensive agents.  -heart healthy diet ordered   4-OSA -patient is not compliant with CPAP -will ask CM to assist with outpatient needs. -will require repeat sleep study and then new machine -RT to place patient on CPAP while inpatient at bedtime  5-Atrial fibrillation ([place]) -resume cardizem -monitor on telemetry  -resume coumadin  6-supratherapeutic INR -pharmacy assisting with coumadin dose -after vit K INR therapeutic now -follow trend   7-HLD -continue lipitor   8-depression -continue cymbalta -no SI or hallucinations  9-hx of Seizures ([place]) -no seizure activity appreciated -continue topamax and neurontin   10-morbid obesity -Body mass index is 50.61 kg/m. -low calorie diet and portion control discussed with patient  11-GERD -continue PPI  DVT prophylaxis: coumadin  Code Status: Full Code.  Family Communication: no family at bedside  Disposition Plan: will transfer to telemetry bed. Will ask PT to assess, transition antibiotics to PO and follow cultures results. RT to assist with CPAP QHS.  Consultants:   None   Procedures:   See below for x-ray reports.   Antimicrobials:  Anti-infectives (From admission, onward)   Start     Dose/Rate Route  Frequency Ordered Stop   04/13/18 1100  cefdinir (OMNICEF) capsule 300 mg     300 mg Oral Every 12 hours 04/13/18 1048 04/16/18 0959   04/12/18 1000  ceFEPIme (MAXIPIME) 1 g in sodium chloride 0.9 % 100 mL IVPB  Status:  Discontinued     1 g 200 mL/hr over 30 Minutes Intravenous Every 8 hours 04/12/18 0919 04/13/18 1048   04/12/18 0030  ceFEPIme (MAXIPIME) 2 g in sodium chloride 0.9 % 100 mL IVPB     2 g 200 mL/hr over 30 Minutes Intravenous  Once 04/12/18 0021 04/12/18 0059   04/11/18 2200  cefTRIAXone (ROCEPHIN) 1 g in sodium chloride 0.9 % 100 mL IVPB  Status:  Discontinued     1 g 200 mL/hr over 30 Minutes Intravenous Every 24 hours 04/11/18 2152 04/12/18 0020       Subjective: Afebrile, no CP, no SOB. denies nausea and vomiting. Reports feeling weak and tired.  Objective: Vitals:   04/13/18 0752 04/13/18 0800 04/13/18 0900 04/13/18 1000  BP:  133/84    Pulse: (!) 105 97 98 95  Resp: 18 14 15 16   Temp: 98.4 F (36.9 C)     TempSrc: Axillary     SpO2: 98% 98% 100% 99%  Weight:      Height:        Intake/Output Summary (Last 24 hours) at 04/13/2018 1048 Last data filed at 04/13/2018 1000 Gross per 24 hour  Intake 2254.16 ml  Output 4650 ml  Net -2395.84 ml   Filed Weights   04/12/18 0248 04/12/18 0500 04/13/18 0500  Weight: 130.1 kg 130.1 kg 129.6 kg    Examination: General exam: Alert, awake,  oriented x 3. Denies CP, no SOB, no nausea, no vomiting. Patient feeling tire and weak.  Respiratory system: Clear to auscultation. Respiratory effort normal. Cardiovascular system: Irregular, No murmurs, rubs, gallops. Gastrointestinal system: Abdomen is nondistended, soft and nontender. No organomegaly or masses felt. Normal bowel sounds heard. Central nervous system: Alert and oriented. No new focal neurological deficits. Patient with stable chronic left side hemiparesis. Extremities: No Cyanosis, no clubbing. Positive trace edema bilaterally (L > R) Skin: No petechiae, no  erythema, no open wounds. Patient with bruise and knot on her right mid abd (after lovenox shots in outpatient setting). Psychiatry: Judgement and insight appear normal. Mood & affect appropriate.    Data Reviewed: I have personally reviewed following labs and imaging studies  CBC: Recent Labs  Lab 04/11/18 2200 04/12/18 0502 04/13/18 0402  WBC 8.5 6.9 10.6*  NEUTROABS 3.8 3.1 6.8  HGB 10.4* 11.3* 13.0  HCT 33.2* 36.5 40.9  MCV 96.2 97.1 97.8  PLT 299 278 254   Basic Metabolic Panel: Recent Labs  Lab 04/11/18 2200 04/12/18 0502 04/13/18 0402  NA 141 144 141  K 3.5 4.0 3.7  CL 114* 118* 114*  CO2 22 19* 21*  GLUCOSE 135* 118* 116*  BUN 17 14 8   CREATININE 0.75 0.56 0.47  CALCIUM 8.0* 7.8* 8.2*  MG 1.7  --   --   PHOS 4.6  --   --    GFR: Estimated Creatinine Clearance: 93.4 mL/min (by C-G formula based on SCr of 0.47 mg/dL).   Liver Function Tests: Recent Labs  Lab 04/11/18 2200  AST 13*  ALT 11  ALKPHOS 71  BILITOT 0.5  PROT 6.1*  ALBUMIN 3.2*   Coagulation Profile: Recent Labs  Lab 04/12/18 0054 04/13/18 0402  INR 9.65* 2.24   Cardiac Enzymes: Recent Labs  Lab 04/12/18 0054  TROPONINI <0.03   Urine analysis:    Component Value Date/Time   COLORURINE YELLOW 04/11/2018 2240   APPEARANCEUR CLOUDY (A) 04/11/2018 2240   LABSPEC 1.008 04/11/2018 2240   PHURINE 5.0 04/11/2018 2240   GLUCOSEU NEGATIVE 04/11/2018 2240   HGBUR LARGE (A) 04/11/2018 2240   BILIRUBINUR NEGATIVE 04/11/2018 2240   KETONESUR NEGATIVE 04/11/2018 2240   PROTEINUR NEGATIVE 04/11/2018 2240   UROBILINOGEN 0.2 05/31/2015 0235   NITRITE NEGATIVE 04/11/2018 2240   LEUKOCYTESUR TRACE (A) 04/11/2018 2240    Recent Results (from the past 240 hour(s))  Blood Culture (routine x 2)     Status: None (Preliminary result)   Collection Time: 04/11/18 10:00 PM  Result Value Ref Range Status   Specimen Description BLOOD LEFT HAND  Final   Special Requests   Final    BOTTLES DRAWN  AEROBIC AND ANAEROBIC Blood Culture adequate volume   Culture   Final    NO GROWTH 2 DAYS Performed at [facility], [address] [place], [place] [address]    Report Status PENDING  Incomplete  Blood Culture (routine x 2)     Status: None (Preliminary result)   Collection Time: 04/11/18 10:20 PM  Result Value Ref Range Status   Specimen Description BLOOD LEFT FOREARM  Final   Special Requests   Final    Blood Culture results may not be optimal due to an excessive volume of blood received in culture bottles   Culture   Final    NO GROWTH 2 DAYS Performed at [facility], [address], [place], [place] [address]    Report Status PENDING  Incomplete  MRSA PCR  Screening     Status: Abnormal   Collection Time: 04/12/18  2:31 AM  Result Value Ref Range Status   MRSA by PCR POSITIVE (A) NEGATIVE Final    Comment:        The GeneXpert MRSA Assay (FDA approved for NASAL specimens only), is one component of a comprehensive MRSA colonization surveillance program. It is not intended to diagnose MRSA infection nor to guide or monitor treatment for MRSA infections. RESULT CALLED TO, READ BACK BY AND VERIFIED WITH: PINNIX H. AT [id number] ON [id number] BY THOMPSON S. Performed at [facility], [address] [place], [place] [address]     Radiology Studies: Ct Head Wo Contrast  Result Date: 04/12/2018 CLINICAL DATA:  62 year old female with altered mental status. EXAM: CT HEAD WITHOUT CONTRAST TECHNIQUE: Contiguous axial images were obtained from the base of the skull through the vertex without intravenous contrast. COMPARISON:  Head CT dated 12/25/2017 FINDINGS: Brain: Large right MCA territory or infarct and encephalomalacia. There is associated mild ex vacuo dilatation of the right lateral ventricle. There is no acute intracranial hemorrhage. No mass effect or midline shift. No extra-axial fluid collection. Vascular: No hyperdense vessel or unexpected calcification. Skull: Normal. Negative for  fracture or focal lesion. Sinuses/Orbits: No acute finding. Other: None IMPRESSION: 1. No acute intracranial hemorrhage. 2. Large right MCA territory old infarct and encephalomalacia. Electronically Signed   By: Anner Crete M.D.   On: 04/12/2018 04:51   Dg Chest Port 1 View  Result Date: 04/11/2018 CLINICAL DATA:  Altered mental status, somnolent. EXAM: PORTABLE CHEST 1 VIEW COMPARISON:  Chest radiograph Dec 25, 2017 FINDINGS: Stable cardiomegaly. Pulmonary vascular congestion without pleural effusion or focal consolidation. No pneumothorax. Osteopenia. Large body habitus. IMPRESSION: Stable cardiomegaly and pulmonary vascular congestion. Electronically Signed   By: Elon Alas M.D.   On: 04/11/2018 22:26    Scheduled Meds: . atorvastatin  10 mg Oral QHS  . cefdinir  300 mg Oral Q12H  . docusate sodium  100 mg Oral BID  . DULoxetine  60 mg Oral Daily  . fluticasone  2 spray Each Nare Daily  . gabapentin  300 mg Oral BID WC  . gabapentin  900 mg Oral QHS  . linaclotide  290 mcg Oral QAC breakfast  . loratadine  10 mg Oral Daily  . montelukast  10 mg Oral Daily  . oxybutynin  5 mg Oral BID  . pantoprazole  40 mg Oral Daily  . pneumococcal 23 valent vaccine  0.5 mL Intramuscular Tomorrow-1000  . senna  17.2 mg Oral Daily  . topiramate  100 mg Oral QHS  . topiramate  50 mg Oral Daily  . vitamin B-12  500 mcg Oral Daily  . Warfarin - Pharmacist Dosing Inpatient   Does not apply Q24H   Continuous Infusions: . sodium chloride 125 mL/hr at 04/12/18 2019     LOS: 1 day    Time spent: 30 minutes   Barton Dubois, MD [facility] Pager [phone]  If 7PM-7AM, please contact night-coverage [email or web address] Password [email or web address] 04/13/2018, 10:48 AM

## 2018-04-14 DIAGNOSIS — E66813 Obesity, class 3: Secondary | ICD-10-CM

## 2018-04-14 DIAGNOSIS — N390 Urinary tract infection, site not specified: Secondary | ICD-10-CM

## 2018-04-14 LAB — BASIC METABOLIC PANEL
Anion gap: 9 (ref 5–15)
BUN: 12 mg/dL (ref 8–23)
CALCIUM: 8.4 mg/dL — AB (ref 8.9–10.3)
CO2: 22 mmol/L (ref 22–32)
CREATININE: 0.66 mg/dL (ref 0.44–1.00)
Chloride: 110 mmol/L (ref 98–111)
GFR calc non Af Amer: >60 mL/min (ref >60)
Glucose, Bld: 121 mg/dL — ABNORMAL HIGH (ref 70–99)
Potassium: 3.7 mmol/L (ref 3.5–5.1)
SODIUM: 141 mmol/L (ref 135–145)

## 2018-04-14 LAB — CBC WITH DIFFERENTIAL/PLATELET
Basophils Absolute: 0.1 10*3/uL (ref 0.0–0.1)
Basophils Relative: 1 %
EOS ABS: 0.2 10*3/uL (ref 0.0–0.7)
EOS PCT: 2 %
HCT: 37.4 % (ref 36.0–46.0)
Hemoglobin: 11.9 g/dL — ABNORMAL LOW (ref 12.0–15.0)
Lymphocytes Relative: 29 %
Lymphs Abs: 2.9 10*3/uL (ref 0.7–4.0)
MCH: 30.4 pg (ref 26.0–34.0)
MCHC: 31.8 g/dL (ref 30.0–36.0)
MCV: 95.7 fL (ref 78.0–100.0)
MONO ABS: 1.4 10*3/uL — AB (ref 0.1–1.0)
Monocytes Relative: 14 %
Neutro Abs: 5.4 10*3/uL (ref 1.7–7.7)
Neutrophils Relative %: 55 %
Platelets: 328 10*3/uL (ref 150–400)
RBC: 3.91 MIL/uL (ref 3.87–5.11)
RDW: 15.1 % (ref 11.5–15.5)
WBC: 9.9 10*3/uL (ref 4.0–10.5)

## 2018-04-14 LAB — PROTIME-INR
INR: 2.02
PROTHROMBIN TIME: 22.7 s — AB (ref 11.4–15.2)

## 2018-04-14 MED ORDER — WARFARIN SODIUM 5 MG PO TABS
5.0000 mg | ORAL_TABLET | Freq: Once | ORAL | Status: AC
  Administered 2018-04-14: 5 mg via ORAL
  Filled 2018-04-14: qty 1

## 2018-04-14 MED ORDER — DILTIAZEM HCL ER COATED BEADS 300 MG PO CP24
300.0000 mg | ORAL_CAPSULE | Freq: Every day | ORAL | Status: DC
  Administered 2018-04-14: 300 mg via ORAL
  Filled 2018-04-14: qty 1

## 2018-04-14 MED ORDER — CEFDINIR 300 MG PO CAPS: 300.0000 mg | ORAL_CAPSULE | Freq: Two times a day (BID) | ORAL | 0 refills | Status: AC

## 2018-04-14 NOTE — Clinical Social Work Note (Signed)
Pt stable for dc today per MD. Met with pt to discuss dc to SNF rehab vs ALF with HHC PT. Pt states that she wants to go back to the ALF. [facility] staff indicate pt can return today. RN CM updated and she will assist with [facility] referral. Pt will need EMS transport. RN has already called report and she will call EMS once pt ready. There are no other CSW needs for dc.

## 2018-04-14 NOTE — Progress Notes (Addendum)
Discharge instructions provided to pt. Pt expresses understanding. [facility] assisted living updated and are aware of pt's discharge from hospital. EMS called. Unknown ETA of [facility]

## 2018-04-14 NOTE — Progress Notes (Signed)
[facility] in to transport pt to [facility].

## 2018-04-14 NOTE — NC FL2 (Signed)
[place] MEDICAID FL2 LEVEL OF CARE SCREENING TOOL     IDENTIFICATION  Patient Name: Deborah Maddox Birthdate: 10/21/1955 Sex: female Admission Date (Current Location): 04/11/2018  [facility] and [place] Number:  [employer] and Address:  [place] [address] [place]      Provider Number: [id number]  Attending Physician Name and Address:  Barton Dubois, MD  Relative Name and Phone Number:       Current Level of Care: Hospital Recommended Level of Care: [place] Prior Approval Number:    Date Approved/Denied:   PASRR Number:    Discharge Plan: Other (Comment)(ALF)    Current Diagnoses: Patient Active Problem List   Diagnosis Date Noted  . Urinary tract infection without hematuria   . Obesity, Class III, BMI 40-49.9 (morbid obesity) ([place])   . Sepsis due to undetermined organism ([place]) 04/12/2018  . Anemia 04/12/2018  . Altered mental status 04/12/2018  . Supratherapeutic INR 04/12/2018  . H/O adenomatous polyp of colon 01/07/2018  . Lower back pain 09/28/2017  . Seizures ([place]) 09/28/2017  . MRSA (methicillin resistant staph aureus) positive 09/28/2017  . Cellulitis of abdominal wall 09/27/2017  . Sleep apnea 09/27/2017  . Anxiety 09/27/2017  . Coronary artery disease 09/27/2017  . Foot drop 06/01/2017  . Encounter for therapeutic drug monitoring 09/14/2013  . Long term (current) use of anticoagulants 12/14/2012  . Atrial fibrillation ([place]) 12/14/2012  . Acute pulmonary embolism ([place]) 12/05/2012  . Atrial flutter ([place]) 12/05/2012  . Constipation 09/28/2012  . Rectal bleeding 09/28/2012  . Heart palpitations 02/01/2012  . Hypertension 02/01/2012  . Chest pain at rest 02/01/2012    Orientation RESPIRATION BLADDER Height & Weight     Self, Time, Situation, Place  Other (Comment)(CPAP at night) Continent Weight: 279 lb 8.7 oz (126.8 kg) Height:  5\' 3"  (160 cm)  BEHAVIORAL SYMPTOMS/MOOD NEUROLOGICAL BOWEL  NUTRITION STATUS      Continent Diet  AMBULATORY STATUS COMMUNICATION OF NEEDS Skin   Extensive Assist Verbally Normal                       Personal Care Assistance Level of Assistance  Bathing, Feeding, Dressing Bathing Assistance: Maximum assistance Feeding assistance: Independent Dressing Assistance: Maximum assistance     Functional Limitations Info  Sight, Hearing, Speech Sight Info: Adequate Hearing Info: Adequate Speech Info: Adequate    SPECIAL CARE FACTORS FREQUENCY                       Contractures Contractures Info: Not present    Additional Factors Info  Code Status, Allergies Code Status Info: full Allergies Info: Vancomycin, Latex           Current Medications (04/14/2018):  This is the current hospital active medication list Current Facility-Administered Medications  Medication Dose Route Frequency Provider Last Rate Last Dose  . 0.9 %  sodium chloride infusion   Intravenous Continuous Barton Dubois, MD 75 mL/hr at 04/13/18 1756    . acetaminophen (TYLENOL) tablet 650 mg  650 mg Oral Q6H PRN Reubin Milan, MD       Or  . acetaminophen (TYLENOL) suppository 650 mg  650 mg Rectal Q6H PRN Reubin Milan, MD      . atorvastatin (LIPITOR) tablet 10 mg  10 mg Oral QHS Reubin Milan, MD   10 mg at 04/13/18 2128  . cefdinir (OMNICEF) capsule 300 mg  300 mg  Oral Wynelle Bourgeois, MD   300 mg at 04/14/18 0954  . diltiazem (CARDIZEM CD) 24 hr capsule 300 mg  300 mg Oral Daily Barton Dubois, MD   300 mg at 04/14/18 1053  . docusate sodium (COLACE) capsule 100 mg  100 mg Oral BID Reubin Milan, MD   100 mg at 04/14/18 0955  . DULoxetine (CYMBALTA) DR capsule 60 mg  60 mg Oral Daily Reubin Milan, MD   60 mg at 04/14/18 0955  . fluticasone (FLONASE) 50 MCG/ACT nasal spray 2 spray  2 spray Each Nare Daily Reubin Milan, MD   2 spray at 04/14/18 0957  . gabapentin (NEURONTIN) capsule 300 mg  300 mg Oral BID WC Reubin Milan, MD   300 mg at 04/14/18 0751  . gabapentin (NEURONTIN) capsule 900 mg  900 mg Oral QHS Reubin Milan, MD   900 mg at 04/13/18 2128  . linaclotide (LINZESS) capsule 290 mcg  290 mcg Oral QAC breakfast Reubin Milan, MD   290 mcg at 04/14/18 0749  . loratadine (CLARITIN) tablet 10 mg  10 mg Oral Daily Reubin Milan, MD   10 mg at 04/14/18 0953  . montelukast (SINGULAIR) tablet 10 mg  10 mg Oral Daily Reubin Milan, MD   10 mg at 04/14/18 0956  . ondansetron (ZOFRAN) tablet 4 mg  4 mg Oral Q6H PRN Reubin Milan, MD   4 mg at 04/12/18 2221   Or  . ondansetron [facility]) injection 4 mg  4 mg Intravenous Q6H PRN Reubin Milan, MD      . oxybutynin [facility]) tablet 5 mg  5 mg Oral BID Reubin Milan, MD   5 mg at 04/14/18 0955  . pantoprazole (PROTONIX) EC tablet 40 mg  40 mg Oral Daily Reubin Milan, MD   40 mg at 04/14/18 0957  . pneumococcal 23 valent vaccine (PNU-IMMUNE) injection 0.5 mL  0.5 mL Intramuscular Tomorrow-1000 Reubin Milan, MD      . senna [facility]) tablet 17.2 mg  17.2 mg Oral Daily Reubin Milan, MD   17.2 mg at 04/14/18 0954  . topiramate (TOPAMAX) tablet 100 mg  100 mg Oral QHS Reubin Milan, MD   100 mg at 04/13/18 2127  . topiramate (TOPAMAX) tablet 50 mg  50 mg Oral Daily Reubin Milan, MD   50 mg at 04/14/18 0955  . vitamin B-12 (CYANOCOBALAMIN) tablet 500 mcg  500 mcg Oral Daily Reubin Milan, MD   500 mcg at 04/14/18 [phone]  . warfarin (COUMADIN) tablet 5 mg  5 mg Oral Once Barton Dubois, MD      . Warfarin - Pharmacist Dosing Inpatient   Does not apply Q24H Heath Lark D, DO         Discharge Medications:  STOP taking these medications   enoxaparin 120 MG/0.8ML injection Commonly known as:  LOVENOX   polyethylene glycol-electrolytes 420 g solution Commonly known as:  NuLYTELY/GoLYTELY     TAKE these medications   atorvastatin 10 MG tablet Commonly known as:  LIPITOR Take  10 mg by mouth at bedtime.   cefdinir 300 MG capsule Commonly known as:  OMNICEF Take 1 capsule (300 mg total) by mouth every 12 (twelve) hours for 2 days.   cyclobenzaprine 10 MG tablet Commonly known as:  FLEXERIL Take 10 mg by mouth 3 (three) times daily as needed (headaches).   diclofenac sodium 1 % Gel Commonly known as:  VOLTAREN Apply 2-4 g topically 3 (three) times daily. to knees   diltiazem 300 MG 24 hr capsule Commonly known as:  TIAZAC Take 300 mg by mouth daily.   docusate sodium 100 MG capsule Commonly known as:  COLACE Take 100 mg by mouth 2 (two) times daily.   DULoxetine 60 MG capsule Commonly known as:  CYMBALTA Take 60 mg by mouth daily.   fluticasone 50 MCG/ACT nasal spray Commonly known as:  FLONASE Place 2 sprays into both nostrils daily.   gabapentin 300 MG capsule Commonly known as:  NEURONTIN Take 300-900 mg by mouth See admin instructions. Take 300 mg by mouth every morning and evening and 900 mg at bedtime   linaclotide 290 MCG Caps capsule Commonly known as:  LINZESS Take 1 capsule by mouth daily. 30 minutes before breakfast.   lisinopril 40 MG tablet Commonly known as:  PRINIVIL,ZESTRIL Take 40 mg by mouth daily.   loratadine 10 MG tablet Commonly known as:  CLARITIN Take 10 mg by mouth daily.   montelukast 10 MG tablet Commonly known as:  SINGULAIR Take 10 mg by mouth daily.   omeprazole 20 MG capsule Commonly known as:  PRILOSEC Take 20 mg by mouth daily.   oxybutynin 5 MG tablet Commonly known as:  DITROPAN Take 5 mg by mouth 2 (two) times daily.   senna 8.6 MG Tabs tablet Commonly known as:  SENOKOT Take 17.2 mg by mouth daily.   topiramate 50 MG tablet Commonly known as:  TOPAMAX Take 50-100 mg by mouth See admin instructions. Take 50 mg in the morning and 100 mg at bedtime   vitamin B-12 500 MCG tablet Commonly known as:  CYANOCOBALAMIN Take 500 mcg by mouth daily.   Vitamin D 2000 units  Caps Take 2,000 Units by mouth daily. At 1300   warfarin 2.5 MG tablet Commonly known as:  COUMADIN Take as directed. If you are unsure how to take this medication, talk to your nurse or doctor. Original instructions:  Take 1 tablet (2.5mg ) daily except 2 tablets (5mg ) on Mondays and Thursdays What changed:  See the new instructions.      Relevant Imaging Results:  Relevant Lab Results:   Additional Information    Shade Flood, LCSW

## 2018-04-14 NOTE — Progress Notes (Signed)
Deborah Maddox for Warfarin Indication: atrial fibrillation  Allergies  Allergen Reactions  . Vancomycin Hives and Itching  . Latex Rash  . Other Rash    HEART  MONITOR TAPE: severe rash, bleeding   Patient Measurements: Height: 5\' 3"  (160 cm) Weight: 279 lb 8.7 oz (126.8 kg) IBW/kg (Calculated) : 52.4  Vital Signs: Temp: 99.3 F (37.4 C) (09/05 0400) Temp Source: Oral (09/05 0400) BP: 154/96 (09/05 0720) Pulse Rate: 116 (09/05 0720)  Labs: Recent Labs    04/12/18 0054 04/12/18 0502 04/13/18 0402 04/14/18 0430  HGB  --  11.3* 13.0 11.9*  HCT  --  36.5 40.9 37.4  PLT  --  278 222 328  LABPROT 77.0*  --  24.6* 22.7*  INR 9.65*  --  2.24 2.02  CREATININE  --  0.56 0.47 0.66  TROPONINI <0.03  --   --   --    Estimated Creatinine Clearance: 92.2 mL/min (by C-G formula based on SCr of 0.66 mg/dL).  Medical History: Past Medical History:  Diagnosis Date  . A-fib ([place])   . Anxiety   . Arthritis   . Cancer (HCC)    cervical  . 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 Coronary artery disease   . Foot drop    left foot  . Hypertension   . Left foot drop   . 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 PONV (postoperative nausea and vomiting)   . Pulmonary emboli ([place]) 2014  . Seizures ([place])    last one 10 years ago  . Sleep apnea    Refuses to use machine because of recurrent pneumonia  . Stroke [facility])    at age 66, left sided weakness-complete paralysis of left arm.   Medications:  Medications Prior to Admission  Medication Sig Dispense Refill Last Dose  . atorvastatin (LIPITOR) 10 MG tablet Take 10 mg by mouth at bedtime.   04/10/2018 at Unknown time  . Cholecalciferol (VITAMIN D) 2000 UNITS CAPS Take 2,000 Units by mouth daily. At 1300   04/11/2018 at Unknown time  . diltiazem (TIAZAC) 300 MG 24 hr capsule Take 300 mg by mouth daily.   04/11/2018 at Unknown time  . docusate sodium (COLACE) 100 MG capsule Take 100 mg  by mouth 2 (two) times daily.    04/11/2018 at Unknown time  . DULoxetine (CYMBALTA) 60 MG capsule Take 60 mg by mouth daily.     04/11/2018 at Unknown time  . fluticasone (FLONASE) 50 MCG/ACT nasal spray Place 2 sprays into both nostrils daily.   04/11/2018 at Unknown time  . gabapentin (NEURONTIN) 300 MG capsule Take 300-900 mg by mouth See admin instructions. Take 300 mg by mouth every morning and evening and 900 mg at bedtime   04/11/2018 at Unknown time  . Linaclotide (LINZESS) 290 MCG CAPS Take 1 capsule by mouth daily. 30 minutes before breakfast. 30 capsule 3 04/11/2018 at Unknown time  . lisinopril (PRINIVIL,ZESTRIL) 40 MG tablet Take 40 mg by mouth daily.     04/11/2018 at Unknown time  . loratadine (CLARITIN) 10 MG tablet Take 10 mg by mouth daily.   04/11/2018 at Unknown time  . montelukast (SINGULAIR) 10 MG tablet Take 10 mg by mouth daily.   04/11/2018 at Unknown time  . omeprazole (PRILOSEC) 20 MG capsule Take 20 mg by mouth daily.     04/11/2018 at Unknown time  . oxybutynin (DITROPAN)  5 MG tablet Take 5 mg by mouth 2 (two) times daily.   04/11/2018 at Unknown time  . senna (SENOKOT) 8.6 MG TABS Take 17.2 mg by mouth daily.    04/11/2018 at Unknown time  . topiramate (TOPAMAX) 50 MG tablet Take 50-100 mg by mouth See admin instructions. Take 50 mg in the morning and 100 mg at bedtime   04/11/2018 at Unknown time  . vitamin B-12 (CYANOCOBALAMIN) 500 MCG tablet Take 500 mcg by mouth daily.   04/11/2018 at Unknown time  . cyclobenzaprine (FLEXERIL) 10 MG tablet Take 10 mg by mouth 3 (three) times daily as needed (headaches).   03/16/2018 at Unknown time  . diclofenac sodium (VOLTAREN) 1 % GEL Apply 2-4 g topically 3 (three) times daily. to knees   03/16/2018 at Unknown time  . enoxaparin (LOVENOX) 120 MG/0.8ML injection Inject 0.8 mLs (120 mg total) into the skin daily. At 8am 10 Syringe 0 03/16/2018 at Unknown time  . polyethylene glycol-electrolytes (TRILYTE) 420 g solution Take 4,000 mLs by mouth as directed. 4000  mL 0 03/17/2018 at Unknown time   Assessment: Elevated INR on admission. Vit K 5mg  IV given 9/3 at 0315. INR within therapeutic range this AM at 2.02.  Home regimen 5mg  Monday and Thursday; 2.5mg  all other days.   Goal of Therapy:  INR 2-3   Plan:  Coumadin 5mg  po x 1 today PT-INR daily Monitor for signs and symptoms of bleeding.    Margot Ables, PharmD Clinical Pharmacist 04/14/2018 8:37 AM

## 2018-04-14 NOTE — Evaluation (Signed)
Physical Therapy Evaluation Patient Details Name: Deborah Maddox MRN: [id number] DOB: 10-04-1955 Today's Date: 04/14/2018   History of Present Illness  62 yo female with onset of sepsis from recurrent UTI, admitted in [place] with toxic encephalopathy, pulm vasc congestion, cardiomegaly.  PMHx:  encephalomalacia, R MCA stroke history with seizures, HTN, OSA, GERD, morbid obesity, a-fib  Clinical Impression  Pt was seen for evaluation of mobilty and was not able to stand due to L side weakness and her pulses being up to 158 in just sitting.  Will progress to chair and standing as able but recommend SNF to give her a safe environment to recover her previous strength and abilities.  Follow acutely for strengthening and standing transfers, as well as monitoring of vitals with therapy.    Follow Up Recommendations SNF    Equipment Recommendations  None recommended by PT    Recommendations for Other Services       Precautions / Restrictions Precautions Precautions: Fall(telemetry) Precaution Comments: monitor HR Restrictions Weight Bearing Restrictions: No      Mobility  Bed Mobility Overal bed mobility: Needs Assistance Bed Mobility: Supine to Sit;Sit to Supine     Supine to sit: Mod assist Sit to supine: Max assist   General bed mobility comments: pt can assist with RUE on bed rail with hob elevated to get out but doesn't assist scooting, max to pivot back to bed  Transfers                 General transfer comment: not attempted due to her pulse being 158  Ambulation/Gait             General Gait Details: unable to assess  Stairs            Wheelchair Mobility    Modified Rankin (Stroke Patients Only) Modified Rankin (Stroke Patients Only) Pre-Morbid Rankin Score: Moderate disability Modified Rankin: Severe disability     Balance Overall balance assessment: Needs assistance Sitting-balance support: Feet supported;Single extremity supported Sitting  balance-Leahy Scale: Fair Sitting balance - Comments: pt was struggling to control with L side not fully participating Postural control: Right lateral lean                                   Pertinent Vitals/Pain Pain Assessment: No/denies pain    Home Living Family/patient expects to be discharged to:: Skilled nursing facility                 Additional Comments: ALF resident with [facility] mobility but was mod I to transfer to the chair previous to this stay    Prior Function Level of Independence: Needs assistance   Gait / Transfers Assistance Needed: Ambulate short household distances with quad-cane and LLE AFO, uses wheelchair provided by facility most of time  ADL's / Homemaking Assistance Needed: showers and care assisted by ALF        Hand Dominance   Dominant Hand: Right    Extremity/Trunk Assessment   Upper Extremity Assessment Upper Extremity Assessment: LUE deficits/detail LUE Deficits / Details: dense L UE weakness LUE Coordination: decreased fine motor;decreased gross motor    Lower Extremity Assessment Lower Extremity Assessment: LLE deficits/detail LLE Deficits / Details: weakness from stroke with poor ROM and ability to assist LLE Coordination: decreased fine motor;decreased gross motor    Cervical / Trunk Assessment Cervical / Trunk Assessment: Normal  Communication   Communication: No difficulties  Cognition Arousal/Alertness: Awake/alert Behavior During Therapy: Impulsive Overall Cognitive Status: No family/caregiver present to determine baseline cognitive functioning                                 General Comments: pt is not aware of her safety and medical situation although she is in [place] comments (skin integrity, edema, etc.): using purwick in bed    Exercises     Assessment/Plan    PT Assessment Patient needs continued PT services  PT Problem List Decreased strength;Decreased  range of motion;Decreased activity tolerance;Decreased balance;Decreased mobility;Decreased coordination;Decreased cognition;Decreased safety awareness;Cardiopulmonary status limiting activity;Obesity;Decreased skin integrity       PT Treatment Interventions DME instruction;Gait training;Functional mobility training;Therapeutic activities;Therapeutic exercise;Balance training;Neuromuscular re-education;Patient/family education    PT Goals (Current goals can be found in the Care Plan section)  Acute Rehab PT Goals Patient Stated Goal: to get home and get stronger PT Goal Formulation: With patient Time For Goal Achievement: 04/28/18 Potential to Achieve Goals: Good    Frequency Min 2X/week   Barriers to discharge Other (comment)(in ALF but requires 2 person support to transfer)      Co-evaluation               AM-PAC PT "6 Clicks" Daily Activity  Outcome Measure Difficulty turning over in bed (including adjusting bedclothes, sheets and blankets)?: Unable Difficulty moving from lying on back to sitting on the side of the bed? : Unable Difficulty sitting down on and standing up from a chair with arms (e.g., wheelchair, bedside commode, etc,.)?: Unable Help needed moving to and from a bed to chair (including a wheelchair)?: Total Help needed walking in hospital room?: Total Help needed climbing 3-5 steps with a railing? : Total 6 Click Score: 6    End of Session   Activity Tolerance: Patient limited by fatigue;Treatment limited secondary to medical complications (Comment) Patient left: in bed;with call bell/phone within reach;with bed alarm set Nurse Communication: Mobility status PT Visit Diagnosis: Other abnormalities of gait and mobility (R26.89);Muscle weakness (generalized) (M62.81);Difficulty in walking, not elsewhere classified (R26.2);Hemiplegia and hemiparesis Hemiplegia - Right/Left: Left Hemiplegia - dominant/non-dominant: Non-dominant Hemiplegia - caused by:  Cerebral infarction    Time: 5364-6803 PT Time Calculation (min) (ACUTE ONLY): 23 min   Charges:   PT Evaluation $PT Eval Moderate Complexity: 1 Mod PT Treatments $Therapeutic Activity: 8-22 mins       Ramond Dial 04/14/2018, 10:07 AM  10:11 AM, 04/14/18 Mee Hives, PT, MS Physical Therapist - [place] [phone] (Office)

## 2018-04-14 NOTE — Discharge Summary (Signed)
Physician Discharge Summary  Deborah Maddox HQI:[id number] DOB: [birth year] DOA: 64/09/2017  PCP: Megan Mans, NP  Admit date: 04/11/2018 Discharge date: 04/14/2018  Time spent: 35 minutes  Recommendations for Outpatient Follow-up:  1. Repeat basic metabolic panel to follow electrolytes and renal function. 2. Reassess blood pressure and further adjust antihypertensive regimen as needed. 3. Final culture results just in case results prove microorganism to be resistant to antibiotic chosen.  Discharge Diagnoses:  Principal Problem:   Sepsis due to undetermined organism [facility]) Active Problems:   Hypertension   Atrial fibrillation (HCC)   Sleep apnea   Coronary artery disease   Seizures (HCC)   Anemia   Altered mental status   Supratherapeutic INR   Urinary tract infection without hematuria   Obesity, Class III, BMI 40-49.9 (morbid obesity) ([place])   Discharge Condition: Stable and improved.  Discharge back home (assisted living facility) with instruction to follow-up with PCP in 10 days.  Diet recommendation: Heart healthy diet and low-calorie.  Filed Weights   04/12/18 0500 04/13/18 0500 04/14/18 0400  Weight: 130.1 kg 129.6 kg 126.8 kg    History of present illness:  62 y/o with PMH of CVA, atrial fibrillation, HTN, HLD and prior hx of seizures. Presented to ED for further evaluation and AMS. Patient found with soft BP and with concerns for UTI.   Hospital Course:  1-UTI: sepsis rule out; patient with uncomplicated UTI. -patient afebrile and hemodynamically stable. -final cultures pending at discharge -patient denies dysuria, hematuria and frequency. -complete antibiotics therapy as instructed (2 more days Pending at discharge) -instructed to keep herself well hydrated  2-toxic encephalopathy  -most likely associated with UTI. -complete antibiotic therapy as outlined above.  -mentation is back to baseline  -also concerns of underlying hypercapnia   3-Hypertension -heart  healthy diet encouraged. -Resume lisinopril and Cardizem. -Patient advised to maintain adequate hydration. -Use of CPAP as an outpatient will also help controlling blood pressure.  4-OSA -patient is not compliant with CPAP -will need to repeat sleep study and then new machine as an outpatient. -tolerated well and felt refresh after using CPAP in the hospital.  5-Atrial fibrillation ([place]) -continue cardizem -continue coumadin for secondary prevention.  6-supratherapeutic INR -after vit K INR therapeutic now -Appreciate pharmacy's help in assisting with Coumadin dose. -Okay to resume home dose at discharge. -INR 2.02 today. -continue outpatient follow up of coumadin level.   7-HLD -continue lipitor   8-depression -continue cymbalta -no SI or hallucinations -Is a stable.  9-hx of Seizures ([place]) -no seizure activity appreciated -continue topamax and neurontin   10-morbid obesity -Body mass index is 50.61 kg/m. -low calorie diet and portion control discussed with patient  11-GERD -continue PPI  Procedures:  See below for x-ray reports.  Consultations:  None  Discharge Exam: Vitals:   04/14/18 0720 04/14/18 0841  BP: (!) 154/96   Pulse: (!) 116   Resp: 16   Temp:  98.2 F (36.8 C)  SpO2: 98%     General: Afebrile, no chest pain, no shortness of breath, no nausea, no vomiting.  Patient reports that she slept good and is currently oriented x3 and back to her baseline.  She tolerated well with the use of CPAP at bedtime.  Patient denies dysuria. Cardiovascular: Irregular, no rubs, no gallops, no murmurs, unable to properly assess JVD due to body habitus. Respiratory: No crackles, no wheezing, normal respiratory effort, good oxygen saturation on room air. Abdomen: Obese, nontender, nondistended, positive bowel sounds.  Right side with  a very little knot on palpation improved from prior Lovenox shots. Extremities: Trace edema bilaterally (left more than  right), no cyanosis, no clubbing. Neurologic exam: No new focal deficits, left hemiparesis appreciated on exam.  Discharge Instructions   Discharge Instructions    Diet - low sodium heart healthy   Complete by:  As directed    Discharge instructions   Complete by:  As directed    Take medications as prescribed  Keep yourself well hydrated Arrange follow up with PCP in 10 days. Follow heart healthy and low calorie diet.     Allergies as of 04/14/2018      Reactions   Vancomycin Hives, Itching   Latex Rash   Other Rash   HEART  MONITOR TAPE: severe rash, bleeding      Medication List    STOP taking these medications   enoxaparin 120 MG/0.8ML injection Commonly known as:  LOVENOX   polyethylene glycol-electrolytes 420 g solution Commonly known as:  NuLYTELY/GoLYTELY     TAKE these medications   atorvastatin 10 MG tablet Commonly known as:  LIPITOR Take 10 mg by mouth at bedtime.   cefdinir 300 MG capsule Commonly known as:  OMNICEF Take 1 capsule (300 mg total) by mouth every 12 (twelve) hours for 2 days.   cyclobenzaprine 10 MG tablet Commonly known as:  FLEXERIL Take 10 mg by mouth 3 (three) times daily as needed (headaches).   diclofenac sodium 1 % Gel Commonly known as:  VOLTAREN Apply 2-4 g topically 3 (three) times daily. to knees   diltiazem 300 MG 24 hr capsule Commonly known as:  TIAZAC Take 300 mg by mouth daily.   docusate sodium 100 MG capsule Commonly known as:  COLACE Take 100 mg by mouth 2 (two) times daily.   DULoxetine 60 MG capsule Commonly known as:  CYMBALTA Take 60 mg by mouth daily.   fluticasone 50 MCG/ACT nasal spray Commonly known as:  FLONASE Place 2 sprays into both nostrils daily.   gabapentin 300 MG capsule Commonly known as:  NEURONTIN Take 300-900 mg by mouth See admin instructions. Take 300 mg by mouth every morning and evening and 900 mg at bedtime   linaclotide 290 MCG Caps capsule Commonly known as:  LINZESS Take  1 capsule by mouth daily. 30 minutes before breakfast.   lisinopril 40 MG tablet Commonly known as:  PRINIVIL,ZESTRIL Take 40 mg by mouth daily.   loratadine 10 MG tablet Commonly known as:  CLARITIN Take 10 mg by mouth daily.   montelukast 10 MG tablet Commonly known as:  SINGULAIR Take 10 mg by mouth daily.   omeprazole 20 MG capsule Commonly known as:  PRILOSEC Take 20 mg by mouth daily.   oxybutynin 5 MG tablet Commonly known as:  DITROPAN Take 5 mg by mouth 2 (two) times daily.   senna 8.6 MG Tabs tablet Commonly known as:  SENOKOT Take 17.2 mg by mouth daily.   topiramate 50 MG tablet Commonly known as:  TOPAMAX Take 50-100 mg by mouth See admin instructions. Take 50 mg in the morning and 100 mg at bedtime   vitamin B-12 500 MCG tablet Commonly known as:  CYANOCOBALAMIN Take 500 mcg by mouth daily.   Vitamin D 2000 units Caps Take 2,000 Units by mouth daily. At 1300   warfarin 2.5 MG tablet Commonly known as:  COUMADIN Take as directed. If you are unsure how to take this medication, talk to your nurse or doctor. Original instructions:  Take  1 tablet (2.5mg ) daily except 2 tablets (5mg ) on Mondays and Thursdays What changed:  See the new instructions.      Allergies  Allergen Reactions  . Vancomycin Hives and Itching  . Latex Rash  . Other Rash    HEART  MONITOR TAPE: severe rash, bleeding   Follow-up Information    Megan Mans, NP. Schedule an appointment as soon as possible for a visit in 10 day(s).   Specialty:  Nurse Practitioner Contact information: [place] [address] [phone]           The results of significant diagnostics from this hospitalization (including imaging, microbiology, ancillary and laboratory) are listed below for reference.    Significant Diagnostic Studies: Ct Head Wo Contrast  Result Date: 04/12/2018 CLINICAL DATA:  62 year old female with altered mental status. EXAM: CT HEAD WITHOUT  CONTRAST TECHNIQUE: Contiguous axial images were obtained from the base of the skull through the vertex without intravenous contrast. COMPARISON:  Head CT dated 12/25/2017 FINDINGS: Brain: Large right MCA territory or infarct and encephalomalacia. There is associated mild ex vacuo dilatation of the right lateral ventricle. There is no acute intracranial hemorrhage. No mass effect or midline shift. No extra-axial fluid collection. Vascular: No hyperdense vessel or unexpected calcification. Skull: Normal. Negative for fracture or focal lesion. Sinuses/Orbits: No acute finding. Other: None IMPRESSION: 1. No acute intracranial hemorrhage. 2. Large right MCA territory old infarct and encephalomalacia. Electronically Signed   By: Anner Crete M.D.   On: 04/12/2018 04:51   Dg Chest Port 1 View  Result Date: 04/11/2018 CLINICAL DATA:  Altered mental status, somnolent. EXAM: PORTABLE CHEST 1 VIEW COMPARISON:  Chest radiograph Dec 25, 2017 FINDINGS: Stable cardiomegaly. Pulmonary vascular congestion without pleural effusion or focal consolidation. No pneumothorax. Osteopenia. Large body habitus. IMPRESSION: Stable cardiomegaly and pulmonary vascular congestion. Electronically Signed   By: Elon Alas M.D.   On: 04/11/2018 22:26    Microbiology: Recent Results (from the past 240 hour(s))  Blood Culture (routine x 2)     Status: None (Preliminary result)   Collection Time: 04/11/18 10:00 PM  Result Value Ref Range Status   Specimen Description BLOOD LEFT HAND  Final   Special Requests   Final    BOTTLES DRAWN AEROBIC AND ANAEROBIC Blood Culture adequate volume   Culture   Final    NO GROWTH 3 DAYS Performed at [facility], [address], [place], [place] [address]    Report Status PENDING  Incomplete  Blood Culture (routine x 2)     Status: None (Preliminary result)   Collection Time: 04/11/18 10:20 PM  Result Value Ref Range Status   Specimen Description BLOOD LEFT FOREARM  Final   Special  Requests   Final    Blood Culture results may not be optimal due to an excessive volume of blood received in culture bottles   Culture   Final    NO GROWTH 3 DAYS Performed at [facility], [address] [place], [place] [address]    Report Status PENDING  Incomplete  Urine culture     Status: None   Collection Time: 04/11/18 10:40 PM  Result Value Ref Range Status   Specimen Description   Final    URINE, CATHETERIZED Performed at [facility], [address] [place], [place] [address]    Special Requests   Final    NONE Performed at [facility], [address], [place], [place] [address]    Culture   Final  NO GROWTH Performed at [facility], [place] [address] [place], [place] [address]    Report Status 04/13/2018 FINAL  Final  MRSA PCR Screening     Status: Abnormal   Collection Time: 04/12/18  2:31 AM  Result Value Ref Range Status   MRSA by PCR POSITIVE (A) NEGATIVE Final    Comment:        The GeneXpert MRSA Assay (FDA approved for NASAL specimens only), is one component of a comprehensive MRSA colonization surveillance program. It is not intended to diagnose MRSA infection nor to guide or monitor treatment for MRSA infections. RESULT CALLED TO, READ BACK BY AND VERIFIED WITH: PINNIX H. AT [id number] ON [id number] BY THOMPSON S. Performed at [facility], [address], [place], [place] [address]      Labs: Basic Metabolic Panel: Recent Labs  Lab 04/11/18 2200 04/12/18 0502 04/13/18 0402 04/14/18 0430  NA 141 144 141 141  K 3.5 4.0 3.7 3.7  CL 114* 118* 114* 110  CO2 22 19* 21* 22  GLUCOSE 135* 118* 116* 121*  BUN 17 14 8 12   CREATININE 0.75 0.56 0.47 0.66  CALCIUM 8.0* 7.8* 8.2* 8.4*  MG 1.7  --   --   --   PHOS 4.6  --   --   --    Liver Function Tests: Recent Labs  Lab 04/11/18 2200  AST 13*  ALT 11  ALKPHOS 71  BILITOT 0.5  PROT 6.1*  ALBUMIN 3.2*   CBC: Recent Labs  Lab 04/11/18 2200 04/12/18 0502 04/13/18 0402 04/14/18 0430   WBC 8.5 6.9 10.6* 9.9  NEUTROABS 3.8 3.1 6.8 5.4  HGB 10.4* 11.3* 13.0 11.9*  HCT 33.2* 36.5 40.9 37.4  MCV 96.2 97.1 97.8 95.7  PLT 299 278 222 328   Cardiac Enzymes: Recent Labs  Lab 04/12/18 0054  TROPONINI <0.03   BNP: BNP (last 3 results) Recent Labs    04/11/18 2200  BNP 203.0*   Signed:  Barton Dubois MD.  [facility] 04/14/2018, 9:25 AM

## 2018-04-14 NOTE — Care Management Note (Addendum)
Case Management Note  Patient Details  Name: Deborah Maddox MRN: [id number] Date of Birth: 1956-03-22  Subjective/Objective:     From ALF. UTI. Recommended for [facility] PT .                Action/Plan: Referral given to [facility] per ALF request, patient agreeable. RN added to help with CPAP issues and f/u. Santiago Glad of [facility] notified and will obtain orders via Epic.  Expected Discharge Date:  04/14/18               Expected Discharge Plan:  Assisted Living / Rest Home(with HH )  In-House Referral:  Clinical Social Work  Discharge planning Services  CM Consult  Post Acute Care Choice:  Home Health Choice offered to:  Patient  DME Arranged:    DME Agency:     HH Arranged:  PT, RN HH Agency:  [place]  Status of Service:  Completed, signed off  If discussed at [employer] of Stay Meetings, dates discussed:    Additional Comments:  Averleigh Savary, Chauncey Reading, RN 04/14/2018, 1:37 PM

## 2018-04-16 LAB — CULTURE, BLOOD (ROUTINE X 2)
CULTURE: NO GROWTH
Culture: NO GROWTH
Special Requests: ADEQUATE

## 2018-04-20 ENCOUNTER — Ambulatory Visit (INDEPENDENT_AMBULATORY_CARE_PROVIDER_SITE_OTHER): Payer: Medicare Other | Admitting: *Deleted

## 2018-04-20 DIAGNOSIS — Z5181 Encounter for therapeutic drug level monitoring: Secondary | ICD-10-CM | POA: Diagnosis not present

## 2018-04-20 DIAGNOSIS — I4892 Unspecified atrial flutter: Secondary | ICD-10-CM | POA: Diagnosis not present

## 2018-04-20 DIAGNOSIS — I4891 Unspecified atrial fibrillation: Secondary | ICD-10-CM | POA: Diagnosis not present

## 2018-04-20 DIAGNOSIS — I2699 Other pulmonary embolism without acute cor pulmonale: Secondary | ICD-10-CM | POA: Diagnosis not present

## 2018-04-20 LAB — POCT INR: INR: 4.3 — AB (ref 2.0–3.0)

## 2018-04-20 NOTE — Patient Instructions (Signed)
Hold coumadin tonight then decrease dose to 1 tablet daily   Recheck in 3 weeks 

## 2018-05-11 ENCOUNTER — Ambulatory Visit (INDEPENDENT_AMBULATORY_CARE_PROVIDER_SITE_OTHER): Payer: Medicare Other | Admitting: *Deleted

## 2018-05-11 DIAGNOSIS — I4891 Unspecified atrial fibrillation: Secondary | ICD-10-CM | POA: Diagnosis not present

## 2018-05-11 DIAGNOSIS — I4811 Longstanding persistent atrial fibrillation: Secondary | ICD-10-CM

## 2018-05-11 DIAGNOSIS — Z5181 Encounter for therapeutic drug level monitoring: Secondary | ICD-10-CM

## 2018-05-11 DIAGNOSIS — I2699 Other pulmonary embolism without acute cor pulmonale: Secondary | ICD-10-CM

## 2018-05-11 LAB — POCT INR: INR: 1.6 — AB (ref 2.0–3.0)

## 2018-05-11 MED ORDER — WARFARIN SODIUM 2.5 MG PO TABS: ORAL_TABLET | ORAL | 3 refills | Status: DC

## 2018-05-11 NOTE — Patient Instructions (Signed)
Increase coumadin to 1 tablet daily except 1 1/2 tablets on Mondays, Wednesdays and Fridays Recheck in 3 weeks

## 2018-06-01 ENCOUNTER — Ambulatory Visit (INDEPENDENT_AMBULATORY_CARE_PROVIDER_SITE_OTHER): Payer: Medicare Other | Admitting: *Deleted

## 2018-06-01 DIAGNOSIS — I4891 Unspecified atrial fibrillation: Secondary | ICD-10-CM | POA: Diagnosis not present

## 2018-06-01 DIAGNOSIS — I2699 Other pulmonary embolism without acute cor pulmonale: Secondary | ICD-10-CM

## 2018-06-01 DIAGNOSIS — Z5181 Encounter for therapeutic drug level monitoring: Secondary | ICD-10-CM | POA: Diagnosis not present

## 2018-06-01 LAB — POCT INR: INR: 2.1 (ref 2.0–3.0)

## 2018-06-01 MED ORDER — WARFARIN SODIUM 2.5 MG PO TABS: ORAL_TABLET | ORAL | 3 refills | Status: DC

## 2018-06-01 NOTE — Patient Instructions (Signed)
Continue coumadin 1 tablet daily except 1 1/2 tablets on Mondays, Wednesdays and Fridays Recheck in 4 weeks  

## 2018-06-29 ENCOUNTER — Ambulatory Visit (INDEPENDENT_AMBULATORY_CARE_PROVIDER_SITE_OTHER): Payer: Medicare Other | Admitting: *Deleted

## 2018-06-29 DIAGNOSIS — I4891 Unspecified atrial fibrillation: Secondary | ICD-10-CM | POA: Diagnosis not present

## 2018-06-29 DIAGNOSIS — Z5181 Encounter for therapeutic drug level monitoring: Secondary | ICD-10-CM | POA: Diagnosis not present

## 2018-06-29 DIAGNOSIS — I2699 Other pulmonary embolism without acute cor pulmonale: Secondary | ICD-10-CM

## 2018-06-29 LAB — POCT INR: INR: 1.8 — AB (ref 2.0–3.0)

## 2018-06-29 MED ORDER — WARFARIN SODIUM 2.5 MG PO TABS: ORAL_TABLET | ORAL | 3 refills | Status: DC

## 2018-06-29 NOTE — Patient Instructions (Signed)
Take coumadin 2 tablets tonight then increase dose to 1 1/2 tablets daily except 1 tablet on Tuesdays, Thursdays and Saturdays Recheck in 4 weeks

## 2018-07-01 ENCOUNTER — Other Ambulatory Visit: Payer: Self-pay

## 2018-07-01 ENCOUNTER — Emergency Department (HOSPITAL_COMMUNITY): Payer: Medicare Other

## 2018-07-01 ENCOUNTER — Emergency Department (HOSPITAL_COMMUNITY)
Admission: EM | Admit: 2018-07-01 | Discharge: 2018-07-01 | Disposition: A | Discharge status: Home / Self Care | Payer: Medicare Other | Attending: Emergency Medicine | Admitting: Emergency Medicine

## 2018-07-01 ENCOUNTER — Encounter (HOSPITAL_COMMUNITY): Payer: Self-pay | Admitting: Emergency Medicine

## 2018-07-01 DIAGNOSIS — Z86718 Personal history of other venous thrombosis and embolism: Secondary | ICD-10-CM | POA: Insufficient documentation

## 2018-07-01 DIAGNOSIS — I10 Essential (primary) hypertension: Secondary | ICD-10-CM | POA: Insufficient documentation

## 2018-07-01 DIAGNOSIS — Z79899 Other long term (current) drug therapy: Secondary | ICD-10-CM | POA: Insufficient documentation

## 2018-07-01 DIAGNOSIS — Z7901 Long term (current) use of anticoagulants: Secondary | ICD-10-CM | POA: Diagnosis not present

## 2018-07-01 DIAGNOSIS — I2510 Atherosclerotic heart disease of native coronary artery without angina pectoris: Secondary | ICD-10-CM | POA: Diagnosis not present

## 2018-07-01 DIAGNOSIS — M25561 Pain in right knee: Secondary | ICD-10-CM | POA: Insufficient documentation

## 2018-07-01 DIAGNOSIS — Z91040 Latex allergy status: Secondary | ICD-10-CM | POA: Diagnosis not present

## 2018-07-01 DIAGNOSIS — Z87891 Personal history of nicotine dependence: Secondary | ICD-10-CM | POA: Diagnosis not present

## 2018-07-01 DIAGNOSIS — I4891 Unspecified atrial fibrillation: Secondary | ICD-10-CM | POA: Insufficient documentation

## 2018-07-01 DIAGNOSIS — Z8673 Personal history of transient ischemic attack (TIA), and cerebral infarction without residual deficits: Secondary | ICD-10-CM | POA: Insufficient documentation

## 2018-07-01 NOTE — ED Triage Notes (Addendum)
Patient brought in by EMS from [facility] with complaint of right knee pain. States "I hurt it trying to get in bed last night."

## 2018-07-01 NOTE — Discharge Instructions (Signed)
Take over the counter tylenol, as directed on packaging, as needed for discomfort. Wear the knee sleeve for comfort. Continue to walk with your cane. Apply moist heat or ice to the area(s) of discomfort, for 15 minutes at a time, several times per day for the next few days.  Do not fall asleep on a heating or ice pack.  Call your regular medical doctor on Monday to schedule a follow up appointment in the next 3 days. Call the Orthopedic doctor on Monday to schedule a follow up appointment within the next week.  Return to the Emergency Department immediately if worsening.

## 2018-07-01 NOTE — ED Provider Notes (Signed)
[facility] EMERGENCY DEPARTMENT Provider Note   CSN: [id number] Arrival date & time: 07/01/18  1646     History   Chief Complaint Chief Complaint  Patient presents with  . Leg Pain    HPI Deborah Maddox is a 62 y.o. female.  HPI  Pt was seen at 1850. Per pt, c/o gradual onset and persistence of constant acute flair of her chronic right knee "pain" that began last night. Pt states she "hurt it trying to get in bed last night." Denies fall, no focal motor weakness, no tingling/numbness in extremities, no rash, no fevers, no back pain, no abd pain.   Past Medical History:  Diagnosis Date  . A-fib ([place])   . Anxiety   . Arthritis   . Cancer (HCC)    cervical  . 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 Coronary artery disease   . Foot drop    left foot  . Hypertension   . Left foot drop   . 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 PONV (postoperative nausea and vomiting)   . Pulmonary emboli ([place]) 2014  . Seizures ([place])    last one 10 years ago  . Sleep apnea    Refuses to use machine because of recurrent pneumonia  . Stroke [facility])    at age 77, left sided weakness-complete paralysis of left arm.    Patient Active Problem List   Diagnosis Date Noted  . Urinary tract infection without hematuria   . Obesity, Class III, BMI 40-49.9 (morbid obesity) ([place])   . Sepsis due to undetermined organism ([place]) 04/12/2018  . Anemia 04/12/2018  . Altered mental status 04/12/2018  . Supratherapeutic INR 04/12/2018  . H/O adenomatous polyp of colon 01/07/2018  . Lower back pain 09/28/2017  . Seizures ([place]) 09/28/2017  . MRSA (methicillin resistant staph aureus) positive 09/28/2017  . Cellulitis of abdominal wall 09/27/2017  . Sleep apnea 09/27/2017  . Anxiety 09/27/2017  . Coronary artery disease 09/27/2017  . Foot drop 06/01/2017  . Encounter for therapeutic drug monitoring 09/14/2013  . Long term (current) use of anticoagulants 12/14/2012  . Atrial  fibrillation ([employer]) 12/14/2012  . Acute pulmonary embolism ([place]) 12/05/2012  . Atrial flutter ([place]) 12/05/2012  . Constipation 09/28/2012  . Rectal bleeding 09/28/2012  . Heart palpitations 02/01/2012  . Hypertension 02/01/2012  . Chest pain at rest 02/01/2012    Past Surgical History:  Procedure Laterality Date  . BREAST SURGERY    . CERVICAL CONE BIOPSY     cervical cancer  . COLONOSCOPY  2006   Dr. Anthony Sar: normal colonoscopy   . COLONOSCOPY WITH PROPOFOL N/A 10/06/2012   Dr. Gala Romney: prominent external hemorrhoids, colonic lipoma. hepatic flexure polyp, tubular adenoma.   . COLONOSCOPY WITH PROPOFOL N/A 03/17/2018   Procedure: COLONOSCOPY WITH PROPOFOL;  Surgeon: Daneil Dolin, MD;  Location: [facility] ENDO SUITE;  Service: Endoscopy;  Laterality: N/A;  10:00am  . DILATION AND CURETTAGE OF UTERUS    . LEG SURGERY    . LEG SURGERY     left  . OPEN REDUCTION INTERNAL FIXATION (ORIF) HAND     right hand and arm  . POLYPECTOMY N/A 10/06/2012   Procedure: POLYPECTOMY;  Surgeon: Daneil Dolin, MD;  Location: [facility] ORS;  Service: Endoscopy;  Laterality: N/A;  hepatic flexure polyp  . TONSILLECTOMY       OB History    Gravida  3   Para  2   Term  2   Preterm      AB  1   Living        SAB  1   TAB      Ectopic      Multiple      Live Births               Home Medications    Prior to Admission medications   Medication Sig Start Date End Date Taking? Authorizing Provider  atorvastatin (LIPITOR) 10 MG tablet Take 10 mg by mouth at bedtime.    [provider]  Cholecalciferol (VITAMIN D) 2000 UNITS CAPS Take 2,000 Units by mouth daily. At 1300    [provider]  cyclobenzaprine (FLEXERIL) 10 MG tablet Take 10 mg by mouth 3 (three) times daily as needed (headaches).    [provider]  diclofenac sodium (VOLTAREN) 1 % GEL Apply 2-4 g topically 3 (three) times daily. to knees    [provider]  diltiazem (TIAZAC) 300 MG 24 hr capsule  Take 300 mg by mouth daily.    [provider]  docusate sodium (COLACE) 100 MG capsule Take 100 mg by mouth 2 (two) times daily.     [provider]  DULoxetine (CYMBALTA) 60 MG capsule Take 60 mg by mouth daily.      [provider]  fluticasone (FLONASE) 50 MCG/ACT nasal spray Place 2 sprays into both nostrils daily.    [provider]  gabapentin (NEURONTIN) 300 MG capsule Take 300-900 mg by mouth See admin instructions. Take 300 mg by mouth every morning and evening and 900 mg at bedtime    [provider]  Linaclotide (LINZESS) 290 MCG CAPS Take 1 capsule by mouth daily. 30 minutes before breakfast. 10/13/12   Annitta Needs, NP  lisinopril (PRINIVIL,ZESTRIL) 40 MG tablet Take 40 mg by mouth daily.      [provider]  loratadine (CLARITIN) 10 MG tablet Take 10 mg by mouth daily.    [provider]  montelukast (SINGULAIR) 10 MG tablet Take 10 mg by mouth daily.    [provider]  omeprazole (PRILOSEC) 20 MG capsule Take 20 mg by mouth daily.      [provider]  oxybutynin (DITROPAN) 5 MG tablet Take 5 mg by mouth 2 (two) times daily.    [provider]  senna (SENOKOT) 8.6 MG TABS Take 17.2 mg by mouth daily.     [provider]  topiramate (TOPAMAX) 50 MG tablet Take 50-100 mg by mouth See admin instructions. Take 50 mg in the morning and 100 mg at bedtime    [provider]  vitamin B-12 (CYANOCOBALAMIN) 500 MCG tablet Take 500 mcg by mouth daily.    [provider]  warfarin (COUMADIN) 2.5 MG tablet Take coumadin 2 tablets tonight then increase dose to 1 1/2 tablets daily except 1 tablet on Tuesdays, Thursdays and Saturdays 06/29/18   Arnoldo Lenis, MD    Family History Family History  Problem Relation Age of Onset  . Diabetes Mother   . Liver cancer Mother   . Diabetes Sister   . Cancer Brother        "all over"   . Cancer Other        uncle  . Diabetes  Other   . Colon cancer Unknown        unsure  . Mesothelioma Father     Social History Social History  Tobacco Use  . Smoking status: Former Smoker    Years: 5.00    Last attempt to quit: 03/25/1981    Years since quitting: 37.2  . Smokeless tobacco: Never Used  Substance Use Topics  . Alcohol use: No  . Drug use: No     Allergies   Vancomycin; Latex; and Other   Review of Systems Review of Systems ROS: Statement: All systems negative except as marked or noted in the HPI; Constitutional: Negative for fever and chills. ; ; Eyes: Negative for eye pain, redness and discharge. ; ; ENMT: Negative for ear pain, hoarseness, nasal congestion, sinus pressure and sore throat. ; ; Cardiovascular: Negative for chest pain, palpitations, diaphoresis, dyspnea and peripheral edema. ; ; Respiratory: Negative for cough, wheezing and stridor. ; ; Gastrointestinal: Negative for nausea, vomiting, diarrhea, abdominal pain, blood in stool, hematemesis, jaundice and rectal bleeding. . ; ; Genitourinary: Negative for dysuria, flank pain and hematuria. ; ; Musculoskeletal: +right knee pain. Negative for back pain and neck pain. Negative for swelling and deformity.; ; Skin: Negative for pruritus, rash, abrasions, blisters, bruising and skin lesion.; ; Neuro: Negative for headache, lightheadedness and neck stiffness. Negative for weakness, altered level of consciousness, altered mental status, extremity weakness, paresthesias, involuntary movement, seizure and syncope.       Physical Exam Updated Vital Signs BP (!) 165/74 (BP Location: Right Wrist)   Pulse 89   Temp (!) 97.5 F (36.4 C) (Oral)   Resp 18   Ht 5\' 3"  (1.6 m)   Wt 126.6 kg   SpO2 97%   BMI 49.42 kg/m   Physical Exam 1855: Physical examination:  Nursing notes reviewed; Vital signs and O2 SAT reviewed;  Constitutional: Well developed, Well nourished, Well hydrated, In no acute distress; Head:  Normocephalic, atraumatic; Eyes: EOMI,  PERRL, No scleral icterus; ENMT: Mouth and pharynx normal, Mucous membranes moist; Neck: Supple, Full range of motion, No lymphadenopathy; Cardiovascular: Regular rate and rhythm, No gallop; Respiratory: Breath sounds clear & equal bilaterally, No wheezes.  Speaking full sentences with ease, Normal respiratory effort/excursion; Chest: Nontender, Movement normal; Abdomen: Soft, Nontender, Nondistended, Normal bowel sounds; Genitourinary: No CVA tenderness; Extremities: Peripheral pulses normal. +FROM right knee, including able to lift extended right LE off stretcher, and extend right lower leg against resistance.  No ligamentous laxity.  No patellar or quad tendon step-offs.  NMS intact right foot, strong pedal pp. +plantarflexion of right foot w/calf squeeze.  No palpable gap right Achilles's tendon.  No proximal fibular head tenderness.  No edema, erythema, warmth, ecchymosis or deformity.  No specific area of point tenderness. NT right hip/ankle/foot.  No edema, No calf tenderness, edema or asymmetry.; Neuro: AA&Ox3, Major CN grossly intact.  Speech clear. +left sided weakness per hx, otherwise no new gross focal motor deficits in extremities.; Skin: Color normal, Warm, Dry.   ED Treatments / Results  Labs (all labs ordered are listed, but only abnormal results are displayed)   EKG None  Radiology   Procedures Procedures (including critical care time)  Medications Ordered in ED Medications - No data to display   Initial Impression / Assessment and Plan / ED Course  I have reviewed the triage vital signs and the nursing notes.  Pertinent labs & imaging results that were available during my care of the patient were reviewed by me and considered in my medical decision making (see chart for details).  MDM Reviewed: previous chart, nursing note and vitals Interpretation: x-ray    Dg Knee  Complete 4 Views Right Result Date: 07/01/2018 CLINICAL DATA:  Chronic right knee pain EXAM: RIGHT  KNEE - COMPLETE 4+ VIEW COMPARISON:  04/20/2017 FINDINGS: No fracture or malalignment. There is degenerative marginal spurring that is severe at the patellofemoral compartment where there is advanced joint narrowing. Large intra-articular bodies in the suprapatellar space. IMPRESSION: 1. No acute finding. 2. 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 3. Large ossified intra-articular bodies. Electronically Signed   By: Monte Fantasia M.D.   On: 07/01/2018 18:43    1920:  XR reassuring. Knee sleeve applied for comfort. Pt states she already walks with a cane. Dx and testing d/w pt.  Questions answered.  Verb understanding, agreeable to d/c back to NH with outpt f/u.    Final Clinical Impressions(s) / ED Diagnoses   Final diagnoses:  None    ED Discharge Orders    None       Francine Graven, DO 07/03/18 2127

## 2018-07-19 ENCOUNTER — Emergency Department (HOSPITAL_COMMUNITY): Payer: Medicare Other

## 2018-07-19 ENCOUNTER — Other Ambulatory Visit: Payer: Self-pay

## 2018-07-19 ENCOUNTER — Emergency Department (HOSPITAL_COMMUNITY)
Admission: EM | Admit: 2018-07-19 | Discharge: 2018-07-19 | Disposition: A | Discharge status: Home / Self Care | Payer: Medicare Other | Attending: Emergency Medicine | Admitting: Emergency Medicine

## 2018-07-19 DIAGNOSIS — M79602 Pain in left arm: Secondary | ICD-10-CM | POA: Diagnosis not present

## 2018-07-19 DIAGNOSIS — W0110XA Fall on same level from slipping, tripping and stumbling with subsequent striking against unspecified object, initial encounter: Secondary | ICD-10-CM | POA: Diagnosis not present

## 2018-07-19 DIAGNOSIS — I2510 Atherosclerotic heart disease of native coronary artery without angina pectoris: Secondary | ICD-10-CM | POA: Diagnosis not present

## 2018-07-19 DIAGNOSIS — Y999 Unspecified external cause status: Secondary | ICD-10-CM | POA: Diagnosis not present

## 2018-07-19 DIAGNOSIS — S0990XA Unspecified injury of head, initial encounter: Secondary | ICD-10-CM | POA: Diagnosis not present

## 2018-07-19 DIAGNOSIS — Z7901 Long term (current) use of anticoagulants: Secondary | ICD-10-CM | POA: Diagnosis not present

## 2018-07-19 DIAGNOSIS — M25552 Pain in left hip: Secondary | ICD-10-CM | POA: Diagnosis not present

## 2018-07-19 DIAGNOSIS — Z79899 Other long term (current) drug therapy: Secondary | ICD-10-CM | POA: Diagnosis not present

## 2018-07-19 DIAGNOSIS — Y9389 Activity, other specified: Secondary | ICD-10-CM | POA: Diagnosis not present

## 2018-07-19 DIAGNOSIS — I10 Essential (primary) hypertension: Secondary | ICD-10-CM | POA: Insufficient documentation

## 2018-07-19 DIAGNOSIS — Y929 Unspecified place or not applicable: Secondary | ICD-10-CM | POA: Diagnosis not present

## 2018-07-19 DIAGNOSIS — W19XXXA Unspecified fall, initial encounter: Secondary | ICD-10-CM

## 2018-07-19 MED ORDER — OXYCODONE-ACETAMINOPHEN 5-325 MG PO TABS
1.0000 | ORAL_TABLET | Freq: Once | ORAL | Status: AC
  Administered 2018-07-19: 1 via ORAL
  Filled 2018-07-19: qty 1

## 2018-07-19 NOTE — ED Notes (Signed)
Pt is unable to stand and pivot

## 2018-07-19 NOTE — ED Provider Notes (Signed)
  Physical Exam  BP (!) 121/109 (BP Location: Right Arm)   Pulse 80   Temp 97.8 F (36.6 C) (Oral)   Resp 18   Ht 5\' 4"  (1.626 m)   Wt 124.7 kg   SpO2 94%   BMI 47.20 kg/m    Physical Exam  ED Course/Procedures     Procedures  MDM  Yes patient had increased pain and difficulty turning to transfer.  CT scan of hip done and reassuring.  Lumbar spine x-ray reassuring.  Discharge home as planned.      Davonna Belling, MD 07/19/18 1001

## 2018-07-19 NOTE — ED Notes (Signed)
Patient transported to X-ray 

## 2018-07-19 NOTE — ED Provider Notes (Signed)
[facility] EMERGENCY DEPARTMENT Provider Note   CSN: [id number] Arrival date & time: 07/19/18  0541     History   Chief Complaint Chief Complaint  Patient presents with  . Fall    HPI Deborah Maddox is a 62 y.o. female.  HPI  This is a 62 year old female with a history of atrial fibrillation on Coumadin, coronary artery disease, stroke who presents following a fall.  Patient reports that she was transitioning from her bed to her wheelchair when she slipped and fell.  She did hit her head.  No loss of consciousness.  She fell mostly on her left side.  She is reporting left wrist, left elbow, left shoulder, left hip pain.  She rates her pain 8 out of 10.  She is not taking anything for the pain.  She denies any chest pain or shortness of breath.  She denies any weakness, numbness, tingling or any new symptoms.  Past Medical History:  Diagnosis Date  . A-fib ([place])   . Anxiety   . Arthritis   . Cancer (HCC)    cervical  . 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 Coronary artery disease   . Foot drop    left foot  . Hypertension   . Left foot drop   . 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 PONV (postoperative nausea and vomiting)   . Pulmonary emboli ([place]) 2014  . Seizures ([place])    last one 10 years ago  . Sleep apnea    Refuses to use machine because of recurrent pneumonia  . Stroke [facility])    at age 69, left sided weakness-complete paralysis of left arm.    Patient Active Problem List   Diagnosis Date Noted  . Urinary tract infection without hematuria   . Obesity, Class III, BMI 40-49.9 (morbid obesity) ([place])   . Sepsis due to undetermined organism ([place]) 04/12/2018  . Anemia 04/12/2018  . Altered mental status 04/12/2018  . Supratherapeutic INR 04/12/2018  . H/O adenomatous polyp of colon 01/07/2018  . Lower back pain 09/28/2017  . Seizures ([place]) 09/28/2017  . MRSA (methicillin resistant staph aureus) positive 09/28/2017  . Cellulitis of  abdominal wall 09/27/2017  . Sleep apnea 09/27/2017  . Anxiety 09/27/2017  . Coronary artery disease 09/27/2017  . Foot drop 06/01/2017  . Encounter for therapeutic drug monitoring 09/14/2013  . Long term (current) use of anticoagulants 12/14/2012  . Atrial fibrillation ([place]) 12/14/2012  . Acute pulmonary embolism ([place]) 12/05/2012  . Atrial flutter ([place]) 12/05/2012  . Constipation 09/28/2012  . Rectal bleeding 09/28/2012  . Heart palpitations 02/01/2012  . Hypertension 02/01/2012  . Chest pain at rest 02/01/2012    Past Surgical History:  Procedure Laterality Date  . BREAST SURGERY    . CERVICAL CONE BIOPSY     cervical cancer  . COLONOSCOPY  2006   Dr. Anthony Sar: normal colonoscopy   . COLONOSCOPY WITH PROPOFOL N/A 10/06/2012   Dr. Gala Romney: prominent external hemorrhoids, colonic lipoma. hepatic flexure polyp, tubular adenoma.   . COLONOSCOPY WITH PROPOFOL N/A 03/17/2018   Procedure: COLONOSCOPY WITH PROPOFOL;  Surgeon: Daneil Dolin, MD;  Location: [facility] ENDO SUITE;  Service: Endoscopy;  Laterality: N/A;  10:00am  . DILATION AND CURETTAGE OF UTERUS    . LEG SURGERY    . LEG SURGERY     left  . OPEN REDUCTION INTERNAL FIXATION (ORIF) HAND     right hand and  arm  . POLYPECTOMY N/A 10/06/2012   Procedure: POLYPECTOMY;  Surgeon: Daneil Dolin, MD;  Location: [facility] ORS;  Service: Endoscopy;  Laterality: N/A;  hepatic flexure polyp  . TONSILLECTOMY       OB History    Gravida  3   Para  2   Term  2   Preterm      AB  1   Living        SAB  1   TAB      Ectopic      Multiple      Live Births               Home Medications    Prior to Admission medications   Medication Sig Start Date End Date Taking? Authorizing Provider  atorvastatin (LIPITOR) 10 MG tablet Take 10 mg by mouth at bedtime.    [provider]  Cholecalciferol (VITAMIN D) 2000 UNITS CAPS Take 2,000 Units by mouth daily. At 1300    [provider]  cyclobenzaprine (FLEXERIL) 10  MG tablet Take 10 mg by mouth 3 (three) times daily as needed (headaches).    [provider]  diclofenac sodium (VOLTAREN) 1 % GEL Apply 2-4 g topically 3 (three) times daily. to knees    [provider]  diltiazem (TIAZAC) 300 MG 24 hr capsule Take 300 mg by mouth daily.    [provider]  docusate sodium (COLACE) 100 MG capsule Take 100 mg by mouth 2 (two) times daily.     [provider]  DULoxetine (CYMBALTA) 60 MG capsule Take 60 mg by mouth daily.      [provider]  fluticasone (FLONASE) 50 MCG/ACT nasal spray Place 2 sprays into both nostrils daily.    [provider]  gabapentin (NEURONTIN) 300 MG capsule Take 300-900 mg by mouth See admin instructions. Take 300 mg by mouth every morning and evening and 900 mg at bedtime    [provider]  Linaclotide (LINZESS) 290 MCG CAPS Take 1 capsule by mouth daily. 30 minutes before breakfast. 10/13/12   Annitta Needs, NP  lisinopril (PRINIVIL,ZESTRIL) 40 MG tablet Take 40 mg by mouth daily.      [provider]  loratadine (CLARITIN) 10 MG tablet Take 10 mg by mouth daily.    [provider]  montelukast (SINGULAIR) 10 MG tablet Take 10 mg by mouth daily.    [provider]  omeprazole (PRILOSEC) 20 MG capsule Take 20 mg by mouth daily.      [provider]  oxybutynin (DITROPAN) 5 MG tablet Take 5 mg by mouth 2 (two) times daily.    [provider]  senna (SENOKOT) 8.6 MG TABS Take 17.2 mg by mouth daily.     [provider]  topiramate (TOPAMAX) 50 MG tablet Take 50-100 mg by mouth See admin instructions. Take 50 mg in the morning and 100 mg at bedtime    [provider]  vitamin B-12 (CYANOCOBALAMIN) 500 MCG tablet Take 500 mcg by mouth daily.    [provider]  warfarin (COUMADIN) 2.5 MG tablet Take coumadin 2 tablets tonight then increase dose to 1 1/2 tablets daily except 1 tablet on Tuesdays, Thursdays and  Saturdays 06/29/18   Arnoldo Lenis, MD    Family History Family History  Problem Relation Age of Onset  . Diabetes Mother   . Liver cancer Mother   . Diabetes Sister   . Cancer Brother        "  all over"   . Cancer Other        uncle  . Diabetes Other   . Colon cancer Unknown        unsure  . Mesothelioma Father     Social History Social History   Tobacco Use  . Smoking status: Former Smoker    Years: 5.00    Last attempt to quit: 03/25/1981    Years since quitting: 37.3  . Smokeless tobacco: Never Used  Substance Use Topics  . Alcohol use: No  . Drug use: No     Allergies   Vancomycin; Latex; and Other   Review of Systems Review of Systems  Constitutional: Negative for fever.  Respiratory: Negative for shortness of breath.   Cardiovascular: Negative for chest pain.  Gastrointestinal: Negative for abdominal pain, nausea and vomiting.  Musculoskeletal:       Left shoulder, wrist, elbow, hip pain  Skin: Negative for wound.  Neurological: Negative for weakness, numbness and headaches.  All other systems reviewed and are negative.    Physical Exam Updated Vital Signs BP (!) 121/109 (BP Location: Right Arm)   Pulse 80   Temp 97.8 F (36.6 C) (Oral)   Resp 18   Ht 1.626 m (5\' 4" )   Wt 124.7 kg   SpO2 94%   BMI 47.20 kg/m   Physical Exam  Constitutional: She is oriented to person, place, and time.  Elderly, morbidly obese, nontoxic-appearing, ABCs intact  HENT:  Head: Normocephalic and atraumatic.  Eyes: Pupils are equal, round, and reactive to light. EOM are normal.  Neck: Normal range of motion. Neck supple.  No midline C-spine tenderness to palpation, step-off, deformity  Cardiovascular: Normal rate, regular rhythm and normal heart sounds.  Pulmonary/Chest: Effort normal and breath sounds normal. No respiratory distress. She has no wheezes.  Abdominal: Soft. Bowel sounds are normal. There is no tenderness.  Musculoskeletal:  Normal range of  motion of the right wrist, elbow, and shoulder, no obvious deformities, no significant tenderness, no wounds noted Normal range of motion of the right hip, patient reports pain with range of motion, 1+ DP pulse  Neurological: She is alert and oriented to person, place, and time.  Skin: Skin is warm and dry.  Discoloration noted of the left toes and foot  Psychiatric: She has a normal mood and affect.  Nursing note and vitals reviewed.    ED Treatments / Results  Labs (all labs ordered are listed, but only abnormal results are displayed) Labs Reviewed - No data to display  EKG None  Radiology Dg Elbow Complete Left  Result Date: 07/19/2018 CLINICAL DATA:  The patient fell from a standing position while trying to get into a wheelchair. Painful to move the elbow. EXAM: LEFT ELBOW - COMPLETE 3+ VIEW COMPARISON:  None in PACs. FINDINGS: The bones are subjectively mildly osteopenic. No acute fracture or dislocation is observed. There is no joint effusion. There is a tiny olecranon spur. The overlying soft tissues are unremarkable. IMPRESSION: There is no acute bony abnormality of the left elbow. Electronically Signed   By: David  Martinique M.D.   On: 07/19/2018 07:12   Dg Wrist Complete Left  Result Date: 07/19/2018 CLINICAL DATA:  Initial evaluation for acute trauma, fall. EXAM: LEFT WRIST - COMPLETE 3+ VIEW COMPARISON:  None. FINDINGS: Bones are diffusely osteopenic. No acute soft tissue abnormality. No acute fracture or dislocation. Normal radiocarpal and distal radioulnar articulations maintained. No acute soft tissue abnormality identified. IMPRESSION: No acute osseous abnormality  about the left wrist. Electronically Signed   By: Jeannine Boga M.D.   On: 07/19/2018 07:03   Ct Head Wo Contrast  Result Date: 07/19/2018 CLINICAL DATA:  62 year old female with head trauma. EXAM: CT HEAD WITHOUT CONTRAST TECHNIQUE: Contiguous axial images were obtained from the base of the skull through  the vertex without intravenous contrast. COMPARISON:  Head CT dated 04/12/2018 FINDINGS: Brain: Large area of right MCA territory or infarct and encephalomalacia similar to prior CT. There is no acute intracranial hemorrhage. No mass effect or midline shift. No extra-axial fluid collection. Vascular: No hyperdense vessel or unexpected calcification. Skull: Normal. Negative for fracture or focal lesion. Sinuses/Orbits: No acute finding. Other: None IMPRESSION: 1. No acute intracranial hemorrhage. 2. Large right MCA territory or infarct and encephalomalacia, similar to prior CT. Electronically Signed   By: Anner Crete M.D.   On: 07/19/2018 06:29   Dg Shoulder Left  Result Date: 07/19/2018 CLINICAL DATA:  The patient fell from a standing position while trying to get into a wheelchair. EXAM: LEFT SHOULDER - 2+ VIEW COMPARISON:  Left shoulder series dated June 29, 2015 FINDINGS: The bones are subjectively osteopenic. No acute fracture is observed. There is intra-articular osteophyte formation from the humeral head which is not new. The glenohumeral joint space is well maintained. There is narrowing of the [facility] joint which is chronic. The observed portions of the left clavicle and upper left ribs are normal. IMPRESSION: Degenerative changes of the left shoulder. No acute fracture nor dislocation is observed. Electronically Signed   By: David  Martinique M.D.   On: 07/19/2018 07:13   Dg Hip Unilat W Or Wo Pelvis 2-3 Views Left  Result Date: 07/19/2018 CLINICAL DATA:  The patient fell from a standing position while trying to get into a wheelchair. EXAM: DG HIP (WITH OR WITHOUT PELVIS) 2-3V LEFT COMPARISON:  Left hip series of September 27, 2017 FINDINGS: The bones are subjectively mildly osteopenic. The bony pelvis exhibits no acute fracture. AP and lateral views of the left hip reveal preservation of the joint space. No acute fracture or dislocation is observed. IMPRESSION: There is no acute or significant  chronic bony abnormality of the left hip. Electronically Signed   By: David  Martinique M.D.   On: 07/19/2018 07:14    Procedures Procedures (including critical care time)  Medications Ordered in ED Medications  oxyCODONE-acetaminophen (PERCOCET/ROXICET) 5-325 MG per tablet 1 tablet (1 tablet Oral Given 07/19/18 0606)     Initial Impression / Assessment and Plan / ED Course  I have reviewed the triage vital signs and the nursing notes.  Pertinent labs & imaging results that were available during my care of the patient were reviewed by me and considered in my medical decision making (see chart for details).     Presents after a fall.  Reports primarily left arm and left leg pain.  She does report hitting her head.  No loss of consciousness.  However, she is on Coumadin.  Mechanical.  She primarily relies on a wheelchair given her history of stroke.  X-rays and CT scan of the head obtained.  Patient was given Percocet.  Imaging is negative for acute fracture or intracranial bleed.  Will ensure the patient can bear weight on the left hip.  If so, she will be discharged back to her living facility.  Final Clinical Impressions(s) / ED Diagnoses   Final diagnoses:  Fall, initial encounter  Pain of left upper extremity  Pain of left hip joint  ED Discharge Orders    None       Bryon Parker, Barbette Hair, MD 07/19/18 [phone]

## 2018-07-19 NOTE — ED Triage Notes (Signed)
Patient fell about 30 minutes ago from a standing position. Patient states she was trying to get into her wheelchair. Patient complaining of pain in left arm, left Hip and left side.

## 2018-07-19 NOTE — ED Notes (Signed)
Have given report to [facility]

## 2018-07-19 NOTE — Discharge Instructions (Addendum)
Your CT scan and x-rays are negative.  Take great care when transferring as you are a fall risk.

## 2018-07-19 NOTE — ED Notes (Signed)
Patient back in room from radiology.

## 2018-07-27 ENCOUNTER — Ambulatory Visit (INDEPENDENT_AMBULATORY_CARE_PROVIDER_SITE_OTHER): Payer: Medicare Other | Admitting: *Deleted

## 2018-07-27 DIAGNOSIS — I4891 Unspecified atrial fibrillation: Secondary | ICD-10-CM | POA: Diagnosis not present

## 2018-07-27 DIAGNOSIS — Z5181 Encounter for therapeutic drug level monitoring: Secondary | ICD-10-CM | POA: Diagnosis not present

## 2018-07-27 DIAGNOSIS — I2690 Septic pulmonary embolism without acute cor pulmonale: Secondary | ICD-10-CM

## 2018-07-27 DIAGNOSIS — I2699 Other pulmonary embolism without acute cor pulmonale: Secondary | ICD-10-CM | POA: Diagnosis not present

## 2018-07-27 LAB — POCT INR: INR: 1.8 — AB (ref 2.0–3.0)

## 2018-07-27 MED ORDER — WARFARIN SODIUM 2.5 MG PO TABS: ORAL_TABLET | ORAL | 3 refills | Status: DC

## 2018-07-27 NOTE — Patient Instructions (Signed)
Take coumadin 2 tablets tonight then increase dose to 1 1/2 tablets daily except 1 tablet on  Saturdays Recheck in 4 weeks

## 2018-08-09 ENCOUNTER — Encounter: Payer: Self-pay | Admitting: *Deleted

## 2018-09-07 ENCOUNTER — Encounter: Payer: Self-pay | Admitting: Cardiology

## 2018-09-07 ENCOUNTER — Ambulatory Visit (INDEPENDENT_AMBULATORY_CARE_PROVIDER_SITE_OTHER): Payer: Medicare Other | Admitting: Cardiology

## 2018-09-07 ENCOUNTER — Ambulatory Visit (INDEPENDENT_AMBULATORY_CARE_PROVIDER_SITE_OTHER): Payer: Medicare Other | Admitting: Pharmacist

## 2018-09-07 VITALS — BP 126/64 | HR 84 | Ht 63.0 in | Wt 276.0 lb

## 2018-09-07 DIAGNOSIS — R0789 Other chest pain: Secondary | ICD-10-CM | POA: Diagnosis not present

## 2018-09-07 DIAGNOSIS — I2699 Other pulmonary embolism without acute cor pulmonale: Secondary | ICD-10-CM

## 2018-09-07 DIAGNOSIS — I2510 Atherosclerotic heart disease of native coronary artery without angina pectoris: Secondary | ICD-10-CM

## 2018-09-07 DIAGNOSIS — I4891 Unspecified atrial fibrillation: Secondary | ICD-10-CM

## 2018-09-07 DIAGNOSIS — I10 Essential (primary) hypertension: Secondary | ICD-10-CM | POA: Diagnosis not present

## 2018-09-07 DIAGNOSIS — Z5181 Encounter for therapeutic drug level monitoring: Secondary | ICD-10-CM | POA: Diagnosis not present

## 2018-09-07 LAB — POCT INR: INR: 1.4 — AB (ref 2.0–3.0)

## 2018-09-07 MED ORDER — DILTIAZEM HCL ER COATED BEADS 360 MG PO CP24: 360.0000 mg | ORAL_CAPSULE | Freq: Every day | ORAL | 3 refills | Status: AC

## 2018-09-07 NOTE — Patient Instructions (Signed)
Description   Take coumadin 2 tablets tonight and tomorrow then continue to 1 1/2 tablets daily except 1 tablet on  Saturdays Recheck in 2 weeks.

## 2018-09-07 NOTE — Progress Notes (Signed)
Clinical Summary Ms. Deborah Maddox is a 63 y.o.female seen today for follow up of the following medical problems.   1. Afib - rare palpitations - compliant with meds. Not interested in NOACs.   2. HTN - remains compliant with meds  3. CAD - unclear history based on notes from prior cardiologists. No documented caths in our system - very long history over 10 years of chronic atypical chest pain.  - dulll pain in chest. Lasts a few seconds. Not exertional. 7-8/10 in severity. 2-3 times per week.   4. CVA - chornic left sided weakness.  - currently wheel chair bound  5. History of PE - compliant with coumadin.  Past Medical History:  Diagnosis Date  . A-fib ([place])   . Anxiety   . Arthritis   . Cancer (HCC)    cervical  . 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 Coronary artery disease   . Foot drop    left foot  . Hypertension   . Left foot drop   . 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 PONV (postoperative nausea and vomiting)   . Pulmonary emboli ([place]) 2014  . Seizures ([place])    last one 10 years ago  . Sleep apnea    Refuses to use machine because of recurrent pneumonia  . Stroke [facility])    at age 19, left sided weakness-complete paralysis of left arm.     Allergies  Allergen Reactions  . Vancomycin Hives and Itching  . Latex Rash  . Other Rash    HEART  MONITOR TAPE: severe rash, bleeding     Current Outpatient Medications  Medication Sig Dispense Refill  . atorvastatin (LIPITOR) 10 MG tablet Take 10 mg by mouth at bedtime.    . Cholecalciferol (VITAMIN D) 2000 UNITS CAPS Take 2,000 Units by mouth daily. At 1300    . cyclobenzaprine (FLEXERIL) 10 MG tablet Take 10 mg by mouth 3 (three) times daily as needed (headaches).    . diclofenac sodium (VOLTAREN) 1 % GEL Apply 2-4 g topically 3 (three) times daily. to knees    . diltiazem (TIAZAC) 300 MG 24 hr capsule Take 300 mg by mouth daily.    Marland Kitchen docusate sodium (COLACE) 100 MG capsule Take  100 mg by mouth 2 (two) times daily.     . DULoxetine (CYMBALTA) 60 MG capsule Take 60 mg by mouth daily.      . fluticasone (FLONASE) 50 MCG/ACT nasal spray Place 2 sprays into both nostrils daily.    Marland Kitchen gabapentin (NEURONTIN) 300 MG capsule Take 300-900 mg by mouth See admin instructions. Take 300 mg by mouth every morning and evening and 900 mg at bedtime    . Linaclotide (LINZESS) 290 MCG CAPS Take 1 capsule by mouth daily. 30 minutes before breakfast. 30 capsule 3  . lisinopril (PRINIVIL,ZESTRIL) 40 MG tablet Take 40 mg by mouth daily.      Marland Kitchen loratadine (CLARITIN) 10 MG tablet Take 10 mg by mouth daily.    . montelukast (SINGULAIR) 10 MG tablet Take 10 mg by mouth daily.    Marland Kitchen omeprazole (PRILOSEC) 20 MG capsule Take 20 mg by mouth daily.      Marland Kitchen oxybutynin (DITROPAN) 5 MG tablet Take 5 mg by mouth 2 (two) times daily.    Marland Kitchen senna (SENOKOT) 8.6 MG TABS Take 17.2 mg by mouth daily.     Marland Kitchen topiramate (TOPAMAX) 50 MG tablet  Take 50-100 mg by mouth See admin instructions. Take 50 mg in the morning and 100 mg at bedtime    . vitamin B-12 (CYANOCOBALAMIN) 500 MCG tablet Take 500 mcg by mouth daily.    Marland Kitchen warfarin (COUMADIN) 2.5 MG tablet Take coumadin 2 tablets tonight then increase dose to 1 1/2 tablets daily except 1 tablet on Saturdays 45 tablet 3   No current facility-administered medications for this visit.      Past Surgical History:  Procedure Laterality Date  . BREAST SURGERY    . CERVICAL CONE BIOPSY     cervical cancer  . COLONOSCOPY  2006   Dr. Anthony Sar: normal colonoscopy   . COLONOSCOPY WITH PROPOFOL N/A 10/06/2012   Dr. Gala Romney: prominent external hemorrhoids, colonic lipoma. hepatic flexure polyp, tubular adenoma.   . COLONOSCOPY WITH PROPOFOL N/A 03/17/2018   Procedure: COLONOSCOPY WITH PROPOFOL;  Surgeon: Daneil Dolin, MD;  [facility];  Service: Endoscopy;  Laterality: N/A;  10:00am  . DILATION AND CURETTAGE OF UTERUS    . LEG SURGERY    . LEG SURGERY     left  .  OPEN REDUCTION INTERNAL FIXATION (ORIF) HAND     right hand and arm  . POLYPECTOMY N/A 10/06/2012   Procedure: POLYPECTOMY;  Surgeon: Daneil Dolin, MD;  Location: [facility] ORS;  Service: Endoscopy;  Laterality: N/A;  hepatic flexure polyp  . TONSILLECTOMY       Allergies  Allergen Reactions  . Vancomycin Hives and Itching  . Latex Rash  . Other Rash    HEART  MONITOR TAPE: severe rash, bleeding      Family History  Problem Relation Age of Onset  . Diabetes Mother   . Liver cancer Mother   . Diabetes Sister   . Cancer Brother        "all over"   . Cancer Other        uncle  . Diabetes Other   . Colon cancer Unknown        unsure  . Mesothelioma Father      Social History Ms. Deborah Maddox reports that she quit smoking about 37 years ago. She quit after 5.00 years of use. She has never used smokeless tobacco. Ms. Deborah Maddox reports no history of alcohol use.   Review of Systems CONSTITUTIONAL: No weight loss, fever, chills, weakness or fatigue.  HEENT: Eyes: No visual loss, blurred vision, double vision or yellow sclerae.No hearing loss, sneezing, congestion, runny nose or sore throat.  SKIN: No rash or itching.  CARDIOVASCULAR: per hpi RESPIRATORY: No shortness of breath, cough or sputum.  GASTROINTESTINAL: No anorexia, nausea, vomiting or diarrhea. No abdominal pain or blood.  GENITOURINARY: No burning on urination, no polyuria NEUROLOGICAL: chronic left sided weakness MUSCULOSKELETAL: No muscle, back pain, joint pain or stiffness.  LYMPHATICS: No enlarged nodes. No history of splenectomy.  PSYCHIATRIC: No history of depression or anxiety.  ENDOCRINOLOGIC: No reports of sweating, cold or heat intolerance. No polyuria or polydipsia.  Marland Kitchen   Physical Examination Vitals:   09/07/18 0956  BP: 126/64  Pulse: 84  SpO2: 95%   Vitals:   09/07/18 0956  Weight: 276 lb (125.2 kg)  Height: 5\' 3"  (1.6 m)    Gen: resting comfortably, no acute distress HEENT: no scleral icterus, pupils  equal round and reactive, no palptable cervical adenopathy,  CV: RRR, no m/r/g, no jvd Resp: Clear to auscultation bilaterally GI: abdomen is soft, non-tender, non-distended, normal bowel sounds, no hepatosplenomegaly MSK: extremities are  warm, no edema.  Skin: warm, no rash Neuro:  LUE and LLE weakness Psych: appropriate affect   Assessment and Plan  1. Afib - mild infrequent palpitatinos. Try increasing dilt to 360mg  daily.  - not intersted in NOACs, continue coumadin  2. HTN - she is at goal, continue current meds  3. CAD - unclear history. This is mentioned in her previous cardiologists, I am not able to find any details - chronic over 10 year history of atypical chest pain - she is poor candidate for ischemic testing given prior CVA with left sided weakness, wheel chair bound, poor functional capacity. Symptosm remain atypical - will try increasing her dilt to 360mg  dialy for possible antianginal effects   F/u 4 months   Arnoldo Lenis, M.D.

## 2018-09-07 NOTE — Patient Instructions (Signed)
Medication Instructions:  INCREASE DILTIAZEM TO 360 MG ONCE DAILY   Labwork: NONE  Testing/Procedures: NONE  Follow-Up: Your physician wants you to follow-up in: 4 MONTHS. You will receive a reminder letter in the mail two months in advance. If you don't receive a letter, please call our office to schedule the follow-up appointment.   Any Other Special Instructions Will Be Listed Below (If Applicable).     If you need a refill on your cardiac medications before your next appointment, please call your pharmacy.

## 2018-09-10 ENCOUNTER — Emergency Department (HOSPITAL_COMMUNITY): Payer: Medicare Other

## 2018-09-10 ENCOUNTER — Other Ambulatory Visit: Payer: Self-pay

## 2018-09-10 ENCOUNTER — Emergency Department (HOSPITAL_COMMUNITY)
Admission: EM | Admit: 2018-09-10 | Discharge: 2018-09-10 | Disposition: A | Discharge status: Home / Self Care | Payer: Medicare Other | Attending: Emergency Medicine | Admitting: Emergency Medicine

## 2018-09-10 ENCOUNTER — Encounter (HOSPITAL_COMMUNITY): Payer: Self-pay | Admitting: Emergency Medicine

## 2018-09-10 DIAGNOSIS — Y999 Unspecified external cause status: Secondary | ICD-10-CM | POA: Diagnosis not present

## 2018-09-10 DIAGNOSIS — Y92003 Bedroom of unspecified non-institutional (private) residence as the place of occurrence of the external cause: Secondary | ICD-10-CM | POA: Insufficient documentation

## 2018-09-10 DIAGNOSIS — Z79899 Other long term (current) drug therapy: Secondary | ICD-10-CM | POA: Insufficient documentation

## 2018-09-10 DIAGNOSIS — S30810A Abrasion of lower back and pelvis, initial encounter: Secondary | ICD-10-CM | POA: Diagnosis not present

## 2018-09-10 DIAGNOSIS — I2510 Atherosclerotic heart disease of native coronary artery without angina pectoris: Secondary | ICD-10-CM | POA: Diagnosis not present

## 2018-09-10 DIAGNOSIS — Z87891 Personal history of nicotine dependence: Secondary | ICD-10-CM | POA: Insufficient documentation

## 2018-09-10 DIAGNOSIS — Y92129 Unspecified place in nursing home as the place of occurrence of the external cause: Secondary | ICD-10-CM

## 2018-09-10 DIAGNOSIS — M25561 Pain in right knee: Secondary | ICD-10-CM | POA: Diagnosis not present

## 2018-09-10 DIAGNOSIS — X509XXA Other and unspecified overexertion or strenuous movements or postures, initial encounter: Secondary | ICD-10-CM | POA: Insufficient documentation

## 2018-09-10 DIAGNOSIS — Z8541 Personal history of malignant neoplasm of cervix uteri: Secondary | ICD-10-CM | POA: Diagnosis not present

## 2018-09-10 DIAGNOSIS — I10 Essential (primary) hypertension: Secondary | ICD-10-CM | POA: Diagnosis not present

## 2018-09-10 DIAGNOSIS — M25552 Pain in left hip: Secondary | ICD-10-CM | POA: Insufficient documentation

## 2018-09-10 DIAGNOSIS — Y939 Activity, unspecified: Secondary | ICD-10-CM | POA: Diagnosis not present

## 2018-09-10 DIAGNOSIS — S300XXA Contusion of lower back and pelvis, initial encounter: Secondary | ICD-10-CM | POA: Diagnosis not present

## 2018-09-10 DIAGNOSIS — K649 Unspecified hemorrhoids: Secondary | ICD-10-CM | POA: Insufficient documentation

## 2018-09-10 DIAGNOSIS — M545 Low back pain: Secondary | ICD-10-CM | POA: Diagnosis present

## 2018-09-10 DIAGNOSIS — W19XXXA Unspecified fall, initial encounter: Secondary | ICD-10-CM

## 2018-09-10 DIAGNOSIS — Z7901 Long term (current) use of anticoagulants: Secondary | ICD-10-CM | POA: Diagnosis not present

## 2018-09-10 MED ORDER — ACETAMINOPHEN 325 MG PO TABS
650.0000 mg | ORAL_TABLET | Freq: Once | ORAL | Status: AC
  Administered 2018-09-10: 650 mg via ORAL
  Filled 2018-09-10: qty 2

## 2018-09-10 MED ORDER — PRAMOXINE HCL 1 % RE FOAM: Freq: Three times a day (TID) | RECTAL | Status: DC | PRN

## 2018-09-10 MED ORDER — PRAMOXINE HCL 1 % RE FOAM
Freq: Three times a day (TID) | RECTAL | Status: DC | PRN
  Administered 2018-09-10: 08:00:00 via RECTAL
  Filled 2018-09-10 (×2): qty 15

## 2018-09-10 NOTE — Discharge Instructions (Addendum)
The x-rays of your tailbone, left hip, and right knee show some arthritis in your right knee but otherwise there were no acute changes.  You are noted to have a hemorrhoid in your rectal area.  This is not from your fall it was present before.  Use the Proctofoam for comfort.  You can sit in a sitz bath for comfort.  You can sit on a pillow for comfort.  You can take acetaminophen 650 mg 4 times a day as needed for pain.  Keep your abrasions clean and dry so they do not get infected.

## 2018-09-10 NOTE — ED Notes (Signed)
Patient transported to MRI 

## 2018-09-10 NOTE — ED Notes (Signed)
EMS notified for transport back to [facility].

## 2018-09-10 NOTE — ED Triage Notes (Signed)
Pt states she was trying to get out of bed and fell. Pt c/o left hip, right knee and left foot pain. Pt has superficial scratch to the left buttocks and puncture to left groin area bleeding controlled.

## 2018-09-10 NOTE — ED Notes (Signed)
Waiting for Proctofoam to come from pharmacy.

## 2018-09-10 NOTE — ED Provider Notes (Signed)
[facility] Provider Note   CSN: [id number] Arrival date & time: 09/10/18  0503  Time seen 5:38 AM   History   Chief Complaint Chief Complaint  Patient presents with  . Fall    HPI Deborah Maddox is a 63 y.o. female.  HPI patient states she was trying to get from her bed into her wheelchair and she has left foot drop and her left foot gave out and she sat down on a side table.  She did not fall off the table.  She did not hit her head or have loss of consciousness.  She complains of pain in her tailbone and states "my butt hole is split".  She complains of pain in her left hip and thinks may be a pop.  She also states she has some pain in her right knee.  Patient is on Coumadin.  She is unsure what time it happened.  She cannot tell me if EMS was called right away or if she waited a while.  PCP Megan Mans, NP   Past Medical History:  Diagnosis Date  . A-fib ([place])   . Anxiety   . Arthritis   . Cancer (HCC)    cervical  . 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 Coronary artery disease   . Foot drop    left foot  . Hypertension   . Left foot drop   . 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 PONV (postoperative nausea and vomiting)   . Pulmonary emboli ([place]) 2014  . Seizures ([place])    last one 10 years ago  . Sleep apnea    Refuses to use machine because of recurrent pneumonia  . Stroke [facility])    at age 74, left sided weakness-complete paralysis of left arm.    Patient Active Problem List   Diagnosis Date Noted  . Urinary tract infection without hematuria   . Obesity, Class III, BMI 40-49.9 (morbid obesity) ([place])   . Sepsis due to undetermined organism ([place]) 04/12/2018  . Anemia 04/12/2018  . Altered mental status 04/12/2018  . Supratherapeutic INR 04/12/2018  . H/O adenomatous polyp of colon 01/07/2018  . Lower back pain 09/28/2017  . Seizures ([place]) 09/28/2017  . MRSA (methicillin resistant staph aureus) positive 09/28/2017    . Cellulitis of abdominal wall 09/27/2017  . Sleep apnea 09/27/2017  . Anxiety 09/27/2017  . Coronary artery disease 09/27/2017  . Foot drop 06/01/2017  . Encounter for therapeutic drug monitoring 09/14/2013  . Long term (current) use of anticoagulants 12/14/2012  . Atrial fibrillation ([place]) 12/14/2012  . Acute pulmonary embolism ([place]) 12/05/2012  . Atrial flutter ([place]) 12/05/2012  . Constipation 09/28/2012  . Rectal bleeding 09/28/2012  . Heart palpitations 02/01/2012  . Hypertension 02/01/2012  . Chest pain at rest 02/01/2012    Past Surgical History:  Procedure Laterality Date  . BREAST SURGERY    . CERVICAL CONE BIOPSY     cervical cancer  . COLONOSCOPY  2006   Dr. Anthony Sar: normal colonoscopy   . COLONOSCOPY WITH PROPOFOL N/A 10/06/2012   Dr. Gala Romney: prominent external hemorrhoids, colonic lipoma. hepatic flexure polyp, tubular adenoma.   . COLONOSCOPY WITH PROPOFOL N/A 03/17/2018   Procedure: COLONOSCOPY WITH PROPOFOL;  Surgeon: Daneil Dolin, MD;  [facility];  Service: Endoscopy;  Laterality: N/A;  10:00am  . DILATION AND CURETTAGE OF UTERUS    . LEG SURGERY    .  LEG SURGERY     left  . OPEN REDUCTION INTERNAL FIXATION (ORIF) HAND     right hand and arm  . POLYPECTOMY N/A 10/06/2012   Procedure: POLYPECTOMY;  Surgeon: Daneil Dolin, MD;  Location: [facility] ORS;  Service: Endoscopy;  Laterality: N/A;  hepatic flexure polyp  . TONSILLECTOMY       OB History    Gravida  3   Para  2   Term  2   Preterm      AB  1   Living        SAB  1   TAB      Ectopic      Multiple      Live Births               Home Medications    Prior to Admission medications   Medication Sig Start Date End Date Taking? Authorizing Provider  atorvastatin (LIPITOR) 10 MG tablet Take 10 mg by mouth at bedtime.   Yes [provider]  Cholecalciferol (VITAMIN D) 2000 UNITS CAPS Take 2,000 Units by mouth daily. At 1300   Yes [provider]   cyclobenzaprine (FLEXERIL) 10 MG tablet Take 10 mg by mouth 3 (three) times daily as needed (headaches).   Yes [provider]  diclofenac sodium (VOLTAREN) 1 % GEL Apply 2-4 g topically 3 (three) times daily. to knees   Yes [provider]  dicyclomine (BENTYL) 20 MG tablet Take 20 mg by mouth 3 (three) times daily as needed for spasms.   Yes [provider]  diltiazem (CARDIZEM CD) 360 MG 24 hr capsule Take 1 capsule (360 mg total) by mouth daily. 09/07/18  Yes BranchAlphonse Guild, MD  docusate sodium (COLACE) 100 MG capsule Take 100 mg by mouth 2 (two) times daily.    Yes [provider]  DULoxetine (CYMBALTA) 60 MG capsule Take 60 mg by mouth daily.     Yes [provider]  fluticasone (FLONASE) 50 MCG/ACT nasal spray Place 2 sprays into both nostrils daily.   Yes [provider]  gabapentin (NEURONTIN) 300 MG capsule Take 300-900 mg by mouth See admin instructions. Take 300 mg by mouth every morning and evening and 900 mg at bedtime   Yes [provider]  Linaclotide (LINZESS) 290 MCG CAPS Take 1 capsule by mouth daily. 30 minutes before breakfast. 10/13/12  Yes Annitta Needs, NP  lisinopril (PRINIVIL,ZESTRIL) 40 MG tablet Take 40 mg by mouth daily.     Yes [provider]  loratadine (CLARITIN) 10 MG tablet Take 10 mg by mouth daily.   Yes [provider]  montelukast (SINGULAIR) 10 MG tablet Take 10 mg by mouth daily.   Yes [provider]  omeprazole (PRILOSEC) 20 MG capsule Take 20 mg by mouth daily.     Yes [provider]  oxybutynin (DITROPAN) 5 MG tablet Take 5 mg by mouth 2 (two) times daily.   Yes [provider]  senna (SENOKOT) 8.6 MG TABS Take 17.2 mg by mouth daily.    Yes [provider]  topiramate (TOPAMAX) 50 MG tablet Take 50-100 mg by mouth See admin instructions. Take 50 mg in the morning and 100 mg at bedtime   Yes [provider]  vitamin B-12  (CYANOCOBALAMIN) 500 MCG tablet Take 500 mcg by mouth daily.   Yes [provider]  warfarin (COUMADIN) 2.5 MG tablet Take coumadin 2 tablets tonight then increase dose to 1  1/2 tablets daily except 1 tablet on Saturdays 07/27/18 09/10/18 Yes Branch, Alphonse Guild, MD    Family History Family History  Problem Relation Age of Onset  . Diabetes Mother   . Liver cancer Mother   . Diabetes Sister   . Cancer Brother        "all over"   . Cancer Other        uncle  . Diabetes Other   . Colon cancer Other        unsure  . Mesothelioma Father     Social History Social History   Tobacco Use  . Smoking status: Former Smoker    Years: 5.00    Last attempt to quit: 03/25/1981    Years since quitting: 37.4  . Smokeless tobacco: Never Used  Substance Use Topics  . Alcohol use: No  . Drug use: No  Lives in a nursing home   Allergies   Vancomycin; Latex; and Other   Review of Systems Review of Systems  All other systems reviewed and are negative.    Physical Exam Updated Vital Signs BP 117/75 (BP Location: Right Arm)   Pulse 80   Temp (!) 97.5 F (36.4 C) (Oral)   Resp 18   Ht 5\' 3"  (1.6 m)   Wt 125.2 kg   SpO2 96%   BMI 48.89 kg/m   Vital signs normal    Physical Exam Vitals signs and nursing note reviewed. Exam conducted with a chaperone present.  Constitutional:      General: She is not in acute distress.    Appearance: Normal appearance. She is well-developed. She is not ill-appearing or toxic-appearing.  HENT:     Head: Normocephalic and atraumatic.     Right Ear: External ear normal.     Left Ear: External ear normal.     Nose: Nose normal. No mucosal edema or rhinorrhea.     Mouth/Throat:     Mouth: Mucous membranes are dry.     Dentition: No dental abscesses.     Pharynx: No uvula swelling.  Eyes:     Conjunctiva/sclera: Conjunctivae normal.     Pupils: Pupils are equal, round, and reactive to light.  Neck:     Musculoskeletal: Full passive  range of motion without pain, normal range of motion and neck supple.  Cardiovascular:     Rate and Rhythm: Normal rate and regular rhythm.     Heart sounds: Normal heart sounds. No murmur. No friction rub. No gallop.   Pulmonary:     Effort: Pulmonary effort is normal. No respiratory distress.     Breath sounds: Normal breath sounds. No wheezing, rhonchi or rales.  Chest:     Chest wall: No tenderness or crepitus.  Abdominal:     General: Bowel sounds are normal. There is no distension.     Palpations: Abdomen is soft.     Tenderness: There is no abdominal tenderness. There is no guarding or rebound.     Comments: When I lift up patient's pannus she has a small area where the skin has split where the pannus joints her thigh.  There is some blood there but is not actively bleeding.  The area that split is approximately 4 mm long.  Genitourinary:    Comments: We rolled the patient over onto her right side.  When I examine her rectum she is noted to have a small hemorrhoid from 9-10 o'clock without bleeding.  She is noted to have a linear scratch that  starts near her medial left buttock that goes towards her left abdomen with minimal bleeding. Musculoskeletal: Normal range of motion.        General: No tenderness.     Comments: Patient states she has some tenderness in her right knee, there is no abrasions or bruising seen, I do not appreciate any effusion.  Patient states her left hip hurts however she can flex her leg without difficulty.  There is no obvious external or internal rotation or shortening of her legs.  Skin:    General: Skin is warm and dry.     Coloration: Skin is not pale.     Findings: No erythema or rash.  Neurological:     Mental Status: She is alert and oriented to person, place, and time.     Cranial Nerves: No cranial nerve deficit.  Psychiatric:        Mood and Affect: Mood normal. Mood is not anxious.        Speech: Speech normal.        Behavior: Behavior normal.         Thought Content: Thought content normal.      ED Treatments / Results  Labs (all labs ordered are listed, but only abnormal results are displayed) Labs Reviewed - No data to display  EKG None  Radiology Dg Sacrum/coccyx  Result Date: 09/10/2018 CLINICAL DATA:  Pain after fall. EXAM: SACRUM AND COCCYX - 2+ VIEW COMPARISON:  None. FINDINGS: There is no evidence of fracture or other focal bone lesions. IMPRESSION: Negative. Electronically Signed   By: Marijo Conception, M.D.   On: 09/10/2018 07:09   Dg Knee Complete 4 Views Right  Result Date: 09/10/2018 CLINICAL DATA:  Right knee pain after fall. EXAM: RIGHT KNEE - COMPLETE 4+ VIEW COMPARISON:  Radiographs of July 01, 2018. FINDINGS: No evidence of fracture, dislocation, or joint effusion. Moderate narrowing of the medial and lateral joint space is noted, as well as severe narrowing of the patellofemoral space. Large ossified intra-articular loose bodies are noted in the suprapatellar space. Soft tissues are unremarkable. IMPRESSION: Moderate to severe degenerative joint disease. Large ossified intra-articular loose bodies in suprapatellar space. No acute abnormality seen in the right knee. Electronically Signed   By: Marijo Conception, M.D.   On: 09/10/2018 07:07   Dg Hip Unilat W Or Wo Pelvis 2-3 Views Left  Result Date: 09/10/2018 CLINICAL DATA:  Left hip pain after fall. EXAM: DG HIP (WITH OR WITHOUT PELVIS) 2-3V LEFT COMPARISON:  Radiographs of July 19, 2018. FINDINGS: There is no evidence of hip fracture or dislocation. There is no evidence of arthropathy or other focal bone abnormality. IMPRESSION: Negative. Electronically Signed   By: Marijo Conception, M.D.   On: 09/10/2018 07:05    Procedures Procedures (including critical care time)  Medications Ordered in ED Medications  pramoxine (PROCTOFOAM) 1 % foam (has no administration in time range)  acetaminophen (TYLENOL) tablet 650 mg (has no administration in time range)    pramoxine (PROCTOFOAM) 1 % foam (has no administration in time range)     Initial Impression / Assessment and Plan / ED Course  I have reviewed the triage vital signs and the nursing notes.  Pertinent labs & imaging results that were available during my care of the patient were reviewed by me and considered in my medical decision making (see chart for details).     Patient keeps repeating that her she is having pain in her rectum.  She  was given Proctofoam for hemorrhoid.  She states that she is aware she has a hemorrhoid.  Patient's x-rays do not show fracture.  She was discharged back to her facility.  Patient is noted to be on gabapentin and when I review the [place] she used to be on #120 hydrocodone 10 monthly but that was in 2018.  Final Clinical Impressions(s) / ED Diagnoses   Final diagnoses:  Fall at nursing home, initial encounter  Acute pain of right knee  Hemorrhoids, unspecified hemorrhoid type  Left hip pain  Abrasion of left buttock, initial encounter  Contusion of coccyx, initial encounter    ED Discharge Orders    None    OTC acetaminophen  Plan discharge  Rolland Porter, MD, Barbette Or, MD 09/10/18 [phone]

## 2018-09-21 ENCOUNTER — Ambulatory Visit (INDEPENDENT_AMBULATORY_CARE_PROVIDER_SITE_OTHER): Payer: Medicare Other | Admitting: Pharmacist

## 2018-09-21 DIAGNOSIS — I4891 Unspecified atrial fibrillation: Secondary | ICD-10-CM

## 2018-09-21 DIAGNOSIS — I2699 Other pulmonary embolism without acute cor pulmonale: Secondary | ICD-10-CM

## 2018-09-21 DIAGNOSIS — Z5181 Encounter for therapeutic drug level monitoring: Secondary | ICD-10-CM

## 2018-09-21 LAB — POCT INR: INR: 3 (ref 2.0–3.0)

## 2018-09-21 NOTE — Patient Instructions (Signed)
Description   Continue to 1 1/2 tablets daily except 1 tablet on  Saturdays Recheck in 3 weeks.

## 2018-09-23 ENCOUNTER — Telehealth: Payer: Self-pay | Admitting: *Deleted

## 2018-09-23 NOTE — Telephone Encounter (Signed)
The NP at [facility] took the patient off her coumadin and put her Eliquis 5mg  so she wouldn't have to come so often to be checked

## 2018-09-23 NOTE — Telephone Encounter (Addendum)
Med list updated. Called pt to inform her that f/u INR check has been canceled.

## 2018-12-27 ENCOUNTER — Encounter (HOSPITAL_COMMUNITY): Payer: Self-pay | Admitting: Emergency Medicine

## 2018-12-27 ENCOUNTER — Emergency Department (HOSPITAL_COMMUNITY): Payer: Medicare Other

## 2018-12-27 ENCOUNTER — Emergency Department (HOSPITAL_COMMUNITY)
Admission: EM | Admit: 2018-12-27 | Discharge: 2018-12-27 | Disposition: A | Discharge status: Home / Self Care | Payer: Medicare Other | Attending: Emergency Medicine | Admitting: Emergency Medicine

## 2018-12-27 ENCOUNTER — Other Ambulatory Visit: Payer: Self-pay

## 2018-12-27 DIAGNOSIS — W19XXXA Unspecified fall, initial encounter: Secondary | ICD-10-CM

## 2018-12-27 DIAGNOSIS — I4891 Unspecified atrial fibrillation: Secondary | ICD-10-CM | POA: Diagnosis not present

## 2018-12-27 DIAGNOSIS — Y9301 Activity, walking, marching and hiking: Secondary | ICD-10-CM | POA: Insufficient documentation

## 2018-12-27 DIAGNOSIS — I10 Essential (primary) hypertension: Secondary | ICD-10-CM | POA: Insufficient documentation

## 2018-12-27 DIAGNOSIS — Y92129 Unspecified place in nursing home as the place of occurrence of the external cause: Secondary | ICD-10-CM | POA: Diagnosis not present

## 2018-12-27 DIAGNOSIS — S8012XA Contusion of left lower leg, initial encounter: Secondary | ICD-10-CM | POA: Diagnosis not present

## 2018-12-27 DIAGNOSIS — Z87891 Personal history of nicotine dependence: Secondary | ICD-10-CM | POA: Insufficient documentation

## 2018-12-27 DIAGNOSIS — Z79899 Other long term (current) drug therapy: Secondary | ICD-10-CM | POA: Insufficient documentation

## 2018-12-27 DIAGNOSIS — S76012A Strain of muscle, fascia and tendon of left hip, initial encounter: Secondary | ICD-10-CM

## 2018-12-27 DIAGNOSIS — W010XXA Fall on same level from slipping, tripping and stumbling without subsequent striking against object, initial encounter: Secondary | ICD-10-CM | POA: Insufficient documentation

## 2018-12-27 DIAGNOSIS — I2510 Atherosclerotic heart disease of native coronary artery without angina pectoris: Secondary | ICD-10-CM | POA: Diagnosis not present

## 2018-12-27 DIAGNOSIS — Y998 Other external cause status: Secondary | ICD-10-CM | POA: Diagnosis not present

## 2018-12-27 DIAGNOSIS — S79912A Unspecified injury of left hip, initial encounter: Secondary | ICD-10-CM | POA: Diagnosis present

## 2018-12-27 MED ORDER — HYDROCODONE-ACETAMINOPHEN 5-325 MG PO TABS
1.0000 | ORAL_TABLET | Freq: Once | ORAL | Status: AC
  Administered 2018-12-27: 1 via ORAL
  Filled 2018-12-27: qty 1

## 2018-12-27 NOTE — ED Triage Notes (Signed)
Pt from [facility]. Pt "went to sit down in a chair and there was not a chair there so I fell on to my left side." Pt C/O left hand pain. No obvious deformity. Pt denies hitting head. Denies syncope.

## 2018-12-27 NOTE — ED Provider Notes (Signed)
[facility] EMERGENCY DEPARTMENT Provider Note   CSN: [id number] Arrival date & time: 12/27/18  0251    History   Chief Complaint Chief Complaint  Patient presents with  . Fall    HPI Deborah Maddox is a 63 y.o. female.     The history is provided by the patient.  Fall  This is a new problem. Episode onset: just prior to arrival. The problem has not changed since onset.Pertinent negatives include no chest pain, no abdominal pain and no headaches. Exacerbated by: movement. The symptoms are relieved by rest.  She presents from her nursing facility after a fall.  She was trying to sit down in wheelchair, then fell on her left side.  No head injury or LOC.  Reports pain is in left leg and hip.  She also reported left hand pain.  She has a previous history of stroke with left-sided paresis. No new weakness is reported.  Past Medical History:  Diagnosis Date  . A-fib ([place])   . Anxiety   . Arthritis   . Cancer (HCC)    cervical  . 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 Coronary artery disease   . Foot drop    left foot  . Hypertension   . Left foot drop   . 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 PONV (postoperative nausea and vomiting)   . Pulmonary emboli ([place]) 2014  . Seizures ([place])    last one 10 years ago  . Sleep apnea    Refuses to use machine because of recurrent pneumonia  . Stroke [facility])    at age 43, left sided weakness-complete paralysis of left arm.    Patient Active Problem List   Diagnosis Date Noted  . Urinary tract infection without hematuria   . Obesity, Class III, BMI 40-49.9 (morbid obesity) ([place])   . Sepsis due to undetermined organism ([place]) 04/12/2018  . Anemia 04/12/2018  . Altered mental status 04/12/2018  . Supratherapeutic INR 04/12/2018  . H/O adenomatous polyp of colon 01/07/2018  . Lower back pain 09/28/2017  . Seizures ([place]) 09/28/2017  . MRSA (methicillin resistant staph aureus) positive 09/28/2017  . Cellulitis of  abdominal wall 09/27/2017  . Sleep apnea 09/27/2017  . Anxiety 09/27/2017  . Coronary artery disease 09/27/2017  . Foot drop 06/01/2017  . Long term (current) use of anticoagulants 12/14/2012  . Atrial fibrillation ([place]) 12/14/2012  . Acute pulmonary embolism ([place]) 12/05/2012  . Atrial flutter ([place]) 12/05/2012  . Constipation 09/28/2012  . Rectal bleeding 09/28/2012  . Heart palpitations 02/01/2012  . Hypertension 02/01/2012  . Chest pain at rest 02/01/2012    Past Surgical History:  Procedure Laterality Date  . BREAST SURGERY    . CERVICAL CONE BIOPSY     cervical cancer  . COLONOSCOPY  2006   Dr. Anthony Sar: normal colonoscopy   . COLONOSCOPY WITH PROPOFOL N/A 10/06/2012   Dr. Gala Romney: prominent external hemorrhoids, colonic lipoma. hepatic flexure polyp, tubular adenoma.   . COLONOSCOPY WITH PROPOFOL N/A 03/17/2018   Procedure: COLONOSCOPY WITH PROPOFOL;  Surgeon: Daneil Dolin, MD;  Location: [facility] ENDO SUITE;  Service: Endoscopy;  Laterality: N/A;  10:00am  . DILATION AND CURETTAGE OF UTERUS    . LEG SURGERY    . LEG SURGERY     left  . OPEN REDUCTION INTERNAL FIXATION (ORIF) HAND     right hand and arm  . POLYPECTOMY N/A 10/06/2012  Procedure: POLYPECTOMY;  Surgeon: Daneil Dolin, MD;  Location: [facility] ORS;  Service: Endoscopy;  Laterality: N/A;  hepatic flexure polyp  . TONSILLECTOMY       OB History    Gravida  3   Para  2   Term  2   Preterm      AB  1   Living        SAB  1   TAB      Ectopic      Multiple      Live Births               Home Medications    Prior to Admission medications   Medication Sig Start Date End Date Taking? Authorizing Provider  atorvastatin (LIPITOR) 10 MG tablet Take 10 mg by mouth at bedtime.    [provider]  Cholecalciferol (VITAMIN D) 2000 UNITS CAPS Take 2,000 Units by mouth daily. At 1300    [provider]  cyclobenzaprine (FLEXERIL) 10 MG tablet Take 10 mg by mouth 3 (three) times daily as  needed (headaches).    [provider]  diclofenac sodium (VOLTAREN) 1 % GEL Apply 2-4 g topically 3 (three) times daily. to knees    [provider]  dicyclomine (BENTYL) 20 MG tablet Take 20 mg by mouth 3 (three) times daily as needed for spasms.    [provider]  diltiazem (CARDIZEM CD) 360 MG 24 hr capsule Take 1 capsule (360 mg total) by mouth daily. 09/07/18   Arnoldo Lenis, MD  docusate sodium (COLACE) 100 MG capsule Take 100 mg by mouth 2 (two) times daily.     [provider]  DULoxetine (CYMBALTA) 60 MG capsule Take 60 mg by mouth daily.      [provider]  fluticasone (FLONASE) 50 MCG/ACT nasal spray Place 2 sprays into both nostrils daily.    [provider]  gabapentin (NEURONTIN) 300 MG capsule Take 300-900 mg by mouth See admin instructions. Take 300 mg by mouth every morning and evening and 900 mg at bedtime    [provider]  Linaclotide (LINZESS) 290 MCG CAPS Take 1 capsule by mouth daily. 30 minutes before breakfast. 10/13/12   Annitta Needs, NP  lisinopril (PRINIVIL,ZESTRIL) 40 MG tablet Take 40 mg by mouth daily.      [provider]  loratadine (CLARITIN) 10 MG tablet Take 10 mg by mouth daily.    [provider]  montelukast (SINGULAIR) 10 MG tablet Take 10 mg by mouth daily.    [provider]  omeprazole (PRILOSEC) 20 MG capsule Take 20 mg by mouth daily.      [provider]  oxybutynin (DITROPAN) 5 MG tablet Take 5 mg by mouth 2 (two) times daily.    [provider]  senna (SENOKOT) 8.6 MG TABS Take 17.2 mg by mouth daily.     [provider]  topiramate (TOPAMAX) 50 MG tablet Take 50-100 mg by mouth See admin instructions. Take 50 mg in the morning and 100 mg at bedtime    [provider]  vitamin B-12 (CYANOCOBALAMIN) 500 MCG tablet Take 500 mcg by mouth daily.    [provider]    Family History Family History  Problem  Relation Age of Onset  . Diabetes Mother   . Liver cancer Mother   . Diabetes Sister   . Cancer Brother        "all over"   . Cancer  Other        uncle  . Diabetes Other   . Colon cancer Other        unsure  . Mesothelioma Father     Social History Social History   Tobacco Use  . Smoking status: Former Smoker    Years: 5.00    Last attempt to quit: 03/25/1981    Years since quitting: 37.7  . Smokeless tobacco: Never Used  Substance Use Topics  . Alcohol use: No  . Drug use: No     Allergies   Vancomycin; Latex; and Other   Review of Systems Review of Systems  Constitutional: Negative for fever.  Cardiovascular: Negative for chest pain.  Gastrointestinal: Negative for abdominal pain.  Musculoskeletal: Positive for arthralgias.  Neurological: Negative for syncope and headaches.  All other systems reviewed and are negative.    Physical Exam Updated Vital Signs BP 93/66   Pulse 93   Temp 98.1 F (36.7 C) (Oral)   Resp 18   SpO2 97%   Physical Exam CONSTITUTIONAL: chronically ill appearing HEAD: Normocephalic/atraumatic, no signs of trauma EYES: EOMI/PERRL ENMT: Mucous membranes moist NECK: supple no meningeal signs SPINE/BACK:entire spine nontender, No bruising/crepitance/stepoffs noted to spine CV: irregular LUNGS: Lungs are clear to auscultation bilaterally, no apparent distress ABDOMEN: soft, nontender, no rebound or guarding, bowel sounds noted throughout abdomen GU:no cva tenderness NEURO: Pt is awake/alert/appropriate  Left sided paresis noted EXTREMITIES: pulses normal/equal, full ROM, pelvis stable.  Tenderness to palpation and ROM of left hip.  Tenderness/bruising noted to left tibia surface No tenderness/deformity to left UE SKIN: warm, color normal PSYCH: no abnormalities of mood noted, alert and oriented to situation   ED Treatments / Results  Labs (all labs ordered are listed, but only abnormal results are displayed) Labs Reviewed - No  data to display  EKG None  Radiology Dg Tibia/fibula Left  Result Date: 12/27/2018 CLINICAL DATA:  Fall with left leg pain.  Initial encounter. EXAM: LEFT TIBIA AND FIBULA - 2 VIEW COMPARISON:  None. FINDINGS: High patella on the lateral view which is considered positional given leg extension. Osteopenia. Negative for fracture or dislocation. IMPRESSION: No acute finding. Electronically Signed   By: Monte Fantasia M.D.   On: 12/27/2018 04:53   Dg Hip Unilat W Or Wo Pelvis 2-3 Views Left  Result Date: 12/27/2018 CLINICAL DATA:  Fall with left hip pain EXAM: DG HIP (WITH OR WITHOUT PELVIS) 2-3V LEFT COMPARISON:  09/10/2018 FINDINGS: There is no evidence of hip fracture or dislocation. There is no evidence of arthropathy or other focal bone abnormality. Ring-like structure over the right hemipelvis was not seen previously and is presumably external to the patient. Moderately distended bladder. IMPRESSION: No evidence of fracture. Electronically Signed   By: Monte Fantasia M.D.   On: 12/27/2018 04:51    Procedures Procedures (including critical care time)  Medications Ordered in ED Medications - No data to display   Initial Impression / Assessment and Plan / ED Course  I have reviewed the triage vital signs and the nursing notes.  Pertinent   imaging results that were available during my care of the patient were reviewed by me and considered in my medical decision making (see chart for details).        Presents from nursing facility after a mechanical fall.  Denies head injury or LOC, no indication for CT head.   Only evidence f injury is to the left leg.  Will image left hip and left tib-fib  5:43 AM On recheck of the patient she is in no acute distress.  Her only pain is in her left leg.  No other signs of acute traumatic injury to head/spine/other extremities Of note, I first thought she was on coumadin but MAR from nursing facility indicates she is on eliquis   Final Clinical  Impressions(s) / ED Diagnoses   Final diagnoses:  Fall, initial encounter  Contusion of left lower extremity, initial encounter  Hip strain, left, initial encounter    ED Discharge Orders    None       Ripley Fraise, MD 12/27/18 [phone]

## 2018-12-27 NOTE — ED Notes (Signed)
Patient denies pain and is resting comfortably.  

## 2019-01-09 ENCOUNTER — Ambulatory Visit: Payer: Medicare Other | Admitting: Cardiology

## 2019-03-01 ENCOUNTER — Ambulatory Visit: Payer: Medicare Other | Admitting: Cardiology

## 2019-03-01 NOTE — Progress Notes (Deleted)
Clinical Summary Deborah Maddox is a 63 y.o.female seen today for follow up of the following medical problems.   1. Afib - rare palpitations - compliant with meds. Not interested in NOACs.   2. HTN - remains compliant with meds  3. CAD - unclear history based on notes from prior cardiologists. No documented caths in our system - very long history over 10 years of chronic atypical chest pain.  - dull pain in chest. Lasts a few seconds. Not exertional. 7-8/10 in severity. 2-3 times per week.   4. CVA -chornicleft sided weakness. - currently wheel chair bound  5. History of PE - compliant with coumadin.   Past Medical History:  Diagnosis Date  . A-fib ([place])   . Anxiety   . Arthritis   . Cancer (HCC)    cervical  . 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 Coronary artery disease   . Foot drop    left foot  . Hypertension   . Left foot drop   . 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 PONV (postoperative nausea and vomiting)   . Pulmonary emboli ([place]) 2014  . Seizures ([place])    last one 10 years ago  . Sleep apnea    Refuses to use machine because of recurrent pneumonia  . Stroke [facility])    at age 49, left sided weakness-complete paralysis of left arm.     Allergies  Allergen Reactions  . Vancomycin Hives and Itching  . Latex Rash  . Other Rash    HEART  MONITOR TAPE: severe rash, bleeding     Current Outpatient Medications  Medication Sig Dispense Refill  . atorvastatin (LIPITOR) 10 MG tablet Take 10 mg by mouth at bedtime.    . Cholecalciferol (VITAMIN D) 2000 UNITS CAPS Take 2,000 Units by mouth daily. At 1300    . cyclobenzaprine (FLEXERIL) 10 MG tablet Take 10 mg by mouth 3 (three) times daily as needed (headaches).    . diclofenac sodium (VOLTAREN) 1 % GEL Apply 2-4 g topically 3 (three) times daily. to knees    . dicyclomine (BENTYL) 20 MG tablet Take 20 mg by mouth 3 (three) times daily as needed for spasms.    Marland Kitchen diltiazem  (CARDIZEM CD) 360 MG 24 hr capsule Take 1 capsule (360 mg total) by mouth daily. 90 capsule 3  . docusate sodium (COLACE) 100 MG capsule Take 100 mg by mouth 2 (two) times daily.     . DULoxetine (CYMBALTA) 60 MG capsule Take 60 mg by mouth daily.      . fluticasone (FLONASE) 50 MCG/ACT nasal spray Place 2 sprays into both nostrils daily.    Marland Kitchen gabapentin (NEURONTIN) 300 MG capsule Take 300-900 mg by mouth See admin instructions. Take 300 mg by mouth every morning and evening and 900 mg at bedtime    . Linaclotide (LINZESS) 290 MCG CAPS Take 1 capsule by mouth daily. 30 minutes before breakfast. 30 capsule 3  . lisinopril (PRINIVIL,ZESTRIL) 40 MG tablet Take 40 mg by mouth daily.      Marland Kitchen loratadine (CLARITIN) 10 MG tablet Take 10 mg by mouth daily.    . montelukast (SINGULAIR) 10 MG tablet Take 10 mg by mouth daily.    Marland Kitchen omeprazole (PRILOSEC) 20 MG capsule Take 20 mg by mouth daily.      Marland Kitchen oxybutynin (DITROPAN) 5 MG tablet Take 5 mg by mouth 2 (two) times daily.    Marland Kitchen  senna (SENOKOT) 8.6 MG TABS Take 17.2 mg by mouth daily.     Marland Kitchen topiramate (TOPAMAX) 50 MG tablet Take 50-100 mg by mouth See admin instructions. Take 50 mg in the morning and 100 mg at bedtime    . vitamin B-12 (CYANOCOBALAMIN) 500 MCG tablet Take 500 mcg by mouth daily.     No current facility-administered medications for this visit.      Past Surgical History:  Procedure Laterality Date  . BREAST SURGERY    . CERVICAL CONE BIOPSY     cervical cancer  . COLONOSCOPY  2006   Dr. Anthony Sar: normal colonoscopy   . COLONOSCOPY WITH PROPOFOL N/A 10/06/2012   Dr. Gala Romney: prominent external hemorrhoids, colonic lipoma. hepatic flexure polyp, tubular adenoma.   . COLONOSCOPY WITH PROPOFOL N/A 03/17/2018   Procedure: COLONOSCOPY WITH PROPOFOL;  Surgeon: Daneil Dolin, MD;  Location: [facility] ENDO SUITE;  Service: Endoscopy;  Laterality: N/A;  10:00am  . DILATION AND CURETTAGE OF UTERUS    . LEG SURGERY    . LEG SURGERY     left  . OPEN  REDUCTION INTERNAL FIXATION (ORIF) HAND     right hand and arm  . POLYPECTOMY N/A 10/06/2012   Procedure: POLYPECTOMY;  Surgeon: Daneil Dolin, MD;  Location: [facility] ORS;  Service: Endoscopy;  Laterality: N/A;  hepatic flexure polyp  . TONSILLECTOMY       Allergies  Allergen Reactions  . Vancomycin Hives and Itching  . Latex Rash  . Other Rash    HEART  MONITOR TAPE: severe rash, bleeding      Family History  Problem Relation Age of Onset  . Diabetes Mother   . Liver cancer Mother   . Diabetes Sister   . Cancer Brother        "all over"   . Cancer Other        uncle  . Diabetes Other   . Colon cancer Other        unsure  . Mesothelioma Father      Social History Deborah Maddox reports that she quit smoking about 37 years ago. She quit after 5.00 years of use. She has never used smokeless tobacco. Deborah Maddox reports no history of alcohol use.   Review of Systems CONSTITUTIONAL: No weight loss, fever, chills, weakness or fatigue.  HEENT: Eyes: No visual loss, blurred vision, double vision or yellow sclerae.No hearing loss, sneezing, congestion, runny nose or sore throat.  SKIN: No rash or itching.  CARDIOVASCULAR:  RESPIRATORY: No shortness of breath, cough or sputum.  GASTROINTESTINAL: No anorexia, nausea, vomiting or diarrhea. No abdominal pain or blood.  GENITOURINARY: No burning on urination, no polyuria NEUROLOGICAL: No headache, dizziness, syncope, paralysis, ataxia, numbness or tingling in the extremities. No change in bowel or bladder control.  MUSCULOSKELETAL: No muscle, back pain, joint pain or stiffness.  LYMPHATICS: No enlarged nodes. No history of splenectomy.  PSYCHIATRIC: No history of depression or anxiety.  ENDOCRINOLOGIC: No reports of sweating, cold or heat intolerance. No polyuria or polydipsia.  Marland Kitchen   Physical Examination There were no vitals filed for this visit. There were no vitals filed for this visit.  Gen: resting comfortably, no acute distress  HEENT: no scleral icterus, pupils equal round and reactive, no palptable cervical adenopathy,  CV Resp: Clear to auscultation bilaterally GI: abdomen is soft, non-tender, non-distended, normal bowel sounds, no hepatosplenomegaly MSK: extremities are warm, no edema.  Skin: warm, no rash Neuro:  no focal deficits Psych: appropriate affect  Diagnostic Studies     Assessment and Plan   1. Afib - mild infrequent palpitatinos. Try increasing dilt to 360mg  daily.  - not intersted in NOACs, continue coumadin  2. HTN - she is at goal, continue current meds  3. CAD - unclear history. This is mentioned in her previous cardiologists, I am not able to find any details - chronic over 10 year history of atypical chest pain - she is poor candidate for ischemic testing given prior CVA with left sided weakness, wheel chair bound, poor functional capacity. Symptosm remain atypical - will try increasing her dilt to 360mg  dialy for possible antianginal effects      Arnoldo Lenis, M.D., F.A.C.C.

## 2023-02-17 ENCOUNTER — Encounter: Payer: Self-pay | Admitting: *Deleted
